# Patient Record
Sex: Female | Born: 1950
Health system: Southern US, Community
[De-identification: ages and names within clinical notes are randomized; demographics above are authoritative.]

## PROBLEM LIST (undated history)

## (undated) DIAGNOSIS — H269 Unspecified cataract: Secondary | ICD-10-CM

## (undated) DIAGNOSIS — M1990 Unspecified osteoarthritis, unspecified site: Secondary | ICD-10-CM

## (undated) DIAGNOSIS — E785 Hyperlipidemia, unspecified: Secondary | ICD-10-CM

## (undated) DIAGNOSIS — E119 Type 2 diabetes mellitus without complications: Secondary | ICD-10-CM

## (undated) DIAGNOSIS — I10 Essential (primary) hypertension: Secondary | ICD-10-CM

## (undated) DIAGNOSIS — E669 Obesity, unspecified: Secondary | ICD-10-CM

## (undated) DIAGNOSIS — H5203 Hypermetropia, bilateral: Secondary | ICD-10-CM

## (undated) DIAGNOSIS — Z789 Other specified health status: Secondary | ICD-10-CM

## (undated) DIAGNOSIS — Z972 Presence of dental prosthetic device (complete) (partial): Secondary | ICD-10-CM

## (undated) DIAGNOSIS — IMO0001 Reserved for inherently not codable concepts without codable children: Secondary | ICD-10-CM

## (undated) HISTORY — PX: CHOLECYSTECTOMY: SHX55

## (undated) HISTORY — DX: Hypermetropia, bilateral: H52.03

## (undated) HISTORY — PX: GALLBLADDER SURGERY: SHX652

## (undated) HISTORY — DX: Obesity, unspecified: E66.9

## (undated) HISTORY — DX: Essential (primary) hypertension: I10

## (undated) HISTORY — PX: OTHER SURGICAL HISTORY: SHX169

## (undated) HISTORY — DX: Type 2 diabetes mellitus without complications: E11.9

## (undated) HISTORY — DX: Unspecified cataract: H26.9

## (undated) HISTORY — PX: TUBAL LIGATION: SHX77

## (undated) HISTORY — PX: TONSILLECTOMY: SUR1361

## (undated) HISTORY — DX: Hyperlipidemia, unspecified: E78.5

## (undated) HISTORY — PX: SEPTOPLASTY: SUR1290

---

## 2004-03-20 ENCOUNTER — Ambulatory Visit: Payer: Self-pay

## 2004-05-01 ENCOUNTER — Ambulatory Visit: Payer: Self-pay

## 2004-05-05 LAB — HM COLONOSCOPY

## 2005-04-03 ENCOUNTER — Ambulatory Visit: Payer: Self-pay | Admitting: Family Medicine

## 2006-07-08 ENCOUNTER — Ambulatory Visit: Payer: Self-pay

## 2007-07-13 ENCOUNTER — Ambulatory Visit: Payer: Self-pay | Admitting: Family Medicine

## 2007-09-15 ENCOUNTER — Ambulatory Visit: Payer: Self-pay

## 2007-09-24 ENCOUNTER — Ambulatory Visit: Payer: Self-pay | Admitting: Unknown Physician Specialty

## 2007-09-28 ENCOUNTER — Ambulatory Visit: Payer: Self-pay | Admitting: Unknown Physician Specialty

## 2007-11-12 ENCOUNTER — Ambulatory Visit: Payer: Self-pay

## 2007-12-15 ENCOUNTER — Ambulatory Visit: Payer: Self-pay | Admitting: Family Medicine

## 2007-12-21 ENCOUNTER — Ambulatory Visit: Payer: Self-pay | Admitting: General Surgery

## 2008-06-28 ENCOUNTER — Ambulatory Visit: Payer: Self-pay | Admitting: Family Medicine

## 2008-12-13 DIAGNOSIS — B354 Tinea corporis: Secondary | ICD-10-CM

## 2009-06-05 DIAGNOSIS — R809 Proteinuria, unspecified: Secondary | ICD-10-CM

## 2009-06-05 DIAGNOSIS — N183 Chronic kidney disease, stage 3 unspecified: Secondary | ICD-10-CM | POA: Insufficient documentation

## 2009-06-05 DIAGNOSIS — E1129 Type 2 diabetes mellitus with other diabetic kidney complication: Secondary | ICD-10-CM

## 2009-07-05 ENCOUNTER — Ambulatory Visit: Payer: Self-pay | Admitting: Family Medicine

## 2009-12-04 ENCOUNTER — Ambulatory Visit: Payer: Self-pay | Admitting: Unknown Physician Specialty

## 2010-06-26 LAB — HM DEXA SCAN: HM DEXA SCAN: NORMAL

## 2010-07-10 ENCOUNTER — Ambulatory Visit: Payer: Self-pay | Admitting: General Practice

## 2011-07-15 ENCOUNTER — Ambulatory Visit: Payer: Self-pay | Admitting: Family Medicine

## 2012-07-27 ENCOUNTER — Ambulatory Visit: Payer: Self-pay | Admitting: Medical

## 2012-10-15 DIAGNOSIS — R809 Proteinuria, unspecified: Secondary | ICD-10-CM | POA: Insufficient documentation

## 2013-08-10 ENCOUNTER — Ambulatory Visit: Payer: Self-pay | Admitting: Medical

## 2013-08-10 LAB — HM MAMMOGRAPHY: HM Mammogram: NORMAL

## 2013-10-17 LAB — HM PAP SMEAR: HM Pap smear: NORMAL

## 2014-05-17 LAB — LIPID PANEL
Cholesterol: 175 mg/dL (ref 0–200)
HDL: 46 mg/dL (ref 35–70)
LDL CALC: 106 mg/dL
Triglycerides: 115 mg/dL (ref 40–160)

## 2014-08-16 ENCOUNTER — Ambulatory Visit
Admit: 2014-08-16 | Disposition: A | Discharge status: Home / Self Care | Payer: Self-pay | Attending: Medical | Admitting: Medical

## 2014-08-18 LAB — HEMOGLOBIN A1C: Hgb A1c MFr Bld: 6.5 % — AB (ref 4.0–6.0)

## 2014-10-12 ENCOUNTER — Telehealth: Payer: Self-pay

## 2014-10-12 NOTE — Telephone Encounter (Signed)
Per pt, her doctor has requested she wait until September.

## 2014-10-12 NOTE — Telephone Encounter (Signed)
-----   Message from Yvonne Curtis sent at 10/09/2014 10:18 AM EDT ----- Regarding: Fwd: Re: Referral - APPDATE - TIME - PROCEDURE From: Yvonne Curtis Sent: 10/06/2014  > From: Yvonne Curtis > To: Yvonne Curtis > Sent: 10/04/2014 9:05 AM > Call pt to schedule colonoscopy. Pt requested summer. GF  > From: Yvonne Curtis > To: Yvonne Curtis > Sent: 05/19/2014 9:05 AM > Spoke with pt. Would like a call back during the summer. Added to recall list to send letter. GF  > From: Curtis, Yvonne > To: Colonoscopy, Triage > Sent: 05/17/2014 9:29 AM > Please call for triage.

## 2014-11-14 ENCOUNTER — Telehealth: Payer: Self-pay

## 2014-11-14 ENCOUNTER — Other Ambulatory Visit: Payer: Self-pay

## 2014-11-14 ENCOUNTER — Encounter: Payer: Self-pay | Admitting: Family Medicine

## 2014-11-14 DIAGNOSIS — E785 Hyperlipidemia, unspecified: Secondary | ICD-10-CM | POA: Insufficient documentation

## 2014-11-14 DIAGNOSIS — M170 Bilateral primary osteoarthritis of knee: Secondary | ICD-10-CM | POA: Insufficient documentation

## 2014-11-14 DIAGNOSIS — L83 Acanthosis nigricans: Secondary | ICD-10-CM | POA: Insufficient documentation

## 2014-11-14 DIAGNOSIS — K219 Gastro-esophageal reflux disease without esophagitis: Secondary | ICD-10-CM | POA: Insufficient documentation

## 2014-11-14 DIAGNOSIS — I10 Essential (primary) hypertension: Secondary | ICD-10-CM | POA: Insufficient documentation

## 2014-11-14 DIAGNOSIS — E668 Other obesity: Secondary | ICD-10-CM | POA: Insufficient documentation

## 2014-11-14 NOTE — Telephone Encounter (Signed)
Gastroenterology Pre-Procedure Review  Request Date: 11-30-14 Requesting Physician: Dr. Ancil Boozer  PATIENT REVIEW QUESTIONS: The patient responded to the following health history questions as indicated:    1. Are you having any GI issues? no 2. Do you have a personal history of Polyps? no 3. Do you have a family history of Colon Cancer or Polyps? yes (mother colon cancer) 4. Diabetes Mellitus? yes (Type 2) 5. Joint replacements in the past 12 months?no 6. Major health problems in the past 3 months?no 7. Any artificial heart valves, MVP, or defibrillator?no    MEDICATIONS & ALLERGIES:    Patient reports the following regarding taking any anticoagulation/antiplatelet therapy:   Plavix, Coumadin, Eliquis, Xarelto, Lovenox, Pradaxa, Brilinta, or Effient? no Aspirin? yes (81mg )  Patient confirms/reports the following medications:  Current Outpatient Prescriptions  Medication Sig Dispense Refill  . acetaminophen (TYLENOL) 500 MG tablet Take by mouth.    Marland Kitchen amLODipine (NORVASC) 10 MG tablet Take by mouth.    Marland Kitchen atorvastatin (LIPITOR) 40 MG tablet Take by mouth.    . Cholecalciferol (VITAMIN D3) 2000 UNITS capsule VITAMIN D3, 2000UNIT (Oral Capsule)  2 po qday for 0 days  Quantity: 60.00;  Refills: 0   Ordered :15-May-2010  Steele Sizer MD;  Buddy Duty 05-October-2008 Active    . fluticasone (FLONASE) 50 MCG/ACT nasal spray Place into the nose.    Marland Kitchen glucose blood (NOVA MAX TEST) test strip NOVA MAX GLUCOSE TEST (In Vitro Strip)  check fsbs qam for 0 days  Quantity: 100.00;  Refills: 0   Ordered :15-May-2010  Steele Sizer MD;  Adele Barthel Active Comments: DX: 250.00    . metFORMIN (GLUCOPHAGE) 1000 MG tablet Take by mouth.    . telmisartan-hydrochlorothiazide (MICARDIS HCT) 80-25 MG per tablet Take by mouth.     No current facility-administered medications for this visit.    Patient confirms/reports the following allergies:  Allergies  Allergen Reactions  . Oxycodone Hcl    insomnia, agitation    No orders of the defined types were placed in this encounter.    AUTHORIZATION INFORMATION Primary Insurance: 1D#: Group #:  Secondary Insurance: 1D#: Group #:  SCHEDULE INFORMATION: Date: 11-30-14 Time: Location: [facility]

## 2014-11-17 ENCOUNTER — Encounter: Payer: Self-pay | Admitting: Family Medicine

## 2014-11-17 ENCOUNTER — Ambulatory Visit (INDEPENDENT_AMBULATORY_CARE_PROVIDER_SITE_OTHER): Payer: BLUE CROSS/BLUE SHIELD | Admitting: Family Medicine

## 2014-11-17 VITALS — BP 118/64 | HR 100 | Temp 98.6°F | Resp 16 | Ht 61.0 in | Wt 222.4 lb

## 2014-11-17 DIAGNOSIS — I10 Essential (primary) hypertension: Secondary | ICD-10-CM | POA: Diagnosis not present

## 2014-11-17 DIAGNOSIS — E1129 Type 2 diabetes mellitus with other diabetic kidney complication: Secondary | ICD-10-CM

## 2014-11-17 DIAGNOSIS — E668 Other obesity: Secondary | ICD-10-CM

## 2014-11-17 DIAGNOSIS — E785 Hyperlipidemia, unspecified: Secondary | ICD-10-CM

## 2014-11-17 DIAGNOSIS — E1121 Type 2 diabetes mellitus with diabetic nephropathy: Secondary | ICD-10-CM

## 2014-11-17 DIAGNOSIS — R809 Proteinuria, unspecified: Secondary | ICD-10-CM | POA: Diagnosis not present

## 2014-11-17 LAB — POCT UA - MICROALBUMIN: MICROALBUMIN (UR) POC: 20 mg/L

## 2014-11-17 LAB — GLUCOSE, POCT (MANUAL RESULT ENTRY): POC GLUCOSE: 103 mg/dL — AB (ref 70–99)

## 2014-11-17 LAB — POCT GLYCOSYLATED HEMOGLOBIN (HGB A1C): HEMOGLOBIN A1C: 6.1

## 2014-11-17 MED ORDER — AMLODIPINE BESYLATE 5 MG PO TABS
10.0000 mg | ORAL_TABLET | Freq: Every day | ORAL | Status: DC
Start: 2014-11-17 — End: 2015-01-11

## 2014-11-17 MED ORDER — ATORVASTATIN CALCIUM 40 MG PO TABS: 40.0000 mg | ORAL_TABLET | Freq: Every day | ORAL | Status: DC

## 2014-11-17 MED ORDER — TELMISARTAN-HCTZ 80-25 MG PO TABS: 1.0000 | ORAL_TABLET | Freq: Every day | ORAL | Status: DC

## 2014-11-17 MED ORDER — METFORMIN HCL 500 MG PO TABS: 500.0000 mg | ORAL_TABLET | Freq: Every evening | ORAL | Status: DC

## 2014-11-17 NOTE — Patient Instructions (Signed)
Take only 5 mg of Amlodipine and monitor bp at home to make sure it is not going up

## 2014-11-17 NOTE — Progress Notes (Signed)
Name: Yvonne Curtis   MRN: [id number]    DOB: April 13, 1951   Date:11/17/2014       Progress Note  Subjective  Chief Complaint  Chief Complaint  Patient presents with  . Hypertension  . Diabetes  . Hyperlipidemia  . Obesity    HPI  DMII with renal manifestation: she has been taking Metformin daily and denies side effects, glucose at home is around 100's in the mornings. She denies polyphagia, polyuria or polydipsia.  She is not up to date with eye exam, urine micro today within normal limits  HTN: she has been taking her medications as prescribed and denies side effects. BP today towards low end of normal with slight tachycardia, no dizziness or syncope.  No chest pain or palpitation  Hyperlipidemia: last labs were done 6 months ago. Taking Lipitor and denies side effects  Obesity: she was [place] but because of cost only taking prn , and will stop once current bottle runs out. Discussed life style modification  Patient Active Problem List   Diagnosis Date Noted  . Acanthosis nigricans 11/14/2014  . Benign hypertension 11/14/2014  . Dyslipidemia 11/14/2014  . Gastro-esophageal reflux disease without esophagitis 11/14/2014  . Extreme obesity 11/14/2014  . Primary osteoarthritis of both knees 11/14/2014  . Microalbuminuria 10/15/2012  . Diabetes mellitus with renal manifestations, controlled 06/05/2009  . Body dermatophytosis 12/13/2008    Past Surgical History  Procedure Laterality Date  . Tubal ligation    . Septoplasty    . Tonsillectomy    . Gallbladder surgery      Family History  Problem Relation Age of Onset  . Colon cancer Mother   . Lung cancer Father   . Hypertension Sister   . Hypertension Sister     History   Social History  . Marital Status: Married    Spouse Name: N/A  . Number of Children: N/A  . Years of Education: N/A   Occupational History  . Not on file.   Social History Main Topics  . Smoking status: Never Smoker   . Smokeless  tobacco: Not on file  . Alcohol Use: No  . Drug Use: No  . Sexual Activity: Not on file   Other Topics Concern  . Not on file   Social History Narrative  . No narrative on file     Current outpatient prescriptions:  .  acetaminophen (TYLENOL) 500 MG tablet, Take by mouth., Disp: , Rfl:  .  amLODipine (NORVASC) 5 MG tablet, Take 2 tablets (10 mg total) by mouth daily., Disp: 90 tablet, Rfl: 1 .  aspirin EC 81 MG tablet, Take by mouth., Disp: , Rfl:  .  atorvastatin (LIPITOR) 40 MG tablet, Take 1 tablet (40 mg total) by mouth daily., Disp: 90 tablet, Rfl: 1 .  Cholecalciferol (VITAMIN D3) 2000 UNITS capsule, VITAMIN D3, 2000UNIT (Oral Capsule)  2 po qday for 0 days  Quantity: 60.00;  Refills: 0   Ordered :15-May-2010  Steele Sizer MD;  Started 05-October-2008 Active, Disp: , Rfl:  .  fluticasone (FLONASE) 50 MCG/ACT nasal spray, Place into the nose., Disp: , Rfl:  .  glucose blood (NOVA MAX TEST) test strip, NOVA MAX GLUCOSE TEST (In Vitro Strip)  check fsbs qam for 0 days  Quantity: 100.00;  Refills: 0   Ordered :15-May-2010  Steele Sizer MD;  Adele Barthel Active Comments: DX: 250.00, Disp: , Rfl:  .  metFORMIN (GLUCOPHAGE) 500 MG tablet, Take 1 tablet (500 mg total) by mouth every evening.,  Disp: 90 tablet, Rfl: 1 .  telmisartan-hydrochlorothiazide (MICARDIS HCT) 80-25 MG per tablet, Take 1 tablet by mouth daily., Disp: 90 tablet, Rfl: 1  Allergies  Allergen Reactions  . Oxycodone Other (See Comments)  . Oxycodone Hcl     insomnia, agitation     ROS  Constitutional: Negative for fever or weight change.  Respiratory: Negative for cough and shortness of breath.   Cardiovascular: Negative for chest pain or palpitations.  Gastrointestinal: Negative for abdominal pain, no bowel changes.  Musculoskeletal: Negative for gait problem or joint swelling.  Skin: Negative for rash.  Neurological: Negative for dizziness or headache.  No other specific complaints in a complete  review of systems (except as listed in HPI above).  Objective  Filed Vitals:   11/17/14 0854  BP: 118/64  Pulse: 100  Temp: 98.6 F (37 C)  Resp: 16  Height: 5\' 1"  (1.549 m)  Weight: 222 lb 6 oz (100.869 kg)  SpO2: 94%    Body mass index is 42.04 kg/(m^2).  Physical Exam  Constitutional: Patient appears well-developed and well-nourished. No distress. Obesity Eyes:  No scleral icterus. PERL Neck: Normal range of motion. Neck supple. Cardiovascular: Normal rate, regular rhythm and normal heart sounds.  No murmur heard. Trace  BLE edema. Pulmonary/Chest: Effort normal and breath sounds normal. No respiratory distress. Abdominal: Soft.  There is no tenderness. Psychiatric: Patient has a normal mood and affect. behavior is normal. Judgment and thought content normal.  Recent Results (from the past 2160 hour(s))  POCT Glucose (CBG)     Status: Abnormal   Collection Time: 11/17/14  9:04 AM  Result Value Ref Range   POC Glucose 103 (A) 70 - 99 mg/dl  POCT HgB A1C     Status: None   Collection Time: 11/17/14  9:04 AM  Result Value Ref Range   Hemoglobin A1C 6.1   POCT UA - Microalbumin     Status: None   Collection Time: 11/17/14  9:04 AM  Result Value Ref Range   Microalbumin Ur, POC 20 mg/L   Creatinine, POC  mg/dL   Albumin/Creatinine Ratio, Urine, POC      Diabetic Foot Exam: Diabetic Foot Exam - Simple   Simple Foot Form  Visual Inspection  No deformities, no ulcerations, no other skin breakdown bilaterally:  Yes  Sensation Testing  Intact to touch and monofilament testing bilaterally:  Yes  Pulse Check  Posterior Tibialis and Dorsalis pulse intact bilaterally:  Yes  Comments       PHQ2/9: Depression screen PHQ 2/9 11/17/2014  Decreased Interest 0  Down, Depressed, Hopeless 0  PHQ - 2 Score 0    Fall Risk: Fall Risk  11/17/2014  Falls in the past year? No      Assessment & Plan  1. Diabetes mellitus with renal manifestations, controlled  - POCT  Glucose (CBG) - POCT HgB A1C - POCT UA - Microalbumin - telmisartan-hydrochlorothiazide (MICARDIS HCT) 80-25 MG per tablet; Take 1 tablet by mouth daily.  Dispense: 90 tablet; Refill: 1  2. Microalbuminuria within normal limits   3. Dyslipidemia Continue medication  - atorvastatin (LIPITOR) 40 MG tablet; Take 1 tablet (40 mg total) by mouth daily.  Dispense: 90 tablet; Refill: 1  4. Benign hypertension  - amLODipine (NORVASC) 5 MG tablet; Take 2 tablets (10 mg total) by mouth daily.  Dispense: 90 tablet; Refill: 1 - metFORMIN (GLUCOPHAGE) 500 MG tablet; Take 1 tablet (500 mg total) by mouth every evening.  Dispense: 90 tablet; Refill:  1 Changed dose of Norvasc because bp is towards low end of normal , monitor at home and call back if bp goes up  5. Extreme obesity Explained that she needs to change her diet to lose weight. She still eats sweets and drinks sweet tea daily. Advised to stop drinking sweet beverages

## 2014-11-20 ENCOUNTER — Encounter: Payer: Self-pay | Admitting: *Deleted

## 2014-11-29 NOTE — Discharge Instructions (Signed)

## 2014-11-30 ENCOUNTER — Ambulatory Visit: Payer: BLUE CROSS/BLUE SHIELD | Admitting: Anesthesiology

## 2014-11-30 ENCOUNTER — Encounter
Admission: RE | Disposition: A | Discharge status: Home / Self Care | Payer: Self-pay | Source: Ambulatory Visit | Attending: Gastroenterology

## 2014-11-30 ENCOUNTER — Ambulatory Visit
Admission: RE | Admit: 2014-11-30 | Discharge: 2014-11-30 | Disposition: A | Discharge status: Home / Self Care | Payer: BLUE CROSS/BLUE SHIELD | Source: Ambulatory Visit | Attending: Gastroenterology | Admitting: Gastroenterology

## 2014-11-30 ENCOUNTER — Other Ambulatory Visit: Payer: Self-pay | Admitting: Gastroenterology

## 2014-11-30 DIAGNOSIS — Z800 Family history of malignant neoplasm of digestive organs: Secondary | ICD-10-CM | POA: Insufficient documentation

## 2014-11-30 DIAGNOSIS — Z9049 Acquired absence of other specified parts of digestive tract: Secondary | ICD-10-CM | POA: Diagnosis not present

## 2014-11-30 DIAGNOSIS — E119 Type 2 diabetes mellitus without complications: Secondary | ICD-10-CM | POA: Diagnosis not present

## 2014-11-30 DIAGNOSIS — D124 Benign neoplasm of descending colon: Secondary | ICD-10-CM | POA: Diagnosis not present

## 2014-11-30 DIAGNOSIS — M13862 Other specified arthritis, left knee: Secondary | ICD-10-CM | POA: Diagnosis not present

## 2014-11-30 DIAGNOSIS — Z9889 Other specified postprocedural states: Secondary | ICD-10-CM | POA: Insufficient documentation

## 2014-11-30 DIAGNOSIS — M13861 Other specified arthritis, right knee: Secondary | ICD-10-CM | POA: Diagnosis not present

## 2014-11-30 DIAGNOSIS — Z7951 Long term (current) use of inhaled steroids: Secondary | ICD-10-CM | POA: Insufficient documentation

## 2014-11-30 DIAGNOSIS — Z8249 Family history of ischemic heart disease and other diseases of the circulatory system: Secondary | ICD-10-CM | POA: Insufficient documentation

## 2014-11-30 DIAGNOSIS — D123 Benign neoplasm of transverse colon: Secondary | ICD-10-CM | POA: Insufficient documentation

## 2014-11-30 DIAGNOSIS — Z79899 Other long term (current) drug therapy: Secondary | ICD-10-CM | POA: Diagnosis not present

## 2014-11-30 DIAGNOSIS — Z7982 Long term (current) use of aspirin: Secondary | ICD-10-CM | POA: Diagnosis not present

## 2014-11-30 DIAGNOSIS — E785 Hyperlipidemia, unspecified: Secondary | ICD-10-CM | POA: Diagnosis not present

## 2014-11-30 DIAGNOSIS — Z885 Allergy status to narcotic agent status: Secondary | ICD-10-CM | POA: Diagnosis not present

## 2014-11-30 DIAGNOSIS — Z1211 Encounter for screening for malignant neoplasm of colon: Secondary | ICD-10-CM | POA: Diagnosis not present

## 2014-11-30 DIAGNOSIS — K640 First degree hemorrhoids: Secondary | ICD-10-CM | POA: Insufficient documentation

## 2014-11-30 DIAGNOSIS — Z801 Family history of malignant neoplasm of trachea, bronchus and lung: Secondary | ICD-10-CM | POA: Diagnosis not present

## 2014-11-30 DIAGNOSIS — I10 Essential (primary) hypertension: Secondary | ICD-10-CM | POA: Insufficient documentation

## 2014-11-30 HISTORY — DX: Unspecified osteoarthritis, unspecified site: M19.90

## 2014-11-30 HISTORY — DX: Other specified health status: Z78.9

## 2014-11-30 HISTORY — DX: Presence of dental prosthetic device (complete) (partial): Z97.2

## 2014-11-30 HISTORY — PX: COLONOSCOPY WITH PROPOFOL: SHX5780

## 2014-11-30 HISTORY — DX: Reserved for inherently not codable concepts without codable children: IMO0001

## 2014-11-30 LAB — GLUCOSE, CAPILLARY
GLUCOSE-CAPILLARY: 87 mg/dL (ref 65–99)
Glucose-Capillary: 78 mg/dL (ref 65–99)

## 2014-11-30 SURGERY — COLONOSCOPY WITH PROPOFOL
Anesthesia: Monitor Anesthesia Care | Wound class: Contaminated

## 2014-11-30 MED ORDER — LACTATED RINGERS IV SOLN
INTRAVENOUS | Status: DC
  Administered 2014-11-30: 07:00:00 via INTRAVENOUS

## 2014-11-30 MED ORDER — PROPOFOL 10 MG/ML IV BOLUS
INTRAVENOUS | Status: DC | PRN
  Administered 2014-11-30: 50 mg via INTRAVENOUS
  Administered 2014-11-30: 20 mg via INTRAVENOUS
  Administered 2014-11-30 (×2): 30 mg via INTRAVENOUS
  Administered 2014-11-30 (×2): 10 mg via INTRAVENOUS

## 2014-11-30 MED ORDER — LIDOCAINE HCL (CARDIAC) 20 MG/ML IV SOLN
INTRAVENOUS | Status: DC | PRN
  Administered 2014-11-30: 50 mg via INTRAVENOUS

## 2014-11-30 MED ORDER — SODIUM CHLORIDE 0.9 % IV SOLN: INTRAVENOUS | Status: DC

## 2014-11-30 MED ORDER — STERILE WATER FOR IRRIGATION IR SOLN
Status: DC | PRN
  Administered 2014-11-30: 08:00:00

## 2014-11-30 SURGICAL SUPPLY — 28 items
CANISTER SUCT 1200ML W/VALVE (MISCELLANEOUS) ×3 IMPLANT
FCP ESCP3.2XJMB 240X2.8X (MISCELLANEOUS)
FORCEPS BIOP RAD 4 LRG CAP 4 (CUTTING FORCEPS) ×3 IMPLANT
FORCEPS BIOP RJ4 240 W/NDL (MISCELLANEOUS)
FORCEPS ESCP3.2XJMB 240X2.8X (MISCELLANEOUS) IMPLANT
GOWN CVR UNV OPN BCK APRN NK (MISCELLANEOUS) ×2 IMPLANT
GOWN ISOL THUMB LOOP REG UNIV (MISCELLANEOUS) ×4
HEMOCLIP INSTINCT (CLIP) IMPLANT
INJECTOR VARIJECT VIN23 (MISCELLANEOUS) IMPLANT
KIT CO2 TUBING (TUBING) IMPLANT
KIT DEFENDO VALVE AND CONN (KITS) IMPLANT
KIT ENDO PROCEDURE OLY (KITS) ×3 IMPLANT
LIGATOR MULTIBAND 6SHOOTER MBL (MISCELLANEOUS) IMPLANT
MARKER SPOT ENDO TATTOO 5ML (MISCELLANEOUS) IMPLANT
PAD GROUND ADULT SPLIT (MISCELLANEOUS) IMPLANT
SNARE SHORT THROW 13M SML OVAL (MISCELLANEOUS) IMPLANT
SNARE SHORT THROW 30M LRG OVAL (MISCELLANEOUS) IMPLANT
SPOT EX ENDOSCOPIC TATTOO (MISCELLANEOUS)
SUCTION POLY TRAP 4CHAMBER (MISCELLANEOUS) IMPLANT
TRAP SUCTION POLY (MISCELLANEOUS) IMPLANT
TUBING CONN 6MMX3.1M (TUBING)
TUBING SUCTION CONN 0.25 STRL (TUBING) IMPLANT
UNDERPAD 30X60 958B10 (PK) (MISCELLANEOUS) IMPLANT
VALVE BIOPSY ENDO (VALVE) IMPLANT
VARIJECT INJECTOR VIN23 (MISCELLANEOUS)
WATER AUXILLARY (MISCELLANEOUS) IMPLANT
WATER STERILE IRR 250ML POUR (IV SOLUTION) ×3 IMPLANT
WATER STERILE IRR 500ML POUR (IV SOLUTION) IMPLANT

## 2014-11-30 NOTE — Anesthesia Postprocedure Evaluation (Signed)
  Anesthesia Post-op Note  Patient: Yvonne Curtis  Procedure(s) Performed: Procedure(s) with comments: COLONOSCOPY WITH PROPOFOL (N/A) - WITH BIOPSY-- TRANSVERSE COLON POLYP  X 2 DESCENING COLON POLYP  Anesthesia type:MAC  Patient location: PACU  Post pain: Pain level controlled  Post assessment: Post-op Vital signs reviewed, Patient's Cardiovascular Status Stable, Respiratory Function Stable, Patent Airway and No signs of Nausea or vomiting  Post vital signs: Reviewed and stable  Last Vitals:  Filed Vitals:   11/30/14 0832  BP: 132/73  Pulse: 72  Temp: 36.3 C  Resp: 16    Level of consciousness: awake, alert  and patient cooperative  Complications: No apparent anesthesia complications

## 2014-11-30 NOTE — Anesthesia Procedure Notes (Signed)
Procedure Name: MAC Performed by: Ramon Zanders Pre-anesthesia Checklist: Patient identified, Emergency Drugs available, Suction available, Timeout performed and Patient being monitored Patient Re-evaluated:Patient Re-evaluated prior to inductionOxygen Delivery Method: Nasal cannula Placement Confirmation: positive ETCO2       

## 2014-11-30 NOTE — H&P (Signed)
[facility]  [address] [place], [place] [address] Phone: [phone] Fax : [phone]  Primary Care Physician:  Loistine Chance, MD Primary Gastroenterologist:  Dr. Allen Norris  Pre-Procedure History & Physical: HPI:  Yvonne Curtis is a 64 y.o. female is here for a screening colonoscopy.   Past Medical History  Diagnosis Date  . Hyperlipidemia   . Hypertension   . Diabetes mellitus without complication   . Obesity   . Arthritis     knees  . Wears dentures     full upper  . Patient is Jehov[employer]'s Witness     declines blood products    Past Surgical History  Procedure Laterality Date  . Tubal ligation    . Septoplasty    . Tonsillectomy    . Gallbladder surgery      Prior to Admission medications   Medication Sig Start Date End Date Taking? Authorizing Provider  acetaminophen (TYLENOL) 500 MG tablet Take by mouth. 10/15/12  Yes Historical Provider, MD  amLODipine (NORVASC) 5 MG tablet Take 2 tablets (10 mg total) by mouth daily. 11/17/14  Yes Steele Sizer, MD  aspirin EC 81 MG tablet Take by mouth.   Yes Historical Provider, MD  atorvastatin (LIPITOR) 40 MG tablet Take 1 tablet (40 mg total) by mouth daily. 11/17/14  Yes Steele Sizer, MD  Cholecalciferol (VITAMIN D3) 2000 UNITS capsule VITAMIN D3, 2000UNIT (Oral Capsule)  2 po qday for 0 days  Quantity: 60.00;  Refills: 0   Ordered :15-May-2010  Steele Sizer MD;  Started 05-October-2008 Active 10/05/08  Yes Historical Provider, MD  fluticasone (FLONASE) 50 MCG/ACT nasal spray Place into the nose. 07/17/14  Yes Historical Provider, MD  metFORMIN (GLUCOPHAGE) 500 MG tablet Take 1 tablet (500 mg total) by mouth every evening. 11/17/14  Yes Steele Sizer, MD  telmisartan-hydrochlorothiazide (MICARDIS HCT) 80-25 MG per tablet Take 1 tablet by mouth daily. 11/17/14  Yes Steele Sizer, MD  glucose blood (NOVA MAX TEST) test strip NOVA MAX GLUCOSE TEST (In Vitro Strip)  check fsbs qam for 0 days  Quantity: 100.00;  Refills: 0   Ordered :15-May-2010  Steele Sizer MD;  Buddy Duty 05-Jun-2009 Active Comments: DX: 250.00 06/05/09   Historical Provider, MD    Allergies as of 11/14/2014 - never reviewed  Allergen Reaction Noted  . Oxycodone hcl  11/14/2014    Family History  Problem Relation Age of Onset  . Colon cancer Mother   . Lung cancer Father   . Hypertension Sister   . Hypertension Sister     History   Social History  . Marital Status: Married    Spouse Name: N/A  . Number of Children: N/A  . Years of Education: N/A   Occupational History  . Not on file.   Social History Main Topics  . Smoking status: Never Smoker   . Smokeless tobacco: Not on file  . Alcohol Use: No  . Drug Use: No  . Sexual Activity: Not on file   Other Topics Concern  . Not on file   Social History Narrative    Review of Systems: See HPI, otherwise negative ROS  Physical Exam: BP 141/78 mmHg  Pulse 89  Temp(Src) 97.9 F (36.6 C) (Temporal)  Resp 18  Ht 5' (1.524 m)  Wt 222 lb (100.699 kg)  BMI 43.36 kg/m2  SpO2 98% General:   Alert,  pleasant and cooperative in NAD Head:  Normocephalic and atraumatic. Neck:  Supple; no masses or thyromegaly. Lungs:  Clear throughout to auscultation.    Heart:  Regular rate and rhythm. Abdomen:  Soft, nontender and nondistended. Normal bowel sounds, without guarding, and without rebound.   Neurologic:  Alert and  oriented x4;  grossly normal neurologically.  Impression/Plan: Yvonne Curtis is now here to undergo a screening colonoscopy.  Risks, benefits, and alternatives regarding colonoscopy have been reviewed with the patient.  Questions have been answered.  All parties agreeable.

## 2014-11-30 NOTE — Transfer of Care (Signed)
Immediate Anesthesia Transfer of Care Note  Patient: Yvonne Curtis  Procedure(s) Performed: Procedure(s) with comments: COLONOSCOPY WITH PROPOFOL (N/A) - WITH BIOPSY-- TRANSVERSE COLON POLYP  X 2 [place] COLON POLYP  Patient Location: PACU  Anesthesia Type: MAC  Level of Consciousness: awake, alert  and patient cooperative  Airway and Oxygen Therapy: Patient Spontanous Breathing and Patient connected to supplemental oxygen  Post-op Assessment: Post-op Vital signs reviewed, Patient's Cardiovascular Status Stable, Respiratory Function Stable, Patent Airway and No signs of Nausea or vomiting  Post-op Vital Signs: Reviewed and stable  Complications: No apparent anesthesia complications

## 2014-11-30 NOTE — Anesthesia Preprocedure Evaluation (Signed)
Anesthesia Evaluation  Patient identified by MRN, date of birth, ID band Patient awake    Reviewed: Allergy & Precautions, H&P , NPO status , Patient's Chart, lab work & pertinent test results, reviewed documented beta blocker date and time   Airway Mallampati: II  TM Distance: >3 FB Neck ROM: full    Dental no notable dental hx. (+) Upper Dentures   Pulmonary neg pulmonary ROS,  breath sounds clear to auscultation  Pulmonary exam normal       Cardiovascular Exercise Tolerance: Good hypertension, Rhythm:regular Rate:Normal     Neuro/Psych negative neurological ROS  negative psych ROS   GI/Hepatic Neg liver ROS, GERD-  ,  Endo/Other  diabetes, Oral Hypoglycemic Agents  Renal/GU negative Renal ROS  negative genitourinary   Musculoskeletal   Abdominal   Peds  Hematology  (+) [facility]'S WITNESS  Anesthesia Other Findings   Reproductive/Obstetrics negative OB ROS                             Anesthesia Physical Anesthesia Plan  ASA: III  Anesthesia Plan: MAC   Post-op Pain Management:    Induction:   Airway Management Planned:   Additional Equipment:   Intra-op Plan:   Post-operative Plan:   Informed Consent: I have reviewed the patients History and Physical, chart, labs and discussed the procedure including the risks, benefits and alternatives for the proposed anesthesia with the patient or authorized representative who has indicated his/her understanding and acceptance.     Plan Discussed with: CRNA  Anesthesia Plan Comments:         Anesthesia Quick Evaluation

## 2014-11-30 NOTE — Op Note (Signed)
[facility]astroenter[facility] Patient Name: Yvonne Curtis Procedure Date: 11/30/2014 8:07 AM MRN: [id number] Account #: [id number] Date of Birth: 03-15-1951 Admit Type: Outpatient Age: 64 Room: [facility] OR ROOM 01 Gender: Female Note Status: Finalized Procedure:         Colonoscopy Indications:       Screening for colorectal malignant neoplasm Providers:         Lucilla Lame, MD Referring MD:      Bethena Roys. Sowles, MD (Referring MD) Medicines:         Propofol per Anesthesia Complications:     No immediate complications. Procedure:         Pre-Anesthesia Assessment:                    - Prior to the procedure, a History and Physical was                     performed, and patient medications and allergies were                     reviewed. The patient's tolerance of previous anesthesia                     was also reviewed. The risks and benefits of the procedure                     and the sedation options and risks were discussed with the                     patient. All questions were answered, and informed consent                     was obtained. Prior Anticoagulants: The patient has taken                     no previous anticoagulant or antiplatelet agents. ASA                     Grade Assessment: II - A patient with mild systemic                     disease. After reviewing the risks and benefits, the                     patient was deemed in satisfactory condition to undergo                     the procedure.                    After obtaining informed consent, the colonoscope was                     passed under direct vision. Throughout the procedure, the                     patient's blood pressure, pulse, and oxygen saturations                     were monitored continuously. The Olympus CF H180AL                     colonoscope (S#: [id number]) was introduced through the anus  and advanced to the the cecum, identified by appendiceal                   orifice and ileocecal valve. The colonoscopy was performed                     without difficulty. The patient tolerated the procedure                     well. The quality of the bowel preparation was good. Findings:      The perianal and digital rectal examinations were normal.      Two sessile polyps were found in the transverse colon. The polyps were 3       to 4 mm in size. These polyps were removed with a cold biopsy forceps.       Resection and retrieval were complete.      A 4 mm polyp was found in the descending colon. The polyp was sessile.       The polyp was removed with a cold biopsy forceps. Resection and       retrieval were complete.      Non-bleeding internal hemorrhoids were found during retroflexion. The       hemorrhoids were Grade I (internal hemorrhoids that do not prolapse). Impression:        - Two 3 to 4 mm polyps in the transverse colon. Resected                     and retrieved.                    - One 4 mm polyp in the descending colon. Resected and                     retrieved.                    - Non-bleeding internal hemorrhoids. Recommendation:    - Await pathology results.                    - Repeat colonoscopy in 5 years if polyp adenoma and 10                     years if hyperplastic Procedure Code(s): --- Professional ---                    [phone], Colonoscopy, flexible; with biopsy, single or                     multiple Diagnosis Code(s): --- Professional ---                    Z12.11, Encounter for screening for malignant neoplasm of                     colon                    D12.3, Benign neoplasm of transverse colon                    D12.4, Benign neoplasm of descending colon CPT copyright 2014 American Medical Association. All rights reserved. The codes documented in this report are preliminary and upon coder review may  be revised to meet current compliance requirements. Lucilla Lame, MD 11/30/2014 8:30:38 AM This  report has  been signed electronically. Number of Addenda: 0 Note Initiated On: 11/30/2014 8:07 AM Scope Withdrawal Time: 0 hours 7 minutes 21 seconds  Total Procedure Duration: 0 hours 10 minutes 2 seconds       [facility]

## 2014-12-01 ENCOUNTER — Encounter: Payer: Self-pay | Admitting: Gastroenterology

## 2014-12-05 ENCOUNTER — Encounter: Payer: Self-pay | Admitting: Gastroenterology

## 2014-12-29 ENCOUNTER — Other Ambulatory Visit: Payer: Self-pay | Admitting: Family Medicine

## 2015-01-10 ENCOUNTER — Telehealth: Payer: Self-pay | Admitting: Family Medicine

## 2015-01-10 NOTE — Telephone Encounter (Signed)
NEEDS LAB SLIP

## 2015-01-10 NOTE — Telephone Encounter (Signed)
Labs

## 2015-01-11 ENCOUNTER — Other Ambulatory Visit: Payer: Self-pay

## 2015-01-11 DIAGNOSIS — I10 Essential (primary) hypertension: Secondary | ICD-10-CM

## 2015-01-11 MED ORDER — AMLODIPINE BESYLATE 10 MG PO TABS: 10.0000 mg | ORAL_TABLET | Freq: Every day | ORAL | Status: DC

## 2015-01-12 LAB — HM DIABETES EYE EXAM

## 2015-01-17 ENCOUNTER — Encounter: Payer: Self-pay | Admitting: Family Medicine

## 2015-01-17 ENCOUNTER — Ambulatory Visit (INDEPENDENT_AMBULATORY_CARE_PROVIDER_SITE_OTHER): Payer: PPO | Admitting: Family Medicine

## 2015-01-17 VITALS — BP 122/74 | HR 106 | Temp 98.1°F | Resp 16 | Ht 61.0 in | Wt 225.4 lb

## 2015-01-17 DIAGNOSIS — I10 Essential (primary) hypertension: Secondary | ICD-10-CM

## 2015-01-17 DIAGNOSIS — Z1239 Encounter for other screening for malignant neoplasm of breast: Secondary | ICD-10-CM

## 2015-01-17 DIAGNOSIS — Z13820 Encounter for screening for osteoporosis: Secondary | ICD-10-CM

## 2015-01-17 DIAGNOSIS — Z23 Encounter for immunization: Secondary | ICD-10-CM | POA: Diagnosis not present

## 2015-01-17 DIAGNOSIS — Z124 Encounter for screening for malignant neoplasm of cervix: Secondary | ICD-10-CM

## 2015-01-17 DIAGNOSIS — Z0000 Encounter for general adult medical examination without abnormal findings: Secondary | ICD-10-CM | POA: Diagnosis not present

## 2015-01-17 NOTE — Progress Notes (Signed)
Name: Yvonne Curtis   MRN: [id number]    DOB: June 19, 1950   Date:01/17/2015       Progress Note  Subjective  Chief Complaint  Chief Complaint  Patient presents with  . Annual Exam    HPI  Functional ability/safety issues: No Issues Hearing issues: Addressed  Activities of daily living: Discussed Home safety issues: No Issues  End Of Life Planning: Offered verbal information regarding advanced directives, healthcare power of attorney.  Preventative care, Health maintenance, Preventative health measures discussed.  Preventative screenings discussed today: lab work, colonoscopy, PAP, mammogram, DEXA.  Low Dose CT Chest recommended if Age 59-80 years, 30 pack-year currently smoking OR have quit w/in 15years.   Lifestyle risk factor issued reviewed: Diet, exercise, weight management, advised patient smoking is not healthy, nutrition/diet.  Preventative health measures discussed (5-10 year plan).  Reviewed and recommended vaccinations: - Pneumovax  - Prevnar  - Annual Influenza - Zostavax - Tdap   Depression screening: Done Fall risk screening: Done Discuss ADLs/IADLs: Done  Current medical providers: See HPI  Other health risk factors identified this visit: No other issues Cognitive impairment issues: None identified  All above discussed with patient. Appropriate education, counseling and referral will be made based upon the above.   HTN: taking medication as prescribed, no chest pain or palpitation, bp has been goal, we will check an EKG today. BP at local pharmacy around 120's/70's, no orthostatic symptoms   Patient Active Problem List   Diagnosis Date Noted  . Acanthosis nigricans 11/14/2014  . Benign hypertension 11/14/2014  . Dyslipidemia 11/14/2014  . Gastro-esophageal reflux disease without esophagitis 11/14/2014  . Extreme obesity 11/14/2014  . Primary osteoarthritis of both knees 11/14/2014  . Microalbuminuria 10/15/2012  . Diabetes mellitus with  renal manifestations, controlled 06/05/2009  . Body dermatophytosis 12/13/2008    Past Surgical History  Procedure Laterality Date  . Tubal ligation    . Septoplasty    . Tonsillectomy    . Gallbladder surgery    . Colonoscopy with propofol N/A 11/30/2014    Procedure: COLONOSCOPY WITH PROPOFOL;  Surgeon: Lucilla Lame, MD;  Location: [place];  Service: Endoscopy;  Laterality: N/A;  WITH BIOPSY-- TRANSVERSE COLON POLYP  X 2 DESCENING COLON POLYP    Family History  Problem Relation Age of Onset  . Colon cancer Mother   . Lung cancer Father   . Hypertension Sister   . Hypertension Sister     Social History   Social History  . Marital Status: Married    Spouse Name: N/A  . Number of Children: N/A  . Years of Education: N/A   Occupational History  . Not on file.   Social History Main Topics  . Smoking status: Never Smoker   . Smokeless tobacco: Never Used  . Alcohol Use: No  . Drug Use: No  . Sexual Activity: Yes   Other Topics Concern  . Not on file   Social History Narrative     Current outpatient prescriptions:  .  acetaminophen (TYLENOL) 500 MG tablet, Take by mouth., Disp: , Rfl:  .  amLODipine (NORVASC) 10 MG tablet, Take 1 tablet (10 mg total) by mouth daily., Disp: 90 tablet, Rfl: 1 .  aspirin EC 81 MG tablet, Take by mouth., Disp: , Rfl:  .  atorvastatin (LIPITOR) 40 MG tablet, TAKE ONE TABLET BY MOUTH ONCE DAILY FOR  CHOLESTEROL, Disp: 90 tablet, Rfl: 1 .  Cholecalciferol (VITAMIN D3) 2000 UNITS capsule, VITAMIN D3, 2000UNIT (Oral Capsule)  2 po qday for 0 days  Quantity: 60.00;  Refills: 0   Ordered :15-May-2010  Steele Sizer MD;  Started 05-October-2008 Active, Disp: , Rfl:  .  fluticasone (FLONASE) 50 MCG/ACT nasal spray, Place into the nose., Disp: , Rfl:  .  glucose blood (NOVA MAX TEST) test strip, NOVA MAX GLUCOSE TEST (In Vitro Strip)  check fsbs qam for 0 days  Quantity: 100.00;  Refills: 0   Ordered :15-May-2010  Steele Sizer MD;   Adele Barthel Active Comments: DX: 250.00, Disp: , Rfl:  .  loratadine (ALLERGY) 10 MG dissolvable tablet, Take 1 tablet by mouth as needed., Disp: , Rfl:  .  metFORMIN (GLUCOPHAGE) 500 MG tablet, Take 1 tablet (500 mg total) by mouth every evening., Disp: 90 tablet, Rfl: 1 .  telmisartan-hydrochlorothiazide (MICARDIS HCT) 80-25 MG per tablet, Take 1 tablet by mouth daily., Disp: 90 tablet, Rfl: 1  Allergies  Allergen Reactions  . Oxycodone Other (See Comments)  . Oxycodone Hcl     insomnia, agitation     ROS  Constitutional: Negative for fever, mild  weight change.  Respiratory: Negative for cough and shortness of breath.   Cardiovascular: Negative for chest pain or palpitations.  Gastrointestinal: Negative for abdominal pain, no bowel changes.  Musculoskeletal: Negative for gait problem or joint swelling.  Skin: Negative for rash.  Neurological: Negative for dizziness or headache.  No other specific complaints in a complete review of systems (except as listed in HPI above).  Objective  Filed Vitals:   01/17/15 0827  BP: 122/74  Pulse: 106  Temp: 98.1 F (36.7 C)  TempSrc: Oral  Resp: 16  Height: 5\' 1"  (1.549 m)  Weight: 225 lb 6.4 oz (102.241 kg)  SpO2: 97%    Body mass index is 42.61 kg/(m^2).  Physical Exam  Constitutional: Patient appears well-developed and well-nourished. No distress.  HENT: Head: Normocephalic and atraumatic. Ears: B TMs ok, no erythema or effusion; Nose: Nose normal. Mouth/Throat: Oropharynx is clear and moist. No oropharyngeal exudate.  Eyes: Conjunctivae and EOM are normal. Pupils are equal, round, and reactive to light. No scleral icterus.  Neck: Normal range of motion. Neck supple. No JVD present. No thyromegaly present.  Cardiovascular: Normal rate, regular rhythm and normal heart sounds.  No murmur heard. No BLE edema. Pulmonary/Chest: Effort normal and breath sounds normal. No respiratory distress. Abdominal: Soft. Bowel sounds are  normal, no distension. There is no tenderness. no masses Breast: no lumps or masses, no nipple discharge or rashes FEMALE GENITALIA:  External genitalia normal External urethra normal Vaginal vault normal without discharge or lesions Cervix normal without discharge or lesions Bimanual exam normal without masses RECTAL: not done Musculoskeletal: Normal range of motion, no joint effusions. No gross deformities Neurological: he is alert and oriented to person, place, and time. No cranial nerve deficit. Coordination, balance, strength, speech and gait are normal.  Skin: Skin is warm and dry. No rash noted. No erythema.  Psychiatric: Patient has a normal mood and affect. behavior is normal. Judgment and thought content normal.  Recent Results (from the past 2160 hour(s))  POCT Glucose (CBG)     Status: Abnormal   Collection Time: 11/17/14  9:04 AM  Result Value Ref Range   POC Glucose 103 (A) 70 - 99 mg/dl  POCT HgB A1C     Status: None   Collection Time: 11/17/14  9:04 AM  Result Value Ref Range   Hemoglobin A1C 6.1   POCT UA - Microalbumin  Status: None   Collection Time: 11/17/14  9:04 AM  Result Value Ref Range   Microalbumin Ur, POC 20 mg/L   Creatinine, POC  mg/dL   Albumin/Creatinine Ratio, Urine, POC    Glucose, capillary     Status: None   Collection Time: 11/30/14  6:40 AM  Result Value Ref Range   Glucose-Capillary 87 65 - 99 mg/dL  Glucose, capillary     Status: None   Collection Time: 11/30/14  8:36 AM  Result Value Ref Range   Glucose-Capillary 78 65 - 99 mg/dL  HM DIABETES EYE EXAM     Status: None   Collection Time: 01/12/15 12:00 AM  Result Value Ref Range   HM Diabetic Eye Exam No Retinopathy No Retinopathy    Comment: [facility]     PHQ2/9: Depression screen [facility] 2/9 01/17/2015 11/17/2014  Decreased Interest 0 0  Down, Depressed, Hopeless 0 0  PHQ - 2 Score 0 0     Fall Risk: Fall Risk  01/17/2015 11/17/2014  Falls in the past year? No No     Functional Status Survey: Is the patient deaf or have difficulty hearing?: No Does the patient have difficulty seeing, even when wearing glasses/contacts?: No Does the patient have difficulty concentrating, remembering, or making decisions?: No Does the patient have difficulty walking or climbing stairs?: No Does the patient have difficulty dressing or bathing?: No Does the patient have difficulty doing errands alone such as visiting a doctor's office or shopping?: No    Assessment & Plan  1. Welcome to Medicare preventive visit Discussed importance of 150 minutes of physical activity weekly, eat two servings of fish weekly, eat one serving of tree nuts ( cashews, pistachios, pecans, almonds.Marland Kitchen) every other day, eat 6 servings of fruit/vegetables daily and drink plenty of water and avoid sweet beverages.   2. Needs flu shot  - Flu vaccine HIGH DOSE PF (Fluzone High dose)  3. Need for pneumococcal vaccination  - Pneumococcal conjugate vaccine 13-valent  4. Cervical cancer screening  - Pap IG, CT/NG NAA, and HPV (high risk)  5. Osteoporosis screening - DG Bone Density; Future  6. Breast cancer screening  - MM Digital Screening; Future  7. Benign hypertension Possible old MI, but recently seen by cardiologist and normal myoview - EKG 12-Lead

## 2015-01-18 ENCOUNTER — Encounter: Payer: Self-pay | Admitting: Family Medicine

## 2015-01-23 LAB — PAP IG, CT-NG NAA, HPV HIGH-RISK: PAP SMEAR COMMENT: 0

## 2015-01-25 NOTE — Progress Notes (Signed)
Patient notified

## 2015-02-15 ENCOUNTER — Encounter: Payer: Self-pay | Admitting: Family Medicine

## 2015-02-15 ENCOUNTER — Ambulatory Visit (INDEPENDENT_AMBULATORY_CARE_PROVIDER_SITE_OTHER): Payer: PPO | Admitting: Family Medicine

## 2015-02-15 VITALS — BP 126/74 | HR 97 | Temp 98.1°F | Resp 14 | Ht 61.0 in | Wt 223.1 lb

## 2015-02-15 DIAGNOSIS — R300 Dysuria: Secondary | ICD-10-CM

## 2015-02-15 DIAGNOSIS — R319 Hematuria, unspecified: Secondary | ICD-10-CM

## 2015-02-15 LAB — POCT URINALYSIS DIPSTICK
BILIRUBIN UA: NEGATIVE
Glucose, UA: NEGATIVE
KETONES UA: NEGATIVE
NITRITE UA: NEGATIVE
PH UA: 6.5
Protein, UA: NEGATIVE
Spec Grav, UA: 1.02
Urobilinogen, UA: 0.2

## 2015-02-15 MED ORDER — CIPROFLOXACIN HCL 250 MG PO TABS: 250.0000 mg | ORAL_TABLET | Freq: Two times a day (BID) | ORAL | Status: DC

## 2015-02-15 NOTE — Progress Notes (Signed)
Name: Yvonne Curtis   MRN: [id number]    DOB: 12/03/1950   Date:02/15/2015       Progress Note  Subjective  Chief Complaint  Chief Complaint  Patient presents with  . Urinary Tract Infection    onset 1 day.  Paitent states having some hematuria and burning with urination.    HPI  Dysuria and Hematuria: patient states that symptoms started suddenly yesterday, initially dysuria, and she saw some blood when she wiped, but also saw some pink stain  on her underwear. Last intercourse about one week ago. No history of hysterectomy. ASCUS on  pap in 01/17/2015 . No fever, no back pain, no nausea or vomiting. No rashes noticed  Patient Active Problem List   Diagnosis Date Noted  . Acanthosis nigricans 11/14/2014  . Benign hypertension 11/14/2014  . Dyslipidemia 11/14/2014  . Gastro-esophageal reflux disease without esophagitis 11/14/2014  . Extreme obesity ([place]) 11/14/2014  . Primary osteoarthritis of both knees 11/14/2014  . Microalbuminuria 10/15/2012  . Diabetes mellitus with renal manifestations, controlled ([place]) 06/05/2009  . Body dermatophytosis 12/13/2008    Past Surgical History  Procedure Laterality Date  . Tubal ligation    . Septoplasty    . Tonsillectomy    . Gallbladder surgery    . Colonoscopy with propofol N/A 11/30/2014    Procedure: COLONOSCOPY WITH PROPOFOL;  Surgeon: Lucilla Lame, MD;  Location: [place];  Service: Endoscopy;  Laterality: N/A;  WITH BIOPSY-- TRANSVERSE COLON POLYP  X 2 DESCENING COLON POLYP    Family History  Problem Relation Age of Onset  . Colon cancer Mother   . Lung cancer Father   . Hypertension Sister   . Hypertension Sister     Social History   Social History  . Marital Status: Married    Spouse Name: N/A  . Number of Children: N/A  . Years of Education: N/A   Occupational History  . Not on file.   Social History Main Topics  . Smoking status: Never Smoker   . Smokeless tobacco: Never Used  . Alcohol Use:  No  . Drug Use: No  . Sexual Activity: Yes   Other Topics Concern  . Not on file   Social History Narrative     Current outpatient prescriptions:  .  acetaminophen (TYLENOL) 500 MG tablet, Take by mouth., Disp: , Rfl:  .  amLODipine (NORVASC) 10 MG tablet, Take 1 tablet (10 mg total) by mouth daily., Disp: 90 tablet, Rfl: 1 .  aspirin EC 81 MG tablet, Take by mouth., Disp: , Rfl:  .  atorvastatin (LIPITOR) 40 MG tablet, TAKE ONE TABLET BY MOUTH ONCE DAILY FOR  CHOLESTEROL, Disp: 90 tablet, Rfl: 1 .  Cholecalciferol (VITAMIN D3) 2000 UNITS capsule, VITAMIN D3, 2000UNIT (Oral Capsule)  2 po qday for 0 days  Quantity: 60.00;  Refills: 0   Ordered :15-May-2010  Steele Sizer MD;  Started 05-October-2008 Active, Disp: , Rfl:  .  fluticasone (FLONASE) 50 MCG/ACT nasal spray, Place into the nose., Disp: , Rfl:  .  glucose blood (NOVA MAX TEST) test strip, NOVA MAX GLUCOSE TEST (In Vitro Strip)  check fsbs qam for 0 days  Quantity: 100.00;  Refills: 0   Ordered :15-May-2010  Steele Sizer MD;  Adele Barthel Active Comments: DX: 250.00, Disp: , Rfl:  .  loratadine (ALLERGY) 10 MG dissolvable tablet, Take 1 tablet by mouth as needed., Disp: , Rfl:  .  metFORMIN (GLUCOPHAGE) 500 MG tablet, Take 1 tablet (500 mg  total) by mouth every evening., Disp: 90 tablet, Rfl: 1 .  telmisartan-hydrochlorothiazide (MICARDIS HCT) 80-25 MG per tablet, Take 1 tablet by mouth daily., Disp: 90 tablet, Rfl: 1  Allergies  Allergen Reactions  . Oxycodone Other (See Comments)  . Oxycodone Hcl     insomnia, agitation     ROS  Constitutional: Negative for fever or weight change.  Respiratory: Negative for cough and shortness of breath.   Cardiovascular: Negative for chest pain or palpitations.  Gastrointestinal: Negative for abdominal pain, no bowel changes.  Musculoskeletal: Negative for gait problem or joint swelling.  Skin: Negative for rash.  Neurological: Negative for dizziness or headache.  No other  specific complaints in a complete review of systems (except as listed in HPI above).  Objective  Filed Vitals:   02/15/15 0816  BP: 126/74  Pulse: 97  Temp: 98.1 F (36.7 C)  TempSrc: Oral  Resp: 14  Height: 5\' 1"  (1.549 m)  Weight: 223 lb 1.6 oz (101.197 kg)  SpO2: 94%    Body mass index is 42.18 kg/(m^2).  Physical Exam  Constitutional: Patient appears well-developed and well-nourished. Obese No distress.  HEENT: head atraumatic, normocephalic, pupils equal and reactive to light, eneck supple, throat within normal limits Cardiovascular: Normal rate, regular rhythm and normal heart sounds.  No murmur heard. No BLE edema. Pulmonary/Chest: Effort normal and breath sounds normal. No respiratory distress. Abdominal: Soft.  There is no tenderness. Negative CVA tenderness Psychiatric: Patient has a normal mood and affect. behavior is normal. Judgment and thought content normal. Pelvic: scant amount of discharge in vagina, no blood in cervical os or vaginal wall, normal external genitalia / no lesions. No tenderness with bimanual exam  Recent Results (from the past 2160 hour(s))  POCT Glucose (CBG)     Status: Abnormal   Collection Time: 11/17/14  9:04 AM  Result Value Ref Range   POC Glucose 103 (A) 70 - 99 mg/dl  POCT HgB A1C     Status: None   Collection Time: 11/17/14  9:04 AM  Result Value Ref Range   Hemoglobin A1C 6.1   POCT UA - Microalbumin     Status: None   Collection Time: 11/17/14  9:04 AM  Result Value Ref Range   Microalbumin Ur, POC 20 mg/L   Creatinine, POC  mg/dL   Albumin/Creatinine Ratio, Urine, POC    Glucose, capillary     Status: None   Collection Time: 11/30/14  6:40 AM  Result Value Ref Range   Glucose-Capillary 87 65 - 99 mg/dL  Glucose, capillary     Status: None   Collection Time: 11/30/14  8:36 AM  Result Value Ref Range   Glucose-Capillary 78 65 - 99 mg/dL  HM DIABETES EYE EXAM     Status: None   Collection Time: 01/12/15 12:00 AM  Result  Value Ref Range   HM Diabetic Eye Exam No Retinopathy No Retinopathy    Comment: [facility]  Pap IG, CT/NG NAA, and HPV (high risk)     Status: Abnormal   Collection Time: 01/17/15 12:00 AM  Result Value Ref Range   DIAGNOSIS: Comment (A)     Comment: EPITHELIAL CELL ABNORMALITY. ATYPICAL SQUAMOUS CELLS OF UNDETERMINED SIGNIFICANCE.    Recommendation: Comment (A)     Comment: Suggest follow up as clinically appropriate.   Specimen adequacy: Comment     Comment: Satisfactory for evaluation. Endocervical and/or squamous metaplastic cells (endocervical component) are present.    CLINICIAN PROVIDED ICD10: Comment  Comment: Z12.4   Performed by: Comment     Comment: Rivka Barbara, Cytotechnologist (ASCP)   Electronically signed by: Comment     Comment: Gretta Arab, MD, Pathologist   PAP SMEAR COMMENT .    PATHOLOGIST PROVIDED ICD10: Comment     Comment: R87.610   Note: Comment     Comment: The Pap smear is a screening test designed to aid in the detection of premalignant and malignant conditions of the uterine cervix.  It is not a diagnostic procedure and should not be used as the sole means of detecting cervical cancer.  Both false-positive and false-negative reports do occur.    Test Methodology Comment     Comment: This liquid based ThinPrep(R) pap test was screened with the use of an image guided system.      PHQ2/9: Depression screen [facility] 2/9 01/17/2015 11/17/2014  Decreased Interest 0 0  Down, Depressed, Hopeless 0 0  PHQ - 2 Score 0 0     Fall Risk: Fall Risk  01/17/2015 11/17/2014  Falls in the past year? No No     Assessment & Plan  1. Burning with urination  Likely from UTI, but if culture negative refer to Urologist - POCT urinalysis dipstick - Urine culture - ciprofloxacin (CIPRO) 250 MG tablet; Take 1 tablet (250 mg total) by mouth 2 (two) times daily.  Dispense: 6 tablet; Refill: 0  2. Hematuria   We will send urine for culture and refer to  Urologist if negative - ciprofloxacin (CIPRO) 250 MG tablet; Take 1 tablet (250 mg total) by mouth 2 (two) times daily.  Dispense: 6 tablet; Refill: 0

## 2015-02-16 LAB — URINE CULTURE

## 2015-02-16 LAB — PLEASE NOTE

## 2015-02-17 ENCOUNTER — Other Ambulatory Visit: Payer: Self-pay | Admitting: Family Medicine

## 2015-02-17 DIAGNOSIS — R319 Hematuria, unspecified: Secondary | ICD-10-CM

## 2015-05-21 ENCOUNTER — Ambulatory Visit: Payer: PPO | Admitting: Family Medicine

## 2015-05-23 ENCOUNTER — Ambulatory Visit (INDEPENDENT_AMBULATORY_CARE_PROVIDER_SITE_OTHER): Payer: Medicare Other | Admitting: Family Medicine

## 2015-05-23 ENCOUNTER — Encounter: Payer: Self-pay | Admitting: Family Medicine

## 2015-05-23 VITALS — BP 116/76 | HR 110 | Temp 97.9°F | Resp 16 | Ht 61.0 in | Wt 225.0 lb

## 2015-05-23 DIAGNOSIS — I10 Essential (primary) hypertension: Secondary | ICD-10-CM | POA: Diagnosis not present

## 2015-05-23 DIAGNOSIS — Z79899 Other long term (current) drug therapy: Secondary | ICD-10-CM

## 2015-05-23 DIAGNOSIS — E785 Hyperlipidemia, unspecified: Secondary | ICD-10-CM

## 2015-05-23 DIAGNOSIS — K219 Gastro-esophageal reflux disease without esophagitis: Secondary | ICD-10-CM | POA: Diagnosis not present

## 2015-05-23 DIAGNOSIS — R319 Hematuria, unspecified: Secondary | ICD-10-CM

## 2015-05-23 DIAGNOSIS — M170 Bilateral primary osteoarthritis of knee: Secondary | ICD-10-CM

## 2015-05-23 DIAGNOSIS — E1121 Type 2 diabetes mellitus with diabetic nephropathy: Secondary | ICD-10-CM | POA: Diagnosis not present

## 2015-05-23 LAB — POCT UA - MICROALBUMIN: MICROALBUMIN (UR) POC: NEGATIVE mg/L

## 2015-05-23 LAB — POCT GLYCOSYLATED HEMOGLOBIN (HGB A1C): HEMOGLOBIN A1C: 6.4

## 2015-05-23 MED ORDER — AMLODIPINE BESYLATE 10 MG PO TABS: 10.0000 mg | ORAL_TABLET | Freq: Every day | ORAL | Status: DC

## 2015-05-23 MED ORDER — TELMISARTAN-HCTZ 80-25 MG PO TABS: 1.0000 | ORAL_TABLET | Freq: Every day | ORAL | Status: DC

## 2015-05-23 MED ORDER — ATORVASTATIN CALCIUM 40 MG PO TABS: ORAL_TABLET | ORAL | Status: DC

## 2015-05-23 MED ORDER — METFORMIN HCL 500 MG PO TABS: 500.0000 mg | ORAL_TABLET | Freq: Every evening | ORAL | Status: DC

## 2015-05-23 NOTE — Addendum Note (Signed)
Addended by: Steele Sizer F on: 05/23/2015 10:49 AM   Modules accepted: Orders, SmartSet

## 2015-05-23 NOTE — Progress Notes (Addendum)
Name: Yvonne Curtis   MRN: [id number]    DOB: 1950-10-19   Date:05/23/2015       Progress Note  Subjective  Chief Complaint  Chief Complaint  Patient presents with  . Medication Refill    6 month F/U, Patient Insurance changed and will now need 90 day supplies sent to [facility]  . Hypertension  . Diabetes    Checks 3x weekly but does not remember her averages  . Allergic Rhinitis     Well controlled    HPI  DMII with renal manifestation: she has been taking Metformin daily and denies side effects, glucose at home is around 100's in the mornings. She denies polyphagia, polyuria or polydipsia. Eye exam is up to date, last urine micro back to normal. She is on ARB - denies side effects  HTN: she has been taking her medications as prescribed and denies side effects. BP today towards low end of normal with slight tachycardia, no dizziness or syncope. No chest pain or palpitation, she wants to continue on current medications  Hyperlipidemia: Taking Lipitor and denies side effects. No myalgia  Obesity:she has gained a couple of lbs since last visit, she could not afford Belviq. No longer drinking sodas, discussed importance of physical activity  GERD: under control at this time, not on medication, denies heartburn or regurgitation  OA knee: mild aching pain intermittent on left side. Less painful, doing better. Pain is on anterior left knee. Worse when going up or down stairs.   Hematuria: she did not keep follow up with Urologist, negative urine culture. No dysuria or any other symptoms, states resolved once she stopped soda intake. Discussed it may have been post-menopausal bleeding and needs further evaluation, but she refuses.    Patient Active Problem List   Diagnosis Date Noted  . Hematuria 02/17/2015  . Acanthosis nigricans 11/14/2014  . Benign hypertension 11/14/2014  . Dyslipidemia 11/14/2014  . Gastro-esophageal reflux disease without esophagitis 11/14/2014  .  Extreme obesity ([place]) 11/14/2014  . Primary osteoarthritis of both knees 11/14/2014  . Microalbuminuria 10/15/2012  . Diabetes mellitus with renal manifestations, controlled ([place]) 06/05/2009  . Body dermatophytosis 12/13/2008    Past Surgical History  Procedure Laterality Date  . Tubal ligation    . Septoplasty    . Tonsillectomy    . Gallbladder surgery    . Colonoscopy with propofol N/A 11/30/2014    Procedure: COLONOSCOPY WITH PROPOFOL;  Surgeon: Lucilla Lame, MD;  Location: [place];  Service: Endoscopy;  Laterality: N/A;  WITH BIOPSY-- TRANSVERSE COLON POLYP  X 2 DESCENING COLON POLYP    Family History  Problem Relation Age of Onset  . Colon cancer Mother   . Lung cancer Father   . Hypertension Sister   . Hypertension Sister     Social History   Social History  . Marital Status: Married    Spouse Name: N/A  . Number of Children: N/A  . Years of Education: N/A   Occupational History  . Not on file.   Social History Main Topics  . Smoking status: Never Smoker   . Smokeless tobacco: Never Used  . Alcohol Use: No  . Drug Use: No  . Sexual Activity: Yes   Other Topics Concern  . Not on file   Social History Narrative     Current outpatient prescriptions:  .  acetaminophen (TYLENOL) 500 MG tablet, Take by mouth., Disp: , Rfl:  .  amLODipine (NORVASC) 10 MG tablet, Take 1  tablet (10 mg total) by mouth daily., Disp: 90 tablet, Rfl: 1 .  aspirin EC 81 MG tablet, Take by mouth., Disp: , Rfl:  .  atorvastatin (LIPITOR) 40 MG tablet, TAKE ONE TABLET BY MOUTH ONCE DAILY FOR  CHOLESTEROL, Disp: 90 tablet, Rfl: 1 .  Cholecalciferol (VITAMIN D3) 2000 UNITS capsule, VITAMIN D3, 2000UNIT (Oral Capsule)  2 po qday for 0 days  Quantity: 60.00;  Refills: 0   Ordered :15-May-2010  Steele Sizer MD;  Started 05-October-2008 Active, Disp: , Rfl:  .  fluticasone (FLONASE) 50 MCG/ACT nasal spray, Place into the nose., Disp: , Rfl:  .  glucose blood (NOVA MAX TEST) test  strip, NOVA MAX GLUCOSE TEST (In Vitro Strip)  check fsbs qam for 0 days  Quantity: 100.00;  Refills: 0   Ordered :15-May-2010  Steele Sizer MD;  Adele Barthel Active Comments: DX: 250.00, Disp: , Rfl:  .  glucose blood test strip, , Disp: , Rfl:  .  metFORMIN (GLUCOPHAGE) 500 MG tablet, Take 1 tablet (500 mg total) by mouth every evening., Disp: 90 tablet, Rfl: 1 .  telmisartan-hydrochlorothiazide (MICARDIS HCT) 80-25 MG tablet, Take 1 tablet by mouth daily., Disp: 90 tablet, Rfl: 1  Allergies  Allergen Reactions  . Oxycodone Other (See Comments)  . Oxycodone Hcl     insomnia, agitation     ROS  Constitutional: Negative for fever or weight change.  Respiratory: Negative for cough and shortness of breath.   Cardiovascular: Negative for chest pain or palpitations.  Gastrointestinal: Negative for abdominal pain, no bowel changes.  Musculoskeletal: Negative for gait problem or joint swelling.  Skin: Negative for rash.  Neurological: Negative for dizziness or headache.  No other specific complaints in a complete review of systems (except as listed in HPI above).  Objective  Filed Vitals:   05/23/15 1012  BP: 116/76  Pulse: 110  Temp: 97.9 F (36.6 C)  TempSrc: Oral  Resp: 16  Height: 5\' 1"  (1.549 m)  Weight: 225 lb (102.059 kg)  SpO2: 95%    Body mass index is 42.54 kg/(m^2).  Physical Exam  Constitutional: Patient appears well-developed and well-nourished. Obese  No distress.  HEENT: head atraumatic, normocephalic, pupils equal and reactive to light, neck supple, throat within normal limits Cardiovascular: Normal rate, regular rhythm and normal heart sounds.  No murmur heard. No BLE edema. Pulmonary/Chest: Effort normal and breath sounds normal. No respiratory distress. Abdominal: Soft.  There is no tenderness. Psychiatric: Patient has a normal mood and affect. behavior is normal. Judgment and thought content normal.  Recent Results (from the past 2160 hour(s))   POCT HgB A1C     Status: None   Collection Time: 05/23/15 10:21 AM  Result Value Ref Range   Hemoglobin A1C 6.4   POCT UA - Microalbumin     Status: None   Collection Time: 05/23/15 10:22 AM  Result Value Ref Range   Microalbumin Ur, POC negative mg/L   Creatinine, POC  mg/dL   Albumin/Creatinine Ratio, Urine, POC       PHQ2/9: Depression screen [facility] 2/9 05/23/2015 01/17/2015 11/17/2014  Decreased Interest 0 0 0  Down, Depressed, Hopeless 0 0 0  PHQ - 2 Score 0 0 0     Fall Risk: Fall Risk  05/23/2015 01/17/2015 11/17/2014  Falls in the past year? No No No      Functional Status Survey: Is the patient deaf or have difficulty hearing?: No Does the patient have difficulty seeing, even when wearing glasses/contacts?: No  Does the patient have difficulty concentrating, remembering, or making decisions?: No Does the patient have difficulty walking or climbing stairs?: No Does the patient have difficulty dressing or bathing?: No Does the patient have difficulty doing errands alone such as visiting a doctor's office or shopping?: No    Assessment & Plan  1. 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  - POCT HgB A1C - POCT UA - Microalbumin - telmisartan-hydrochlorothiazide (MICARDIS HCT) 80-25 MG tablet; Take 1 tablet by mouth daily.  Dispense: 90 tablet; Refill: 1 - metFORMIN (GLUCOPHAGE) 500 MG tablet; Take 1 tablet (500 mg total) by mouth every evening.  Dispense: 90 tablet; Refill: 1  2. Benign hypertension  - amLODipine (NORVASC) 10 MG tablet; Take 1 tablet (10 mg total) by mouth daily.  Dispense: 90 tablet; Refill: 1 -GFR  3. Dyslipidemia  - atorvastatin (LIPITOR) 40 MG tablet; TAKE ONE TABLET BY MOUTH ONCE DAILY FOR  CHOLESTEROL  Dispense: 90 tablet; Refill: 1 -Lipid panel -AST, ALT  4. Morbid obesity, unspecified obesity type ([place])  Discussed life style modification   5.  Primary osteoarthritis of both knees  Doing better, continue Tylenol prn   6. Gastro-esophageal reflux disease without esophagitis  Off medication, doing well   7. Hematuria  Did not follow up with Urologist, she will call back if recurrence.

## 2015-05-24 LAB — LIPID PANEL
Chol/HDL Ratio: 2.8 ratio units (ref 0.0–4.4)
Cholesterol, Total: 124 mg/dL (ref 100–199)
HDL: 45 mg/dL (ref >39)
LDL CALC: 56 mg/dL (ref 0–99)
Triglycerides: 113 mg/dL (ref 0–149)
VLDL CHOLESTEROL CAL: 23 mg/dL (ref 5–40)

## 2015-05-24 LAB — GLOM FILT RATE, ESTIMATED
Creatinine, Ser: 1.05 mg/dL — ABNORMAL HIGH (ref 0.57–1.00)
GFR calc Af Amer: 64 mL/min/{1.73_m2} (ref >59)
GFR calc non Af Amer: 56 mL/min/{1.73_m2} — ABNORMAL LOW (ref >59)

## 2015-05-24 LAB — AST: AST: 22 IU/L (ref 0–40)

## 2015-05-24 LAB — ALT: ALT: 31 IU/L (ref 0–32)

## 2015-05-30 DIAGNOSIS — J029 Acute pharyngitis, unspecified: Secondary | ICD-10-CM | POA: Diagnosis not present

## 2015-07-22 ENCOUNTER — Other Ambulatory Visit: Payer: Self-pay | Admitting: Family Medicine

## 2015-08-10 ENCOUNTER — Other Ambulatory Visit: Payer: Self-pay

## 2015-08-10 ENCOUNTER — Telehealth: Payer: Self-pay

## 2015-08-10 DIAGNOSIS — E785 Hyperlipidemia, unspecified: Secondary | ICD-10-CM

## 2015-08-10 DIAGNOSIS — E1121 Type 2 diabetes mellitus with diabetic nephropathy: Secondary | ICD-10-CM

## 2015-08-10 DIAGNOSIS — I10 Essential (primary) hypertension: Secondary | ICD-10-CM

## 2015-08-10 MED ORDER — TELMISARTAN-HCTZ 80-25 MG PO TABS: 1.0000 | ORAL_TABLET | Freq: Every day | ORAL | Status: DC

## 2015-08-10 MED ORDER — ATORVASTATIN CALCIUM 40 MG PO TABS: ORAL_TABLET | ORAL | Status: DC

## 2015-08-10 MED ORDER — AMLODIPINE BESYLATE 10 MG PO TABS: 10.0000 mg | ORAL_TABLET | Freq: Every day | ORAL | Status: DC

## 2015-08-10 MED ORDER — METFORMIN HCL 500 MG PO TABS: 500.0000 mg | ORAL_TABLET | Freq: Every evening | ORAL | Status: DC

## 2015-08-10 NOTE — Telephone Encounter (Signed)
Refill request was sent to Dr. Krichna Sowles for approval and submission.  

## 2015-08-13 NOTE — Telephone Encounter (Signed)
Erroneous entry

## 2015-09-03 ENCOUNTER — Other Ambulatory Visit: Payer: Self-pay

## 2015-09-03 DIAGNOSIS — E1121 Type 2 diabetes mellitus with diabetic nephropathy: Secondary | ICD-10-CM

## 2015-09-03 MED ORDER — TELMISARTAN-HCTZ 80-25 MG PO TABS: 1.0000 | ORAL_TABLET | Freq: Every day | ORAL | Status: DC

## 2015-09-03 NOTE — Telephone Encounter (Signed)
Refill request was sent to Dr. Krichna Sowles for approval and submission.  

## 2015-09-20 ENCOUNTER — Ambulatory Visit (INDEPENDENT_AMBULATORY_CARE_PROVIDER_SITE_OTHER): Payer: Medicare Other | Admitting: Family Medicine

## 2015-09-20 ENCOUNTER — Encounter: Payer: Self-pay | Admitting: Family Medicine

## 2015-09-20 VITALS — BP 122/70 | HR 96 | Temp 97.9°F | Resp 18 | Wt 227.6 lb

## 2015-09-20 DIAGNOSIS — K219 Gastro-esophageal reflux disease without esophagitis: Secondary | ICD-10-CM

## 2015-09-20 DIAGNOSIS — I10 Essential (primary) hypertension: Secondary | ICD-10-CM | POA: Diagnosis not present

## 2015-09-20 DIAGNOSIS — E1122 Type 2 diabetes mellitus with diabetic chronic kidney disease: Secondary | ICD-10-CM

## 2015-09-20 DIAGNOSIS — E785 Hyperlipidemia, unspecified: Secondary | ICD-10-CM

## 2015-09-20 DIAGNOSIS — N182 Chronic kidney disease, stage 2 (mild): Secondary | ICD-10-CM | POA: Diagnosis not present

## 2015-09-20 LAB — POCT GLYCOSYLATED HEMOGLOBIN (HGB A1C): Hemoglobin A1C: 6.4

## 2015-09-20 NOTE — Progress Notes (Signed)
Name: Yvonne Curtis   MRN: [id number]    DOB: 08/18/1950   Date:09/20/2015       Progress Note  Subjective  Chief Complaint  Chief Complaint  Patient presents with  . Diabetes    patient is here for her 49-month f/u. she checks her blood sugar 1/wk, but her husband keeps up with the numbers.   . Hypertension    patient reports no neg sx  . dyslipidemia  . Obesity    patient has gained 2 lbs  . Gastroesophageal Reflux    HPI  DMII with renal manifestation: she has been taking Metformin daily and denies side effects, glucose has been checked every day but she has not been writing it down -states always within normal limits  She denies polyphagia, polyuria or polydipsia. Eye exam is up to date, last urine micro was normal in January 2017. She is on ARB - denies side effects  HTN: she has been taking her medications as prescribed and denies side effects. Denies orthostatic changes, chest pain or palpitation,   Hyperlipidemia: Taking Lipitor and denies side effects. No myalgia  Obesity:she has gained a couple of lbs since last visit, she could not afford Belviq. No longer drinking sodas, she has joined [employer] two weeks ago but has not gone in yet.   GERD: under control at this time, not on medication, denies heartburn or regurgitation.  OA knee: mild aching pain intermittent on left side. Less painful since retired. Pain is on anterior left knee. Worse when going up or down stairs.   Hematuria: she did not keep follow up with Urologist, negative urine culture. No dysuria or any other symptoms, states resolved once she stopped soda intake. Discussed it may have been post-menopausal bleeding and needs further evaluation, but she refuses.    Patient Active Problem List   Diagnosis Date Noted  . Hematuria 02/17/2015  . Acanthosis nigricans 11/14/2014  . Benign hypertension 11/14/2014  . Dyslipidemia 11/14/2014  . Gastro-esophageal reflux disease without esophagitis 11/14/2014   . Extreme obesity ([place]) 11/14/2014  . Primary osteoarthritis of both knees 11/14/2014  . Microalbuminuria 10/15/2012  . Diabetes mellitus with renal manifestations, controlled ([place]) 06/05/2009  . Body dermatophytosis 12/13/2008    Past Surgical History  Procedure Laterality Date  . Tubal ligation    . Septoplasty    . Tonsillectomy    . Gallbladder surgery    . Colonoscopy with propofol N/A 11/30/2014    Procedure: COLONOSCOPY WITH PROPOFOL;  Surgeon: Lucilla Lame, MD;  Location: [place];  Service: Endoscopy;  Laterality: N/A;  WITH BIOPSY-- TRANSVERSE COLON POLYP  X 2 DESCENING COLON POLYP    Family History  Problem Relation Age of Onset  . Colon cancer Mother   . Lung cancer Father   . Hypertension Sister   . Hypertension Sister     Social History   Social History  . Marital Status: Married    Spouse Name: N/A  . Number of Children: N/A  . Years of Education: N/A   Occupational History  . Not on file.   Social History Main Topics  . Smoking status: Never Smoker   . Smokeless tobacco: Never Used  . Alcohol Use: No  . Drug Use: No  . Sexual Activity: Yes   Other Topics Concern  . Not on file   Social History Narrative     Current outpatient prescriptions:  .  acetaminophen (TYLENOL) 500 MG tablet, Take by mouth., Disp: , Rfl:  .  amLODipine (NORVASC) 10 MG tablet, Take 1 tablet (10 mg total) by mouth daily., Disp: 90 tablet, Rfl: 1 .  aspirin EC 81 MG tablet, Take by mouth., Disp: , Rfl:  .  atorvastatin (LIPITOR) 40 MG tablet, TAKE ONE TABLET BY MOUTH ONCE DAILY FOR  CHOLESTEROL, Disp: 90 tablet, Rfl: 1 .  Cholecalciferol (VITAMIN D3) 2000 UNITS capsule, VITAMIN D3, 2000UNIT (Oral Capsule)  2 po qday for 0 days  Quantity: 60.00;  Refills: 0   Ordered :15-May-2010  Steele Sizer MD;  Started 05-October-2008 Active, Disp: , Rfl:  .  fluticasone (FLONASE) 50 MCG/ACT nasal spray, Place into the nose., Disp: , Rfl:  .  glucose blood (NOVA MAX TEST)  test strip, NOVA MAX GLUCOSE TEST (In Vitro Strip)  check fsbs qam for 0 days  Quantity: 100.00;  Refills: 0   Ordered :15-May-2010  Steele Sizer MD;  Adele Barthel Active Comments: DX: 250.00, Disp: , Rfl:  .  glucose blood test strip, , Disp: , Rfl:  .  metFORMIN (GLUCOPHAGE) 500 MG tablet, Take 1 tablet (500 mg total) by mouth every evening., Disp: 90 tablet, Rfl: 1 .  telmisartan-hydrochlorothiazide (MICARDIS HCT) 80-25 MG tablet, Take 1 tablet by mouth daily., Disp: 90 tablet, Rfl: 1  Allergies  Allergen Reactions  . Oxycodone Other (See Comments)  . Oxycodone Hcl     insomnia, agitation     ROS  Constitutional: Negative for fever , positive for  weight change.  Respiratory: Negative for cough and shortness of breath.   Cardiovascular: Negative for chest pain or palpitations.  Gastrointestinal: Negative for abdominal pain, no bowel changes.  Musculoskeletal: Negative for gait problem or joint swelling.  Skin: Negative for rash.  Neurological: Negative for dizziness or headache.  No other specific complaints in a complete review of systems (except as listed in HPI above).   Objective  Filed Vitals:   09/20/15 0754  BP: 122/70  Pulse: 96  Temp: 97.9 F (36.6 C)  TempSrc: Oral  Resp: 18  Weight: 227 lb 9.6 oz (103.239 kg)  SpO2: 96%    Body mass index is 43.03 kg/(m^2).  Physical Exam  Constitutional: Patient appears well-developed and well-nourished. Obese  No distress.  HEENT: head atraumatic, normocephalic, pupils equal and reactive to light, neck supple, throat within normal limits Cardiovascular: Normal rate, regular rhythm and normal heart sounds.  No murmur heard. No BLE edema. Pulmonary/Chest: Effort normal and breath sounds normal. No respiratory distress. Abdominal: Soft.  There is no tenderness. Psychiatric: Patient has a normal mood and affect. behavior is normal. Judgment and thought content normal.  Recent Results (from the past 2160 hour(s))   POCT glycosylated hemoglobin (Hb A1C)     Status: Abnormal   Collection Time: 09/20/15  8:00 AM  Result Value Ref Range   Hemoglobin A1C 6.4     Diabetic Foot Exam: Diabetic Foot Exam - Simple   Simple Foot Form  Diabetic Foot exam was performed with the following findings:  Yes 09/20/2015  8:08 AM  Visual Inspection  No deformities, no ulcerations, no other skin breakdown bilaterally:  Yes  Sensation Testing  Intact to touch and monofilament testing bilaterally:  Yes  Pulse Check  Posterior Tibialis and Dorsalis pulse intact bilaterally:  Yes  Comments       PHQ2/9: Depression screen [facility] 2/9 09/20/2015 05/23/2015 01/17/2015 11/17/2014  Decreased Interest 0 0 0 0  Down, Depressed, Hopeless 0 0 0 0  PHQ - 2 Score 0 0 0 0  Fall Risk: Fall Risk  09/20/2015 05/23/2015 01/17/2015 11/17/2014  Falls in the past year? No No No No    Functional Status Survey: Is the patient deaf or have difficulty hearing?: No Does the patient have difficulty seeing, even when wearing glasses/contacts?: No Does the patient have difficulty concentrating, remembering, or making decisions?: No Does the patient have difficulty walking or climbing stairs?: Yes (patient has some trouble with her knees) Does the patient have difficulty dressing or bathing?: No Does the patient have difficulty doing errands alone such as visiting a doctor's office or shopping?: No    Assessment & Plan  1. Controlled type 2 diabetes mellitus with stage 2 chronic kidney disease, without long-term current use of insulin (HCC)  - POCT glycosylated hemoglobin (Hb A1C) 6.4 % is at goal   2. Benign hypertension  At goal, continue medication   3. Dyslipidemia  Continue Lipitor  4. Morbid obesity, unspecified obesity type [facility])  Discussed with the patient the risk posed by an increased BMI. Discussed importance of portion control, calorie counting and at least 150 minutes of physical activity weekly. Avoid sweet beverages  and drink more water. Eat at least 6 servings of fruit and vegetables daily   5. Gastro-esophageal reflux disease without esophagitis  Well controlled at this time

## 2015-12-05 DIAGNOSIS — E119 Type 2 diabetes mellitus without complications: Secondary | ICD-10-CM | POA: Diagnosis not present

## 2015-12-05 DIAGNOSIS — R0602 Shortness of breath: Secondary | ICD-10-CM | POA: Diagnosis not present

## 2015-12-05 DIAGNOSIS — E784 Other hyperlipidemia: Secondary | ICD-10-CM | POA: Diagnosis not present

## 2015-12-05 DIAGNOSIS — I10 Essential (primary) hypertension: Secondary | ICD-10-CM | POA: Diagnosis not present

## 2016-01-22 ENCOUNTER — Ambulatory Visit (INDEPENDENT_AMBULATORY_CARE_PROVIDER_SITE_OTHER): Payer: Medicare Other | Admitting: Family Medicine

## 2016-01-22 ENCOUNTER — Encounter: Payer: Self-pay | Admitting: Family Medicine

## 2016-01-22 VITALS — BP 124/74 | HR 92 | Temp 98.2°F | Resp 18 | Ht 61.0 in | Wt 224.4 lb

## 2016-01-22 DIAGNOSIS — E1121 Type 2 diabetes mellitus with diabetic nephropathy: Secondary | ICD-10-CM | POA: Diagnosis not present

## 2016-01-22 DIAGNOSIS — I10 Essential (primary) hypertension: Secondary | ICD-10-CM | POA: Diagnosis not present

## 2016-01-22 DIAGNOSIS — K219 Gastro-esophageal reflux disease without esophagitis: Secondary | ICD-10-CM

## 2016-01-22 DIAGNOSIS — E1122 Type 2 diabetes mellitus with diabetic chronic kidney disease: Secondary | ICD-10-CM | POA: Diagnosis not present

## 2016-01-22 DIAGNOSIS — J302 Other seasonal allergic rhinitis: Secondary | ICD-10-CM

## 2016-01-22 DIAGNOSIS — E785 Hyperlipidemia, unspecified: Secondary | ICD-10-CM

## 2016-01-22 DIAGNOSIS — M170 Bilateral primary osteoarthritis of knee: Secondary | ICD-10-CM

## 2016-01-22 DIAGNOSIS — N182 Chronic kidney disease, stage 2 (mild): Secondary | ICD-10-CM | POA: Diagnosis not present

## 2016-01-22 LAB — POCT GLYCOSYLATED HEMOGLOBIN (HGB A1C): HEMOGLOBIN A1C: 6.4

## 2016-01-22 MED ORDER — AMLODIPINE BESYLATE 10 MG PO TABS: 10.0000 mg | ORAL_TABLET | Freq: Every day | ORAL | 1 refills | Status: DC

## 2016-01-22 MED ORDER — TELMISARTAN-HCTZ 80-25 MG PO TABS: 1.0000 | ORAL_TABLET | Freq: Every day | ORAL | 1 refills | Status: DC

## 2016-01-22 MED ORDER — ATORVASTATIN CALCIUM 40 MG PO TABS: ORAL_TABLET | ORAL | 1 refills | Status: DC

## 2016-01-22 MED ORDER — LORATADINE 10 MG PO TABS: 10.0000 mg | ORAL_TABLET | Freq: Every day | ORAL | 0 refills | Status: DC

## 2016-01-22 NOTE — Progress Notes (Signed)
Name: Yvonne Curtis   MRN: [id number]    DOB: [birth year]   Date:01/22/2016       Progress Note  Subjective  Chief Complaint  Chief Complaint  Patient presents with  . Follow-up    HPI  DMII with renal manifestation: she has been taking Metformin daily and denies side effects, fsbs has been checked at home, but not sure of the values - husband checks it for her. She denies polyphagia, polyuria or polydipsia. She is due for an eye exam, last urine micro was normal in January 2017. She is on ARB - denies side effects.  HTN: she has been taking her medications as prescribed and denies side effects. Denies orthostatic changes, chest pain or palpitation  Hyperlipidemia: Taking Lipitor and denies side effects. No myalgia  Obesity: She is not taking Belviq because of the cost, but has lost a few lbs since last visit. . No longer drinking sodas, she has been walking.   GERD: under control at this time, not on medication, denies heartburn or regurgitation.  OA knee: she states her knees no longer bothering her, but has noticed some pain on right groin when she walks for a long period of time. She was doing well after retirement, but is currently working part-time cleaning [employer] - she states she will quit because walking on cement all day is not good for her  AR: she is doing well on Flonase and prn Loratadine, denies sneezing or rhinorrhea, she has occasional congestion   Patient Active Problem List   Diagnosis Date Noted  . Hematuria 02/17/2015  . Acanthosis nigricans 11/14/2014  . Benign hypertension 11/14/2014  . Dyslipidemia 11/14/2014  . Gastro-esophageal reflux disease without esophagitis 11/14/2014  . Extreme obesity ([place]) 11/14/2014  . Primary osteoarthritis of both knees 11/14/2014  . Microalbuminuria 10/15/2012  . Diabetes mellitus with renal manifestations, controlled ([place]) 06/05/2009  . Body dermatophytosis 12/13/2008    Past Surgical History:  Procedure  Laterality Date  . COLONOSCOPY WITH PROPOFOL N/A 11/30/2014   Procedure: COLONOSCOPY WITH PROPOFOL;  Surgeon: Lucilla Lame, MD;  Location: [place];  Service: Endoscopy;  Laterality: N/A;  WITH BIOPSY-- TRANSVERSE COLON POLYP  X 2 DESCENING COLON POLYP  . GALLBLADDER SURGERY    . SEPTOPLASTY    . tonsillectomy    . TUBAL LIGATION      Family History  Problem Relation Age of Onset  . Colon cancer Mother   . Lung cancer Father   . Hypertension Sister   . Hypertension Sister     Social History   Social History  . Marital status: Married    Spouse name: N/A  . Number of children: N/A  . Years of education: N/A   Occupational History  . Not on file.   Social History Main Topics  . Smoking status: Never Smoker  . Smokeless tobacco: Never Used  . Alcohol use No  . Drug use: No  . Sexual activity: Yes   Other Topics Concern  . Not on file   Social History Narrative  . No narrative on file     Current Outpatient Prescriptions:  .  acetaminophen (TYLENOL) 500 MG tablet, Take by mouth., Disp: , Rfl:  .  amLODipine (NORVASC) 10 MG tablet, Take 1 tablet (10 mg total) by mouth daily., Disp: 90 tablet, Rfl: 1 .  aspirin EC 81 MG tablet, Take by mouth., Disp: , Rfl:  .  atorvastatin (LIPITOR) 40 MG tablet, TAKE ONE TABLET BY MOUTH ONCE  DAILY FOR  CHOLESTEROL, Disp: 90 tablet, Rfl: 1 .  Cholecalciferol (VITAMIN D3) 2000 UNITS capsule, VITAMIN D3, 2000UNIT (Oral Capsule)  2 po qday for 0 days  Quantity: 60.00;  Refills: 0   Ordered :15-May-2010  Steele Sizer MD;  Started 05-October-2008 Active, Disp: , Rfl:  .  fluticasone (FLONASE) 50 MCG/ACT nasal spray, Place into the nose., Disp: , Rfl:  .  glucose blood (NOVA MAX TEST) test strip, NOVA MAX GLUCOSE TEST (In Vitro Strip)  check fsbs qam for 0 days  Quantity: 100.00;  Refills: 0   Ordered :15-May-2010  Steele Sizer MD;  Adele Barthel Active Comments: DX: 250.00, Disp: , Rfl:  .  glucose blood test strip, , Disp: ,  Rfl:  .  loratadine (CLARITIN) 10 MG tablet, Take 1 tablet (10 mg total) by mouth daily., Disp: 30 tablet, Rfl: 0 .  metFORMIN (GLUCOPHAGE) 500 MG tablet, Take 1 tablet (500 mg total) by mouth every evening., Disp: 90 tablet, Rfl: 1 .  telmisartan-hydrochlorothiazide (MICARDIS HCT) 80-25 MG tablet, Take 1 tablet by mouth daily., Disp: 90 tablet, Rfl: 1  Allergies  Allergen Reactions  . Oxycodone Other (See Comments)  . Oxycodone Hcl     insomnia, agitation     ROS  Constitutional: Negative for fever, mild  weight change.  Respiratory: Negative for cough and shortness of breath.   Cardiovascular: Negative for chest pain or palpitations.  Gastrointestinal: Negative for abdominal pain, no bowel changes.  Musculoskeletal: Negative for gait problem or joint swelling.  Skin: Negative for rash.  Neurological: Negative for dizziness or headache.  No other specific complaints in a complete review of systems (except as listed in HPI above).   Objective  Vitals:   01/22/16 0804  BP: 124/74  Pulse: 92  Resp: 18  Temp: 98.2 F (36.8 C)  SpO2: 99%  Weight: 224 lb 7 oz (101.8 kg)  Height: 5\' 1"  (1.549 m)    Body mass index is 42.41 kg/m.  Physical Exam  Constitutional: Patient appears well-developed and well-nourished. Obese  No distress.  HEENT: head atraumatic, normocephalic, pupils equal and reactive to light, neck supple, throat within normal limits Cardiovascular: Normal rate, regular rhythm and normal heart sounds.  No murmur heard. Trace BLE edema. Pulmonary/Chest: Effort normal and breath sounds normal. No respiratory distress. Abdominal: Soft.  There is no tenderness. Psychiatric: Patient has a normal mood and affect. behavior is normal. Judgment and thought content normal.   PHQ2/9: Depression screen [facility] 2/9 01/22/2016 09/20/2015 05/23/2015 01/17/2015 11/17/2014  Decreased Interest 0 0 0 0 0  Down, Depressed, Hopeless 0 0 0 0 0  PHQ - 2 Score 0 0 0 0 0     Fall  Risk: Fall Risk  01/22/2016 09/20/2015 05/23/2015 01/17/2015 11/17/2014  Falls in the past year? No No No No No     Functional Status Survey: Is the patient deaf or have difficulty hearing?: No Does the patient have difficulty seeing, even when wearing glasses/contacts?: Yes Does the patient have difficulty concentrating, remembering, or making decisions?: No Does the patient have difficulty walking or climbing stairs?: No Does the patient have difficulty dressing or bathing?: No Does the patient have difficulty doing errands alone such as visiting a doctor's office or shopping?: No    Assessment & Plan  1. Controlled type 2 diabetes mellitus with stage 2 chronic kidney disease, without long-term current use of insulin (HCC)  - POCT glycosylated hemoglobin (Hb A1C)  2. Benign hypertension  - COMPLETE METABOLIC PANEL  WITH GFR - amLODipine (NORVASC) 10 MG tablet; Take 1 tablet (10 mg total) by mouth daily.  Dispense: 90 tablet; Refill: 1 - telmisartan-hydrochlorothiazide (MICARDIS HCT) 80-25 MG tablet; Take 1 tablet by mouth daily.  Dispense: 90 tablet; Refill: 1  3. Dyslipidemia  - Lipid panel - atorvastatin (LIPITOR) 40 MG tablet; TAKE ONE TABLET BY MOUTH ONCE DAILY FOR  CHOLESTEROL  Dispense: 90 tablet; Refill: 1  4. Morbid obesity, unspecified obesity type [facility])  Discussed with the patient the risk posed by an increased BMI. Discussed importance of portion control, calorie counting and at least 150 minutes of physical activity weekly. Avoid sweet beverages and drink more water. Eat at least 6 servings of fruit and vegetables daily   5. 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  Dispense: 90 tablet; Refill: 1  6. Primary osteoarthritis of both knees  Taking Tylenol prn   7. Gastro-esophageal reflux  disease without esophagitis  Well controlled  8. Other seasonal allergic rhinitis  - loratadine (CLARITIN) 10 MG tablet; Take 1 tablet (10 mg total) by mouth daily.  Dispense: 30 tablet; Refill: 0

## 2016-01-29 ENCOUNTER — Other Ambulatory Visit: Payer: Self-pay | Admitting: Family Medicine

## 2016-02-15 ENCOUNTER — Other Ambulatory Visit: Payer: Self-pay | Admitting: Family Medicine

## 2016-02-15 DIAGNOSIS — E785 Hyperlipidemia, unspecified: Secondary | ICD-10-CM | POA: Diagnosis not present

## 2016-02-15 DIAGNOSIS — I10 Essential (primary) hypertension: Secondary | ICD-10-CM | POA: Diagnosis not present

## 2016-02-16 LAB — CMP14+EGFR
ALK PHOS: 81 IU/L (ref 39–117)
ALT: 30 IU/L (ref 0–32)
AST: 27 IU/L (ref 0–40)
Albumin/Globulin Ratio: 1.6 (ref 1.2–2.2)
Albumin: 4.6 g/dL (ref 3.6–4.8)
BUN/Creatinine Ratio: 12 (ref 12–28)
BUN: 12 mg/dL (ref 8–27)
Bilirubin Total: 0.4 mg/dL (ref 0.0–1.2)
CHLORIDE: 95 mmol/L — AB (ref 96–106)
CO2: 22 mmol/L (ref 18–29)
CREATININE: 1.03 mg/dL — AB (ref 0.57–1.00)
Calcium: 10.7 mg/dL — ABNORMAL HIGH (ref 8.7–10.3)
GFR calc Af Amer: 65 mL/min/{1.73_m2} (ref >59)
GFR calc non Af Amer: 57 mL/min/{1.73_m2} — ABNORMAL LOW (ref >59)
GLOBULIN, TOTAL: 2.9 g/dL (ref 1.5–4.5)
GLUCOSE: 100 mg/dL — AB (ref 65–99)
Potassium: 4 mmol/L (ref 3.5–5.2)
SODIUM: 138 mmol/L (ref 134–144)
Total Protein: 7.5 g/dL (ref 6.0–8.5)

## 2016-02-16 LAB — LIPID PANEL W/O CHOL/HDL RATIO
CHOLESTEROL TOTAL: 156 mg/dL (ref 100–199)
HDL: 46 mg/dL (ref >39)
LDL CALC: 86 mg/dL (ref 0–99)
TRIGLYCERIDES: 118 mg/dL (ref 0–149)
VLDL Cholesterol Cal: 24 mg/dL (ref 5–40)

## 2016-02-20 ENCOUNTER — Other Ambulatory Visit: Payer: Self-pay | Admitting: Family Medicine

## 2016-02-20 ENCOUNTER — Ambulatory Visit (INDEPENDENT_AMBULATORY_CARE_PROVIDER_SITE_OTHER): Payer: Medicare Other | Admitting: Family Medicine

## 2016-02-20 ENCOUNTER — Encounter: Payer: Self-pay | Admitting: Family Medicine

## 2016-02-20 VITALS — BP 142/76 | HR 99 | Temp 98.0°F | Resp 16 | Ht 61.25 in | Wt 222.4 lb

## 2016-02-20 DIAGNOSIS — Z0000 Encounter for general adult medical examination without abnormal findings: Secondary | ICD-10-CM

## 2016-02-20 DIAGNOSIS — Z124 Encounter for screening for malignant neoplasm of cervix: Secondary | ICD-10-CM

## 2016-02-20 DIAGNOSIS — Z1231 Encounter for screening mammogram for malignant neoplasm of breast: Secondary | ICD-10-CM | POA: Diagnosis not present

## 2016-02-20 DIAGNOSIS — R87610 Atypical squamous cells of undetermined significance on cytologic smear of cervix (ASC-US): Secondary | ICD-10-CM | POA: Diagnosis not present

## 2016-02-20 DIAGNOSIS — L729 Follicular cyst of the skin and subcutaneous tissue, unspecified: Secondary | ICD-10-CM

## 2016-02-20 DIAGNOSIS — Z23 Encounter for immunization: Secondary | ICD-10-CM | POA: Diagnosis not present

## 2016-02-20 NOTE — Progress Notes (Signed)
Name: Yvonne Curtis   MRN: [id number]    DOB: 10/27/1950   Date:02/20/2016       Progress Note  Subjective  Chief Complaint  Chief Complaint  Patient presents with  . Annual Exam    Medicare Wellness Exam    HPI  Functional ability/safety issues: No Issues, she tried going back to work part time, but caused too much pain on her knees from walking on cement floor Hearing issues: Addressed  Activities of daily living: Discussed Home safety issues: No Issues  End Of Life Planning: Offered verbal information regarding advanced directives, healthcare power of attorney.  Preventative care, Health maintenance, Preventative health measures discussed.  Preventative screenings discussed today: lab work, colonoscopy,  mammogram, DEXA - refused.  Low Dose CT Chest recommended if Age 55-80 years, 30 pack-year currently smoking OR have quit w/in 15years.   Lifestyle risk factor issued reviewed: Diet, exercise, weight management, advised patient smoking is not healthy, nutrition/diet.  Preventative health measures discussed (5-10 year plan).  Reviewed and recommended vaccinations: - Pneumovax  - Prevnar  - Annual Influenza - Zostavax - Tdap   Depression screening: Done Fall risk screening: Done Discuss ADLs/IADLs: Done  Current medical providers: See HPI  Other health risk factors identified this visit: No other issues Cognitive impairment issues: None identified  All above discussed with patient. Appropriate education, counseling and referral will be made based upon the above.    Hypercalcemia: she denies muscle pain or spasms    Patient Active Problem List   Diagnosis Date Noted  . Hematuria 02/17/2015  . Acanthosis nigricans 11/14/2014  . Benign hypertension 11/14/2014  . Dyslipidemia 11/14/2014  . Gastro-esophageal reflux disease without esophagitis 11/14/2014  . Extreme obesity (HCC) 11/14/2014  . Primary osteoarthritis of both knees 11/14/2014  .  Microalbuminuria 10/15/2012  . Diabetes mellitus with renal manifestations, controlled (HCC) 06/05/2009  . Body dermatophytosis 12/13/2008    Past Surgical History:  Procedure Laterality Date  . COLONOSCOPY WITH PROPOFOL N/A 11/30/2014   Procedure: COLONOSCOPY WITH PROPOFOL;  Surgeon: Darren Wohl, MD;  Location: [facility];  Service: Endoscopy;  Laterality: N/A;  WITH BIOPSY-- TRANSVERSE COLON POLYP  X 2 DESCENING COLON POLYP  . GALLBLADDER SURGERY    . SEPTOPLASTY    . tonsillectomy    . TUBAL LIGATION      Family History  Problem Relation Age of Onset  . Colon cancer Mother   . Lung cancer Father   . Hypertension Sister   . Hypertension Sister     Social History   Social History  . Marital status: Married    Spouse name: N/A  . Number of children: N/A  . Years of education: N/A   Occupational History  . Not on file.   Social History Main Topics  . Smoking status: Never Smoker  . Smokeless tobacco: Never Used  . Alcohol use No  . Drug use: No  . Sexual activity: Yes   Other Topics Concern  . Not on file   Social History Narrative  . No narrative on file     Current Outpatient Prescriptions:  .  acetaminophen (TYLENOL) 500 MG tablet, Take by mouth., Disp: , Rfl:  .  amLODipine (NORVASC) 10 MG tablet, Take 1 tablet (10 mg total) by mouth daily., Disp: 90 tablet, Rfl: 1 .  aspirin EC 81 MG tablet, Take by mouth., Disp: , Rfl:  .  atorvastatin (LIPITOR) 40 MG tablet, TAKE ONE TABLET BY MOUTH ONCE DAILY FOR  CHOLESTEROL,   Disp: 90 tablet, Rfl: 1 .  Cholecalciferol (VITAMIN D3) 2000 UNITS capsule, VITAMIN D3, 2000UNIT (Oral Capsule)  2 po qday for 0 days  Quantity: 60.00;  Refills: 0   Ordered :15-May-2010  Steele Sizer MD;  Started 05-October-2008 Active, Disp: , Rfl:  .  fluticasone (FLONASE) 50 MCG/ACT nasal spray, Place into the nose., Disp: , Rfl:  .  glucose blood (NOVA MAX TEST) test strip, NOVA MAX GLUCOSE TEST (In Vitro Strip)  check fsbs qam for 0  days  Quantity: 100.00;  Refills: 0   Ordered :15-May-2010  Steele Sizer MD;  Adele Barthel Active Comments: DX: 250.00, Disp: , Rfl:  .  glucose blood test strip, , Disp: , Rfl:  .  loratadine (CLARITIN) 10 MG tablet, Take 1 tablet (10 mg total) by mouth daily., Disp: 30 tablet, Rfl: 0 .  metFORMIN (GLUCOPHAGE) 500 MG tablet, TAKE 1 TABLET BY MOUTH  EVERY EVENING, Disp: 90 tablet, Rfl: 1 .  telmisartan-hydrochlorothiazide (MICARDIS HCT) 80-25 MG tablet, Take 1 tablet by mouth daily., Disp: 90 tablet, Rfl: 1  Allergies  Allergen Reactions  . Oxycodone Other (See Comments)  . Oxycodone Hcl     insomnia, agitation     ROS  Constitutional: Negative for fever or significant weight change.  Respiratory: Negative for cough and shortness of breath.   Cardiovascular: Negative for chest pain or palpitations.  Gastrointestinal: Negative for abdominal pain, no bowel changes.  Musculoskeletal: Negative  for gait problem or joint swelling.  Skin: Negative for rash.  Neurological: Negative for dizziness or headache.  No other specific complaints in a complete review of systems (except as listed in HPI above).  Objective  Vitals:   02/20/16 0835  BP: (!) 142/76  Pulse: 99  Resp: 16  Temp: 98 F (36.7 C)  TempSrc: Oral  SpO2: 95%  Weight: 222 lb 6.4 oz (100.9 kg)  Height: 5' 1.25" (1.556 m)    Body mass index is 41.68 kg/m.  Physical Exam  Constitutional: Patient appears well-developed and obese.  No distress.  HENT: Head: Normocephalic and atraumatic. Ears: B TMs ok, no erythema or effusion; Nose: Nose normal. Mouth/Throat: Oropharynx is clear and moist. No oropharyngeal exudate.  Eyes: Conjunctivae and EOM are normal. Pupils are equal, round, and reactive to light. No scleral icterus.  Neck: Normal range of motion. Neck supple. No JVD present. No thyromegaly present.  Cardiovascular: Normal rate, regular rhythm and normal heart sounds.  No murmur heard. No BLE  edema. Pulmonary/Chest: Effort normal and breath sounds normal. No respiratory distress. Abdominal: Soft. Bowel sounds are normal, no distension. There is no tenderness. no masses Breast: no lumps or masses, no nipple discharge or rashes FEMALE GENITALIA:  External genitalia normal External urethra normal Vaginal vault normal without discharge or lesions Cervix normal without discharge or lesions Bimanual exam normal without masses RECTAL: normal external exam  Musculoskeletal: Normal range of motion, no joint effusions. No gross deformities. Crepitus with extension of right knee Neurological: he is alert and oriented to person, place, and time. No cranial nerve deficit. Coordination, balance, strength, speech and gait are normal.  Skin: Skin is warm and dry. No rash noted. No erythema. Pea size nodule on LLQ, also some nodulation of fatty tissue on abdominal fold Psychiatric: Patient has a normal mood and affect. behavior is normal. Judgment and thought content normal.    Recent Results (from the past 2160 hour(s))  POCT glycosylated hemoglobin (Hb A1C)     Status: None   Collection Time:  01/22/16  9:45 AM  Result Value Ref Range   Hemoglobin A1C 6.4   CMP14+EGFR     Status: Abnormal   Collection Time: 02/15/16  9:06 AM  Result Value Ref Range   Glucose 100 (H) 65 - 99 mg/dL   BUN 12 8 - 27 mg/dL   Creatinine, Ser 1.03 (H) 0.57 - 1.00 mg/dL   GFR calc non Af Amer 57 (L) >59 mL/min/1.73   GFR calc Af Amer 65 >59 mL/min/1.73   BUN/Creatinine Ratio 12 12 - 28   Sodium 138 134 - 144 mmol/L   Potassium 4.0 3.5 - 5.2 mmol/L   Chloride 95 (L) 96 - 106 mmol/L   CO2 22 18 - 29 mmol/L   Calcium 10.7 (H) 8.7 - 10.3 mg/dL   Total Protein 7.5 6.0 - 8.5 g/dL   Albumin 4.6 3.6 - 4.8 g/dL   Globulin, Total 2.9 1.5 - 4.5 g/dL   Albumin/Globulin Ratio 1.6 1.2 - 2.2   Bilirubin Total 0.4 0.0 - 1.2 mg/dL   Alkaline Phosphatase 81 39 - 117 IU/L   AST 27 0 - 40 IU/L   ALT 30 0 - 32 IU/L   Lipid Panel w/o Chol/HDL Ratio     Status: None   Collection Time: 02/15/16  9:06 AM  Result Value Ref Range   Cholesterol, Total 156 100 - 199 mg/dL   Triglycerides 118 0 - 149 mg/dL   HDL 46 >39 mg/dL   VLDL Cholesterol Cal 24 5 - 40 mg/dL   LDL Calculated 86 0 - 99 mg/dL     PHQ2/9: Depression screen PHQ 2/9 01/22/2016 09/20/2015 05/23/2015 01/17/2015 11/17/2014  Decreased Interest 0 0 0 0 0  Down, Depressed, Hopeless 0 0 0 0 0  PHQ - 2 Score 0 0 0 0 0     Fall Risk: Fall Risk  01/22/2016 09/20/2015 05/23/2015 01/17/2015 11/17/2014  Falls in the past year? No No No No No      Assessment & Plan  1. Medicare annual wellness visit, subsequent  Discussed with child and caregiver the importance of limiting screen time to no more than 2 hours per day, exercise daily for at least 2 hours, eat 6 servings of fruit and vegetables daily, eat tree nuts ( pistachios, pecans , almonds...) one serving every other day, eat fish twice weekly, read daily for at least 20 minutes, help with chores at home.  2. Hypercalcemia  She wants to recheck next visit   3. Encounter for screening mammogram for breast cancer  She will schedule appointment   4. Need for vaccination for Strep pneumoniae  - Pneumococcal polysaccharide vaccine 23-valent greater than or equal to 2yo subcutaneous/IM  5. Encounter for screening for cervical cancer   - PapLb, HPV, rfx16/18  6. Cyst of subcutaneous tissue  Non tender, pea size on LLQ, no redness, gave her reassurance but advised her to call back for excision if changes such as growth, pain, redness over the area 

## 2016-02-29 LAB — PAPLB, HPV, RFX16/18
HPV, HIGH-RISK: NEGATIVE
PAP Smear Comment: 0

## 2016-05-23 ENCOUNTER — Ambulatory Visit: Payer: Medicare Other | Admitting: Family Medicine

## 2016-05-27 ENCOUNTER — Encounter: Payer: Self-pay | Admitting: Family Medicine

## 2016-05-27 ENCOUNTER — Ambulatory Visit (INDEPENDENT_AMBULATORY_CARE_PROVIDER_SITE_OTHER): Payer: Medicare Other | Admitting: Family Medicine

## 2016-05-27 VITALS — BP 118/66 | HR 74 | Temp 98.3°F | Resp 18 | Ht 61.0 in | Wt 218.9 lb

## 2016-05-27 DIAGNOSIS — E785 Hyperlipidemia, unspecified: Secondary | ICD-10-CM | POA: Diagnosis not present

## 2016-05-27 DIAGNOSIS — E1122 Type 2 diabetes mellitus with diabetic chronic kidney disease: Secondary | ICD-10-CM | POA: Diagnosis not present

## 2016-05-27 DIAGNOSIS — I10 Essential (primary) hypertension: Secondary | ICD-10-CM | POA: Diagnosis not present

## 2016-05-27 DIAGNOSIS — N182 Chronic kidney disease, stage 2 (mild): Secondary | ICD-10-CM | POA: Diagnosis not present

## 2016-05-27 LAB — POCT UA - MICROALBUMIN: Microalbumin Ur, POC: 20 mg/L

## 2016-05-27 LAB — POCT GLYCOSYLATED HEMOGLOBIN (HGB A1C): Hemoglobin A1C: 6.4

## 2016-05-27 MED ORDER — ATORVASTATIN CALCIUM 40 MG PO TABS: ORAL_TABLET | ORAL | 1 refills | Status: DC

## 2016-05-27 MED ORDER — METFORMIN HCL 500 MG PO TABS
500.0000 mg | ORAL_TABLET | Freq: Every evening | ORAL | 1 refills | Status: DC
Start: 2016-05-27 — End: 2016-10-18

## 2016-05-27 MED ORDER — TELMISARTAN-HCTZ 80-25 MG PO TABS: 1.0000 | ORAL_TABLET | Freq: Every day | ORAL | 1 refills | Status: DC

## 2016-05-27 MED ORDER — AMLODIPINE BESYLATE 10 MG PO TABS: 10.0000 mg | ORAL_TABLET | Freq: Every day | ORAL | 1 refills | Status: DC

## 2016-05-27 NOTE — Progress Notes (Signed)
Name: Yvonne Curtis   MRN: [id number]    DOB: Jul 27, 1950   Date:05/27/2016       Progress Note  Subjective  Chief Complaint  Chief Complaint  Patient presents with  . Medication Refill    4 month F/U  . Diabetes  . Hypertension    denies any symptoms  . Hyperlipidemia  . Allergic Rhinitis     Only takes medication as needed    HPI  DMII with renal manifestation: she has been taking Metformin daily and denies side effects, she occasionally gets glucose checked at home, but not sure of values. She denies polyphagia, polyuria or polydipsia. She states recently had an eye exam and we will obtain records.  Normal urine micro today, used to be high in the past. She is on ARB - denies side effects.  HTN: she has been taking her medications as prescribed and denies side effects. Denies orthostatic changes, chest pain or palpitation  Hyperlipidemia: Taking Lipitor and denies side effects. No myalgia. Reviewed last labs.   Obesity: She has been using her stationary bike a couple times a week for about 20 minutes, lost 4 lbs since last visit, discussed dietary modification and also increasing in physical activity   OA knee: she states her knees no longer bothering her since she stopped working. No longer doing the part time job at [employer]: she is doing well on Flonase and prn Loratadine, denies sneezing or rhinorrhea, she has occasional congestion    Patient Active Problem List   Diagnosis Date Noted  . Hematuria 02/17/2015  . Acanthosis nigricans 11/14/2014  . Benign hypertension 11/14/2014  . Dyslipidemia 11/14/2014  . Gastro-esophageal reflux disease without esophagitis 11/14/2014  . Extreme obesity ([place]) 11/14/2014  . Primary osteoarthritis of both knees 11/14/2014  . Microalbuminuria 10/15/2012  . Diabetes mellitus with renal manifestations, controlled ([place]) 06/05/2009  . Body dermatophytosis 12/13/2008    Past Surgical History:  Procedure Laterality Date  .  COLONOSCOPY WITH PROPOFOL N/A 11/30/2014   Procedure: COLONOSCOPY WITH PROPOFOL;  Surgeon: Lucilla Lame, MD;  Location: [place];  Service: Endoscopy;  Laterality: N/A;  WITH BIOPSY-- TRANSVERSE COLON POLYP  X 2 DESCENING COLON POLYP  . GALLBLADDER SURGERY    . SEPTOPLASTY    . tonsillectomy    . TUBAL LIGATION      Family History  Problem Relation Age of Onset  . Colon cancer Mother   . Lung cancer Father   . Hypertension Sister   . Hypertension Sister     Social History   Social History  . Marital status: Married    Spouse name: N/A  . Number of children: N/A  . Years of education: N/A   Occupational History  . Not on file.   Social History Main Topics  . Smoking status: Never Smoker  . Smokeless tobacco: Never Used  . Alcohol use No  . Drug use: No  . Sexual activity: Yes   Other Topics Concern  . Not on file   Social History Narrative  . No narrative on file     Current Outpatient Prescriptions:  .  acetaminophen (TYLENOL) 500 MG tablet, Take by mouth., Disp: , Rfl:  .  amLODipine (NORVASC) 10 MG tablet, Take 1 tablet (10 mg total) by mouth daily., Disp: 90 tablet, Rfl: 1 .  aspirin EC 81 MG tablet, Take by mouth., Disp: , Rfl:  .  atorvastatin (LIPITOR) 40 MG tablet, TAKE ONE TABLET BY MOUTH ONCE DAILY  FOR  CHOLESTEROL, Disp: 90 tablet, Rfl: 1 .  Cholecalciferol (VITAMIN D3) 2000 UNITS capsule, VITAMIN D3, 2000UNIT (Oral Capsule)  2 po qday for 0 days  Quantity: 60.00;  Refills: 0   Ordered :15-May-2010  Steele Sizer MD;  Started 05-October-2008 Active, Disp: , Rfl:  .  fluticasone (FLONASE) 50 MCG/ACT nasal spray, Place into the nose., Disp: , Rfl:  .  glucose blood test strip, , Disp: , Rfl:  .  metFORMIN (GLUCOPHAGE) 500 MG tablet, Take 1 tablet (500 mg total) by mouth every evening., Disp: 90 tablet, Rfl: 1 .  telmisartan-hydrochlorothiazide (MICARDIS HCT) 80-25 MG tablet, Take 1 tablet by mouth daily., Disp: 90 tablet, Rfl: 1 .  loratadine  (CLARITIN) 10 MG tablet, Take 1 tablet (10 mg total) by mouth daily. (Patient not taking: Reported on 05/27/2016), Disp: 30 tablet, Rfl: 0  Allergies  Allergen Reactions  . Oxycodone Other (See Comments)  . Oxycodone Hcl     insomnia, agitation     ROS  Constitutional: Negative for fever or significant weight change.  Respiratory: Negative for cough and shortness of breath.   Cardiovascular: Negative for chest pain or palpitations.  Gastrointestinal: Negative for abdominal pain, no bowel changes.  Musculoskeletal: Negative for gait problem or joint swelling.  Skin: Negative for rash.  Neurological: Negative for dizziness or headache.  No other specific complaints in a complete review of systems (except as listed in HPI above).  Objective  Vitals:   05/27/16 1011  BP: 118/66  Pulse: 74  Resp: 18  Temp: 98.3 F (36.8 C)  TempSrc: Oral  SpO2: 96%  Weight: 218 lb 14.4 oz (99.3 kg)  Height: 5\' 1"  (1.549 m)    Body mass index is 41.36 kg/m.  Physical Exam  Constitutional: Patient appears well-developed and well-nourished. Obese No distress.  HEENT: head atraumatic, normocephalic, pupils equal and reactive to light,  neck supple, throat within normal limits Cardiovascular: Normal rate, regular rhythm and normal heart sounds.  No murmur heard. No BLE edema. Pulmonary/Chest: Effort normal and breath sounds normal. No respiratory distress. Abdominal: Soft.  There is no tenderness. Psychiatric: Patient has a normal mood and affect. behavior is normal. Judgment and thought content normal.  Recent Results (from the past 2160 hour(s))  POCT HgB A1C     Status: None   Collection Time: 05/27/16 10:13 AM  Result Value Ref Range   Hemoglobin A1C 6.4   POCT UA - Microalbumin     Status: None   Collection Time: 05/27/16 10:13 AM  Result Value Ref Range   Microalbumin Ur, POC 20 mg/L   Creatinine, POC  mg/dL   Albumin/Creatinine Ratio, Urine, POC        PHQ2/9: Depression  screen [facility] 2/9 05/27/2016 01/22/2016 09/20/2015 05/23/2015 01/17/2015  Decreased Interest 0 0 0 0 0  Down, Depressed, Hopeless 0 0 0 0 0  PHQ - 2 Score 0 0 0 0 0     Fall Risk: Fall Risk  05/27/2016 01/22/2016 09/20/2015 05/23/2015 01/17/2015  Falls in the past year? No No No No No     Functional Status Survey: Is the patient deaf or have difficulty hearing?: No Does the patient have difficulty seeing, even when wearing glasses/contacts?: No Does the patient have difficulty concentrating, remembering, or making decisions?: No Does the patient have difficulty walking or climbing stairs?: No Does the patient have difficulty dressing or bathing?: No Does the patient have difficulty doing errands alone such as visiting a doctor's office or shopping?: No  Assessment & Plan  1. Controlled type 2 diabetes mellitus with stage 2 chronic kidney disease, without long-term current use of insulin (HCC)  - POCT HgB A1C - POCT UA - Microalbumin - telmisartan-hydrochlorothiazide (MICARDIS HCT) 80-25 MG tablet; Take 1 tablet by mouth daily.  Dispense: 90 tablet; Refill: 1 - metFORMIN (GLUCOPHAGE) 500 MG tablet; Take 1 tablet (500 mg total) by mouth every evening.  Dispense: 90 tablet; Refill: 1  2. Dyslipidemia  - atorvastatin (LIPITOR) 40 MG tablet; TAKE ONE TABLET BY MOUTH ONCE DAILY FOR  CHOLESTEROL  Dispense: 90 tablet; Refill: 1  3. Benign hypertension  - telmisartan-hydrochlorothiazide (MICARDIS HCT) 80-25 MG tablet; Take 1 tablet by mouth daily.  Dispense: 90 tablet; Refill: 1 - amLODipine (NORVASC) 10 MG tablet; Take 1 tablet (10 mg total) by mouth daily.  Dispense: 90 tablet; Refill: 1  4. Morbid obesity, unspecified obesity type [facility])  Discussed with the patient the risk posed by an increased BMI. Discussed importance of portion control, calorie counting and at least 150 minutes of physical activity weekly. Avoid sweet beverages and drink more water. Eat at least 6 servings of fruit and  vegetables daily   5. Hypercalcemia  - Calcium, ionized - VITAMIN D 25 Hydroxy (Vit-D Deficiency, Fractures)

## 2016-05-29 LAB — CALCIUM, IONIZED: CALCIUM ION: 5.5 mg/dL (ref 4.5–5.6)

## 2016-05-29 LAB — VITAMIN D 25 HYDROXY (VIT D DEFICIENCY, FRACTURES): Vit D, 25-Hydroxy: 43.4 ng/mL (ref 30.0–100.0)

## 2016-06-20 ENCOUNTER — Encounter: Payer: Self-pay | Admitting: Family Medicine

## 2016-08-27 LAB — HM DIABETES EYE EXAM

## 2016-10-01 ENCOUNTER — Encounter: Payer: Self-pay | Admitting: Family Medicine

## 2016-10-18 ENCOUNTER — Other Ambulatory Visit: Payer: Self-pay | Admitting: Family Medicine

## 2016-10-18 DIAGNOSIS — I10 Essential (primary) hypertension: Secondary | ICD-10-CM

## 2016-10-18 DIAGNOSIS — E785 Hyperlipidemia, unspecified: Secondary | ICD-10-CM

## 2016-10-18 DIAGNOSIS — N182 Chronic kidney disease, stage 2 (mild): Secondary | ICD-10-CM

## 2016-10-18 DIAGNOSIS — E1122 Type 2 diabetes mellitus with diabetic chronic kidney disease: Secondary | ICD-10-CM

## 2016-10-20 NOTE — Telephone Encounter (Signed)
Patient requesting refill of Amlodipine, Atorvastatin, Metformin and Micardis to [facility].

## 2016-11-06 DIAGNOSIS — H6122 Impacted cerumen, left ear: Secondary | ICD-10-CM | POA: Diagnosis not present

## 2016-11-06 DIAGNOSIS — H60332 Swimmer's ear, left ear: Secondary | ICD-10-CM | POA: Diagnosis not present

## 2016-11-24 ENCOUNTER — Ambulatory Visit: Payer: Medicare Other | Admitting: Family Medicine

## 2016-12-04 ENCOUNTER — Ambulatory Visit (INDEPENDENT_AMBULATORY_CARE_PROVIDER_SITE_OTHER): Payer: Medicare Other | Admitting: Family Medicine

## 2016-12-04 ENCOUNTER — Encounter: Payer: Self-pay | Admitting: Family Medicine

## 2016-12-04 ENCOUNTER — Other Ambulatory Visit: Payer: Self-pay | Admitting: Family Medicine

## 2016-12-04 VITALS — BP 128/68 | HR 91 | Temp 97.4°F | Resp 16 | Ht 61.0 in | Wt 218.4 lb

## 2016-12-04 DIAGNOSIS — I10 Essential (primary) hypertension: Secondary | ICD-10-CM | POA: Diagnosis not present

## 2016-12-04 DIAGNOSIS — N182 Chronic kidney disease, stage 2 (mild): Secondary | ICD-10-CM

## 2016-12-04 DIAGNOSIS — E785 Hyperlipidemia, unspecified: Secondary | ICD-10-CM | POA: Diagnosis not present

## 2016-12-04 DIAGNOSIS — Z1231 Encounter for screening mammogram for malignant neoplasm of breast: Secondary | ICD-10-CM

## 2016-12-04 DIAGNOSIS — E1122 Type 2 diabetes mellitus with diabetic chronic kidney disease: Secondary | ICD-10-CM | POA: Diagnosis not present

## 2016-12-04 LAB — POCT GLYCOSYLATED HEMOGLOBIN (HGB A1C): HEMOGLOBIN A1C: 6.4

## 2016-12-04 MED ORDER — ATORVASTATIN CALCIUM 40 MG PO TABS: 40.0000 mg | ORAL_TABLET | Freq: Every day | ORAL | 1 refills | Status: DC

## 2016-12-04 MED ORDER — TELMISARTAN-HCTZ 80-25 MG PO TABS: 1.0000 | ORAL_TABLET | Freq: Every day | ORAL | 1 refills | Status: DC

## 2016-12-04 MED ORDER — METFORMIN HCL 500 MG PO TABS: 500.0000 mg | ORAL_TABLET | Freq: Every evening | ORAL | 1 refills | Status: DC

## 2016-12-04 MED ORDER — AMLODIPINE BESYLATE 10 MG PO TABS: 10.0000 mg | ORAL_TABLET | Freq: Every day | ORAL | 1 refills | Status: DC

## 2016-12-04 NOTE — Progress Notes (Signed)
Name: Yvonne Curtis   MRN: [id number]    DOB: July 30, 1950   Date:12/04/2016       Progress Note  Subjective  Chief Complaint  Chief Complaint  Patient presents with  . Medication Refill    6 month F/U  . Diabetes    Needs a new meter, has not been checking her sugar at home.  Marland Kitchen Hypertension    Denies any symptoms  . Hyperlipidemia  . Allergic Rhinitis     HPI  DMII with renal manifestation: she has been taking Metformin daily and denies side effects, she occasionally gets glucose checked at home but lost her meter, she will find out the name of the meter she would like to get filled.  She denies polyphagia, polyuria or polydipsia. Last urine micro today, used to be high in the past. She is on ARB - denies side effects. She has been eating healthy.   HTN: she has been taking her medications as prescribed and denies side effects. Denies orthostatic changes, chest pain or palpitation. Weight is stable, but eating healthier  Hyperlipidemia: Taking Lipitor and denies side effects. No myalgia. Recheck labs on her next visit   Obesity: She has been using her stationary bike a couple times a week for about 20 minutes, lost 1 lbs since last visit, discussed dietary modification and also increasing in physical activity . She is drinking more water  OA knee: she states her knees no longer bothering her since she stopped working.   Patient Active Problem List   Diagnosis Date Noted  . Hematuria 02/17/2015  . Acanthosis nigricans 11/14/2014  . Benign hypertension 11/14/2014  . Dyslipidemia 11/14/2014  . Gastro-esophageal reflux disease without esophagitis 11/14/2014  . Extreme obesity 11/14/2014  . Primary osteoarthritis of both knees 11/14/2014  . Microalbuminuria 10/15/2012  . Diabetes mellitus with renal manifestations, controlled ([address]) 06/05/2009  . Body dermatophytosis 12/13/2008    Past Surgical History:  Procedure Laterality Date  . COLONOSCOPY WITH PROPOFOL N/A  11/30/2014   Procedure: COLONOSCOPY WITH PROPOFOL;  Surgeon: Lucilla Lame, MD;  Location: [facility];  Service: Endoscopy;  Laterality: N/A;  WITH BIOPSY-- TRANSVERSE COLON POLYP  X 2 DESCENING COLON POLYP  . GALLBLADDER SURGERY    . SEPTOPLASTY    . tonsillectomy    . TUBAL LIGATION      Family History  Problem Relation Age of Onset  . Colon cancer Mother   . Lung cancer Father   . Hypertension Sister   . Hypertension Sister     Social History   Social History  . Marital status: Married    Spouse name: N/A  . Number of children: N/A  . Years of education: N/A   Occupational History  . Not on file.   Social History Main Topics  . Smoking status: Never Smoker  . Smokeless tobacco: Never Used  . Alcohol use No  . Drug use: No  . Sexual activity: Yes   Other Topics Concern  . Not on file   Social History Narrative  . No narrative on file     Current Outpatient Prescriptions:  .  acetaminophen (TYLENOL) 500 MG tablet, Take by mouth., Disp: , Rfl:  .  amLODipine (NORVASC) 10 MG tablet, Take 1 tablet (10 mg total) by mouth daily., Disp: 90 tablet, Rfl: 1 .  aspirin EC 81 MG tablet, Take by mouth., Disp: , Rfl:  .  atorvastatin (LIPITOR) 40 MG tablet, Take 1 tablet (40 mg total) by mouth daily.,  Disp: 90 tablet, Rfl: 1 .  Cholecalciferol (VITAMIN D3) 2000 UNITS capsule, VITAMIN D3, 2000UNIT (Oral Capsule)  2 po qday for 0 days  Quantity: 60.00;  Refills: 0   Ordered :15-May-2010  Steele Sizer MD;  Started 05-October-2008 Active, Disp: , Rfl:  .  fluticasone (FLONASE) 50 MCG/ACT nasal spray, Place into the nose., Disp: , Rfl:  .  glucose blood test strip, , Disp: , Rfl:  .  loratadine (CLARITIN) 10 MG tablet, Take 1 tablet (10 mg total) by mouth daily., Disp: 30 tablet, Rfl: 0 .  metFORMIN (GLUCOPHAGE) 500 MG tablet, Take 1 tablet (500 mg total) by mouth every evening., Disp: 90 tablet, Rfl: 1 .  telmisartan-hydrochlorothiazide (MICARDIS HCT) 80-25 MG tablet, Take  1 tablet by mouth daily., Disp: 90 tablet, Rfl: 1  Allergies  Allergen Reactions  . Oxycodone Other (See Comments)  . Oxycodone Hcl     insomnia, agitation     ROS  Constitutional: Negative for fever or weight change.  Respiratory: Negative for cough and shortness of breath.   Cardiovascular: Negative for chest pain or palpitations.  Gastrointestinal: Negative for abdominal pain, no bowel changes.  Musculoskeletal: Negative for gait problem or joint swelling.  Skin: Negative for rash.  Neurological: Negative for dizziness or headache.  No other specific complaints in a complete review of systems (except as listed in HPI above).  Objective  Vitals:   12/04/16 0905  BP: 128/68  Pulse: 91  Resp: 16  Temp: (!) 97.4 F (36.3 C)  TempSrc: Oral  SpO2: 97%  Weight: 218 lb 6.4 oz (99.1 kg)  Height: 5\' 1"  (1.549 m)    Body mass index is 41.27 kg/m.  Physical Exam  Constitutional: Patient appears well-developed and well-nourished. Obese  No distress.  HEENT: head atraumatic, normocephalic, pupils equal and reactive to light, neck supple, throat within normal limits Cardiovascular: Normal rate, regular rhythm and normal heart sounds.  No murmur heard. No BLE edema. Pulmonary/Chest: Effort normal and breath sounds normal. No respiratory distress. Abdominal: Soft.  There is no tenderness. Psychiatric: Patient has a normal mood and affect. behavior is normal. Judgment and thought content normal.  Recent Results (from the past 2160 hour(s))  POCT HgB A1C     Status: None   Collection Time: 12/04/16  9:13 AM  Result Value Ref Range   Hemoglobin A1C 6.4     Diabetic Foot Exam: Diabetic Foot Exam - Simple   Simple Foot Form Diabetic Foot exam was performed with the following findings:  Yes 12/04/2016  9:37 AM  Visual Inspection No deformities, no ulcerations, no other skin breakdown bilaterally:  Yes Sensation Testing Intact to touch and monofilament testing bilaterally:   Yes Pulse Check Posterior Tibialis and Dorsalis pulse intact bilaterally:  Yes Comments     PHQ2/9: Depression screen [facility] 2/9 12/04/2016 05/27/2016 01/22/2016 09/20/2015 05/23/2015  Decreased Interest 0 0 0 0 0  Down, Depressed, Hopeless 0 0 0 0 0  PHQ - 2 Score 0 0 0 0 0     Fall Risk: Fall Risk  12/04/2016 05/27/2016 01/22/2016 09/20/2015 05/23/2015  Falls in the past year? No No No No No     Functional Status Survey: Is the patient deaf or have difficulty hearing?: No Does the patient have difficulty seeing, even when wearing glasses/contacts?: No Does the patient have difficulty concentrating, remembering, or making decisions?: No Does the patient have difficulty walking or climbing stairs?: No Does the patient have difficulty dressing or bathing?: No Does the  patient have difficulty doing errands alone such as visiting a doctor's office or shopping?: No    Assessment & Plan  1. Controlled type 2 diabetes mellitus with stage 2 chronic kidney disease, without long-term current use of insulin (HCC)  - POCT HgB A1C - telmisartan-hydrochlorothiazide (MICARDIS HCT) 80-25 MG tablet; Take 1 tablet by mouth daily.  Dispense: 90 tablet; Refill: 1 - metFORMIN (GLUCOPHAGE) 500 MG tablet; Take 1 tablet (500 mg total) by mouth every evening.  Dispense: 90 tablet; Refill: 1  2. Dyslipidemia  - atorvastatin (LIPITOR) 40 MG tablet; Take 1 tablet (40 mg total) by mouth daily.  Dispense: 90 tablet; Refill: 1  3. Benign hypertension  - telmisartan-hydrochlorothiazide (MICARDIS HCT) 80-25 MG tablet; Take 1 tablet by mouth daily.  Dispense: 90 tablet; Refill: 1 - amLODipine (NORVASC) 10 MG tablet; Take 1 tablet (10 mg total) by mouth daily.  Dispense: 90 tablet; Refill: 1  4. Morbid obesity, unspecified obesity type [facility])  Discussed with the patient the risk posed by an increased BMI. Discussed importance of portion control, calorie counting and at least 150 minutes of physical activity weekly.  Avoid sweet beverages and drink more water. Eat at least 6 servings of fruit and vegetables daily

## 2016-12-19 ENCOUNTER — Ambulatory Visit
Admission: RE | Admit: 2016-12-19 | Discharge: 2016-12-19 | Disposition: A | Discharge status: Home / Self Care | Payer: Medicare Other | Source: Ambulatory Visit | Attending: Family Medicine | Admitting: Family Medicine

## 2016-12-19 DIAGNOSIS — Z1231 Encounter for screening mammogram for malignant neoplasm of breast: Secondary | ICD-10-CM

## 2017-03-04 ENCOUNTER — Encounter: Payer: Self-pay | Admitting: Family Medicine

## 2017-03-04 ENCOUNTER — Ambulatory Visit (INDEPENDENT_AMBULATORY_CARE_PROVIDER_SITE_OTHER): Payer: Medicare Other | Admitting: Family Medicine

## 2017-03-04 VITALS — BP 130/70 | HR 97 | Temp 98.2°F | Resp 14 | Ht 61.0 in | Wt 210.8 lb

## 2017-03-04 DIAGNOSIS — N182 Chronic kidney disease, stage 2 (mild): Secondary | ICD-10-CM

## 2017-03-04 DIAGNOSIS — Z79899 Other long term (current) drug therapy: Secondary | ICD-10-CM

## 2017-03-04 DIAGNOSIS — E559 Vitamin D deficiency, unspecified: Secondary | ICD-10-CM

## 2017-03-04 DIAGNOSIS — E785 Hyperlipidemia, unspecified: Secondary | ICD-10-CM | POA: Diagnosis not present

## 2017-03-04 DIAGNOSIS — E1122 Type 2 diabetes mellitus with diabetic chronic kidney disease: Secondary | ICD-10-CM | POA: Diagnosis not present

## 2017-03-04 DIAGNOSIS — Z0000 Encounter for general adult medical examination without abnormal findings: Secondary | ICD-10-CM | POA: Diagnosis not present

## 2017-03-04 DIAGNOSIS — R319 Hematuria, unspecified: Secondary | ICD-10-CM

## 2017-03-04 DIAGNOSIS — Z124 Encounter for screening for malignant neoplasm of cervix: Secondary | ICD-10-CM

## 2017-03-04 NOTE — Progress Notes (Signed)
Patient: Yvonne Curtis, Female    DOB: November 06, 1950, 66 y.o.   MRN: [id number]  Visit Date: 03/04/2017  Today's Provider: Loistine Chance, MD   Chief Complaint  Patient presents with  . Medicare Wellness  . Hypertension  . Hyperlipidemia  . Diabetes    Subjective:    HPI   Yvonne Curtis is a 66 y.o. female who presents today for her Subsequent Annual Wellness Visit.  Patient/Caregiver input:  Needs regular follow up and refills  DMII with renal manifestation: she has been taking Metformin daily and denies side effects, checking fsbs occasionally, but not recently.  She denies polyphagia, polyuria or polydipsia. Last urine micro was normal , used to be high in the past. She is on ARB - denies side effects. She has been eating healthier  HTN: she has been taking her medications as prescribed and denies side effects. Denies orthostatic changes, chest pain or palpitation.  Hyperlipidemia: Taking Lipitor and denies side effects. No myalgia. Recheck labs today   Obesity: She has been using her stationary bike a couple times a week for about 20 minutes, also dancing a couple of times a week    Review of Systems  Constitutional: Negative for fever or weight change.  Respiratory: Negative for cough and shortness of breath.   Cardiovascular: Negative for chest pain or palpitations.  Gastrointestinal: Negative for abdominal pain, no bowel changes.  Musculoskeletal: Negative for gait problem or joint swelling.  Skin: Negative for rash.  Neurological: Negative for dizziness or headache.  No other specific complaints in a complete review of systems (except as listed in HPI above).  Past Medical History:  Diagnosis Date  . Arthritis    knees  . Diabetes mellitus without complication ([place])   . Hyperlipidemia   . Hypertension   . Obesity   . Patient is Jehovah's Witness    declines blood products  . Wears dentures    full upper    Past Surgical History:  Procedure  Laterality Date  . COLONOSCOPY WITH PROPOFOL N/A 11/30/2014   Procedure: COLONOSCOPY WITH PROPOFOL;  Surgeon: Lucilla Lame, MD;  Location: [place];  Service: Endoscopy;  Laterality: N/A;  WITH BIOPSY-- TRANSVERSE COLON POLYP  X 2 DESCENING COLON POLYP  . GALLBLADDER SURGERY    . SEPTOPLASTY    . tonsillectomy    . TUBAL LIGATION      Family History  Problem Relation Age of Onset  . Colon cancer Mother   . Lung cancer Father   . Hypertension Sister   . Hypertension Sister     Social History   Social History  . Marital status: Married    Spouse name: N/A  . Number of children: N/A  . Years of education: N/A   Occupational History  . Not on file.   Social History Main Topics  . Smoking status: Never Smoker  . Smokeless tobacco: Never Used  . Alcohol use No  . Drug use: No  . Sexual activity: Yes   Other Topics Concern  . Not on file   Social History Narrative   Retired from [employer] , custodian   Husband also retired, but is back to work.    She dances for physical activity, hanging with girlfriend   She has two grandchildren, one is in college    Outpatient Encounter Prescriptions as of 03/04/2017  Medication Sig Note  . acetaminophen (TYLENOL) 500 MG tablet Take by mouth. 11/14/2014: Received from: [facility]  .  amLODipine (NORVASC) 10 MG tablet Take 1 tablet (10 mg total) by mouth daily.   Marland Kitchen aspirin EC 81 MG tablet Take by mouth. 11/17/2014: Received from: [facility]  . atorvastatin (LIPITOR) 40 MG tablet Take 1 tablet (40 mg total) by mouth daily.   . Cholecalciferol (VITAMIN D3) 2000 UNITS capsule VITAMIN D3, 2000UNIT (Oral Capsule)  2 po qday for 0 days  Quantity: 60.00;  Refills: 0   Ordered :15-May-2010  Steele Sizer MD;  Started 05-October-2008 Active 11/14/2014: Received from: [facility]  . fluticasone (FLONASE) 50 MCG/ACT nasal spray Place into the nose. 11/14/2014: Received from:  [facility]  . glucose blood test strip  05/23/2015: Received from: [facility]  . loratadine (CLARITIN) 10 MG tablet Take 1 tablet (10 mg total) by mouth daily.   . metFORMIN (GLUCOPHAGE) 500 MG tablet Take 1 tablet (500 mg total) by mouth every evening.   Marland Kitchen telmisartan-hydrochlorothiazide (MICARDIS HCT) 80-25 MG tablet Take 1 tablet by mouth daily.    No facility-administered encounter medications on file as of 03/04/2017.     Allergies  Allergen Reactions  . Oxycodone Other (See Comments)  . Oxycodone Hcl     insomnia, agitation    Care Team Updated in EHR: Yes  Last Vision Exam: 2017  Wears corrective lenses: Yes Last Dental Exam: up to date - 6 months ago  Last Hearing Exam:  Wears Hearing Aids: No  Functional Ability / Safety Screening 1.  Was the timed Get Up and Go test shorter than 30 seconds?  yes 2.  Does the patient need help with the phone, transportation, shopping,      preparing meals, housework, laundry, medications, or managing money?  no 3.  Is the patient's home free of loose throw rugs in walkways, pet beds, electrical cords, etc?   no      Grab bars in the bathroom? No       Handrails on the stairs?   yes      Adequate lighting?   yes 4.  Has the patient noticed any hearing difficulties?   no  Diet Recall and Exercise Regimen:  Current Exercise Habits: Home exercise routine, Type of exercise: Other - see comments;walking (dance), Time (Minutes): 15, Frequency (Times/Week): 3, Weekly Exercise (Minutes/Week): 45, Intensity: Mild Exercise limited by: None identified  Eating more at home, avoiding fast food and junk food  Advanced Care Planning:she states she has it at home Does patient have a HCPOA?    no If yes, name and contact information:  Does patient have a living will or MOST form?  no  Cancer Screenings:  Lung:Low Dose CT Chest recommended if Age 51-80 years, 30 pack-year currently smoking OR have quit w/in  15years. Patient does not qualify. Breast:Up to date on Mammogram? Yes  Up to date of Bone Density/Dexa? Yes Colon: up do date 2017   Additional Screenings:   Hepatitis C Screening: is up to date Intimate Partner Violence: denies  Denies bladder symptoms   Objective:   Vitals: BP 130/70 (BP Location: Right Arm, Patient Position: Sitting, Cuff Size: Normal)   Pulse 97   Temp 98.2 F (36.8 C) (Oral)   Resp 14   Ht 5\' 1"  (1.549 m)   Wt 210 lb 12.8 oz (95.6 kg)   SpO2 97%   BMI 39.83 kg/m  Body mass index is 39.83 kg/m.  No exam data present  Physical Exam Constitutional: Patient appears well-developed and well-nourished. Obese  No distress.  HEENT: head atraumatic, normocephalic, pupils equal and reactive to light, neck supple, throat within normal limits Cardiovascular: Normal rate, regular rhythm and normal heart sounds.  No murmur heard. No BLE edema. Pulmonary/Chest: Effort normal and breath sounds normal. No respiratory distress. Abdominal: Soft.  There is no tenderness. Psychiatric: Patient has a normal mood and affect. behavior is normal. Judgment and thought content normal.  Cognitive Testing - 6-CIT  Correct? Score   What year is it? yes 0 Yes = 0    No = 4  What month is it? yes 0 Yes = 0    No = 3  Remember:     [address]ia [address], [place], [place]     What time is it? yes 0 Yes = 0    No = 3  Count backwards from 20 to 1 yes 0 Correct = 0    1 error = 2   More than 1 error = 4  Say the months of the year in reverse. yes 0 Correct = 0    1 error = 2   More than 1 error = 4  What address did I ask you to remember? yes 0 Correct = 0  1 error = 2    2 error = 4    3 error = 6    4 error = 8    All wrong = 10       TOTAL SCORE  0/28   Interpretation:  Normal  Normal (0-7) Abnormal (8-28)   Fall Risk: Fall Risk  03/04/2017 12/04/2016 05/27/2016 01/22/2016 09/20/2015  Falls in the past year? No No No No No    Depression Screen Depression screen [facility] 2/9 03/04/2017  12/04/2016 05/27/2016 01/22/2016 09/20/2015  Decreased Interest 0 0 0 0 0  Down, Depressed, Hopeless 0 0 0 0 0  PHQ - 2 Score 0 0 0 0 0    No results found for this or any previous visit (from the past 2160 hour(s)).  Assessment & Plan:    1. Medicare annual wellness visit, subsequent  Discussed importance of 150 minutes of physical activity weekly, eat two servings of fish weekly, eat one serving of tree nuts ( cashews, pistachios, pecans, almonds.Marland Kitchen) every other day, eat 6 servings of fruit/vegetables daily and drink plenty of water and avoid sweet beverages.   2. Controlled type 2 diabetes mellitus with stage 2 chronic kidney disease, without long-term current use of insulin (HCC)  - Hemoglobin A1c  3. Dyslipidemia  - Lipid panel  4. Morbid obesity, unspecified obesity type [facility])  Discussed with the patient the risk posed by an increased BMI. Discussed importance of portion control, calorie counting and at least 150 minutes of physical activity weekly. Avoid sweet beverages and drink more water. Eat at least 6 servings of fruit and vegetables daily   5. Hypercalcemia  Recheck leve  6. Encounter for screening for cervical cancer   - CBC with Differential/Platelet - COMPLETE METABOLIC PANEL WITH GFR  7. Long-term use of high-risk medication  - CBC with Differential/Platelet - COMPLETE METABOLIC PANEL WITH GFR  8. Vitamin D deficiency  - VITAMIN D 25 Hydroxy (Vit-D Deficiency, Fractures)  9. Hematuria, unspecified type  - Urine Microalbumin w/creat. ratio - Urinalysis, Routine w reflex microscopic   - Discussed health benefits of physical activity, and encouraged her to engage in regular exercise appropriate for her age and condition.   Immunization History  Administered Date(s) Administered  . Influenza Split  02/23/2008  . Influenza-Unspecified 01/25/2014, 01/23/2015, 01/30/2016, 02/06/2017  . Pneumococcal Conjugate-13 01/17/2015  . Pneumococcal Polysaccharide-23  06/26/2010, 02/20/2016  . Tdap 06/26/2010  . Zoster 03/06/2011    Health Maintenance  Topic Date Due  . HEMOGLOBIN A1C  06/06/2017  . OPHTHALMOLOGY EXAM  08/27/2017  . FOOT EXAM  12/04/2017  . MAMMOGRAM  12/20/2018  . COLONOSCOPY  11/30/2019  . TETANUS/TDAP  06/26/2020  . INFLUENZA VACCINE  Completed  . DEXA SCAN  Completed  . Hepatitis C Screening  Completed  . PNA vac Low Risk Adult  Completed    No orders of the defined types were placed in this encounter.   Current Outpatient Prescriptions:  .  acetaminophen (TYLENOL) 500 MG tablet, Take by mouth., Disp: , Rfl:  .  amLODipine (NORVASC) 10 MG tablet, Take 1 tablet (10 mg total) by mouth daily., Disp: 90 tablet, Rfl: 1 .  aspirin EC 81 MG tablet, Take by mouth., Disp: , Rfl:  .  atorvastatin (LIPITOR) 40 MG tablet, Take 1 tablet (40 mg total) by mouth daily., Disp: 90 tablet, Rfl: 1 .  Cholecalciferol (VITAMIN D3) 2000 UNITS capsule, VITAMIN D3, 2000UNIT (Oral Capsule)  2 po qday for 0 days  Quantity: 60.00;  Refills: 0   Ordered :15-May-2010  Steele Sizer MD;  Started 05-October-2008 Active, Disp: , Rfl:  .  fluticasone (FLONASE) 50 MCG/ACT nasal spray, Place into the nose., Disp: , Rfl:  .  glucose blood test strip, , Disp: , Rfl:  .  loratadine (CLARITIN) 10 MG tablet, Take 1 tablet (10 mg total) by mouth daily., Disp: 30 tablet, Rfl: 0 .  metFORMIN (GLUCOPHAGE) 500 MG tablet, Take 1 tablet (500 mg total) by mouth every evening., Disp: 90 tablet, Rfl: 1 .  telmisartan-hydrochlorothiazide (MICARDIS HCT) 80-25 MG tablet, Take 1 tablet by mouth daily., Disp: 90 tablet, Rfl: 1 There are no discontinued medications.  I have personally reviewed and addressed the Medicare Annual Wellness health risk assessment questionnaire and have noted the following in the patient's chart:  A.         Medical and social history & family history B.         Use of alcohol, tobacco, and illicit drugs  C.         Current medications and  supplements D.         Functional and Cognitive ability and status E.         Nutritional status F.         Physical activity G.        Advance directives H.         List of other physicians I.          Hospitalizations, surgeries, and ER visits in previous 12 months J.         [place] such as hearing, vision, cognitive function, and depression L.         Referrals and appointments:  In addition, I have reviewed and discussed with patient certain preventive protocols, quality metrics, and best practice recommendations. A written personalized care plan for preventive services as well as general preventive health recommendations were provided to patient.  See attached scanned questionnaire for additional information.   Needs to return for pap smear, last one was [facility]

## 2017-03-05 LAB — COMPLETE METABOLIC PANEL WITH GFR
AG Ratio: 1.5 (calc) (ref 1.0–2.5)
ALBUMIN MSPROF: 4.5 g/dL (ref 3.6–5.1)
ALT: 18 U/L (ref 6–29)
AST: 19 U/L (ref 10–35)
Alkaline phosphatase (APISO): 80 U/L (ref 33–130)
BUN / CREAT RATIO: 13 (calc) (ref 6–22)
BUN: 13 mg/dL (ref 7–25)
CO2: 28 mmol/L (ref 20–32)
CREATININE: 1 mg/dL — AB (ref 0.50–0.99)
Calcium: 10.3 mg/dL (ref 8.6–10.4)
Chloride: 99 mmol/L (ref 98–110)
GFR, EST AFRICAN AMERICAN: 68 mL/min/{1.73_m2} (ref >=60)
GFR, EST NON AFRICAN AMERICAN: 58 mL/min/{1.73_m2} — AB (ref >=60)
GLOBULIN: 3 g/dL (ref 1.9–3.7)
Glucose, Bld: 91 mg/dL (ref 65–99)
Potassium: 3.7 mmol/L (ref 3.5–5.3)
SODIUM: 137 mmol/L (ref 135–146)
TOTAL PROTEIN: 7.5 g/dL (ref 6.1–8.1)
Total Bilirubin: 0.6 mg/dL (ref 0.2–1.2)

## 2017-03-05 LAB — URINALYSIS, ROUTINE W REFLEX MICROSCOPIC
Bilirubin Urine: NEGATIVE
Glucose, UA: NEGATIVE
HGB URINE DIPSTICK: NEGATIVE
KETONES UR: NEGATIVE
Leukocytes, UA: NEGATIVE
NITRITE: NEGATIVE
PH: 6 (ref 5.0–8.0)
PROTEIN: NEGATIVE
Specific Gravity, Urine: 1.005 (ref 1.001–1.03)

## 2017-03-05 LAB — CBC WITH DIFFERENTIAL/PLATELET
BASOS PCT: 0.7 %
Basophils Absolute: 57 cells/uL (ref 0–200)
EOS PCT: 2.4 %
Eosinophils Absolute: 194 cells/uL (ref 15–500)
HCT: 40.9 % (ref 35.0–45.0)
Hemoglobin: 13.5 g/dL (ref 11.7–15.5)
LYMPHS ABS: 1790 {cells}/uL (ref 850–3900)
MCH: 27.2 pg (ref 27.0–33.0)
MCHC: 33 g/dL (ref 32.0–36.0)
MCV: 82.5 fL (ref 80.0–100.0)
MPV: 11.1 fL (ref 7.5–12.5)
Monocytes Relative: 6.1 %
Neutro Abs: 5565 cells/uL (ref 1500–7800)
Neutrophils Relative %: 68.7 %
PLATELETS: 364 10*3/uL (ref 140–400)
RBC: 4.96 10*6/uL (ref 3.80–5.10)
RDW: 14.8 % (ref 11.0–15.0)
TOTAL LYMPHOCYTE: 22.1 %
WBC: 8.1 10*3/uL (ref 3.8–10.8)
WBCMIX: 494 {cells}/uL (ref 200–950)

## 2017-03-05 LAB — HEMOGLOBIN A1C
EAG (MMOL/L): 7.3 (calc)
HEMOGLOBIN A1C: 6.2 %{Hb} — AB (ref <5.7)
Mean Plasma Glucose: 131 (calc)

## 2017-03-05 LAB — LIPID PANEL
Cholesterol: 127 mg/dL (ref <200)
HDL: 44 mg/dL — AB (ref >50)
LDL Cholesterol (Calc): 62 mg/dL (calc)
Non-HDL Cholesterol (Calc): 83 mg/dL (calc) (ref <130)
TRIGLYCERIDES: 124 mg/dL (ref <150)
Total CHOL/HDL Ratio: 2.9 (calc) (ref <5.0)

## 2017-03-05 LAB — MICROALBUMIN / CREATININE URINE RATIO
CREATININE, URINE: 47 mg/dL (ref 20–275)
MICROALB UR: 0.2 mg/dL
MICROALB/CREAT RATIO: 4 ug/mg{creat} (ref <30)

## 2017-03-05 LAB — VITAMIN D 25 HYDROXY (VIT D DEFICIENCY, FRACTURES): VIT D 25 HYDROXY: 39 ng/mL (ref 30–100)

## 2017-03-19 ENCOUNTER — Ambulatory Visit (INDEPENDENT_AMBULATORY_CARE_PROVIDER_SITE_OTHER): Payer: Medicare Other | Admitting: Family Medicine

## 2017-03-19 ENCOUNTER — Encounter: Payer: Self-pay | Admitting: Family Medicine

## 2017-03-19 ENCOUNTER — Other Ambulatory Visit: Payer: Self-pay | Admitting: Family Medicine

## 2017-03-19 VITALS — BP 130/76 | HR 100 | Temp 97.8°F | Resp 16 | Ht 61.0 in | Wt 208.8 lb

## 2017-03-19 DIAGNOSIS — N3001 Acute cystitis with hematuria: Secondary | ICD-10-CM | POA: Diagnosis not present

## 2017-03-19 DIAGNOSIS — R319 Hematuria, unspecified: Secondary | ICD-10-CM

## 2017-03-19 LAB — POCT URINALYSIS DIPSTICK
BILIRUBIN UA: NEGATIVE
GLUCOSE UA: NEGATIVE
Ketones, UA: NEGATIVE
NITRITE UA: NEGATIVE
PH UA: 6 (ref 5.0–8.0)
Protein, UA: 30
Spec Grav, UA: 1.01 (ref 1.010–1.025)
Urobilinogen, UA: 0.2 E.U./dL

## 2017-03-19 MED ORDER — SULFAMETHOXAZOLE-TRIMETHOPRIM 800-160 MG PO TABS: 1.0000 | ORAL_TABLET | Freq: Two times a day (BID) | ORAL | 0 refills | Status: AC

## 2017-03-19 NOTE — Progress Notes (Signed)
Name: Yvonne Curtis   MRN: [id number]    DOB: 1950/05/27   Date:03/19/2017       Progress Note  Subjective  Chief Complaint  Chief Complaint  Patient presents with  . Urinary Tract Infection    burning when urinating for 2 days    HPI  Patient presents with 3 day history of dysuria - progressively worsening.  She noticed today that she had small amount of blood in her urine.  Denies abdominal pain, NVD, flank/back pain, history of kidney stones, fevers/chills. She has been drinking plenty of fluids.  Patient is diabetic, taking metformin only. Kidney function on 03/04/2017 was WNL.  Patient Active Problem List   Diagnosis Date Noted  . Hematuria 02/17/2015  . Acanthosis nigricans 11/14/2014  . Benign hypertension 11/14/2014  . Dyslipidemia 11/14/2014  . Gastro-esophageal reflux disease without esophagitis 11/14/2014  . Extreme obesity 11/14/2014  . Primary osteoarthritis of both knees 11/14/2014  . Microalbuminuria 10/15/2012  . Diabetes mellitus with renal manifestations, controlled ([place]) 06/05/2009  . Body dermatophytosis 12/13/2008    Social History   Tobacco Use  . Smoking status: Never Smoker  . Smokeless tobacco: Never Used  Substance Use Topics  . Alcohol use: No    Alcohol/week: 0.0 oz     Current Outpatient Medications:  .  acetaminophen (TYLENOL) 500 MG tablet, Take by mouth., Disp: , Rfl:  .  amLODipine (NORVASC) 10 MG tablet, Take 1 tablet (10 mg total) by mouth daily., Disp: 90 tablet, Rfl: 1 .  aspirin EC 81 MG tablet, Take by mouth., Disp: , Rfl:  .  atorvastatin (LIPITOR) 40 MG tablet, Take 1 tablet (40 mg total) by mouth daily., Disp: 90 tablet, Rfl: 1 .  Cholecalciferol (VITAMIN D3) 2000 UNITS capsule, VITAMIN D3, 2000UNIT (Oral Capsule)  2 po qday for 0 days  Quantity: 60.00;  Refills: 0   Ordered :15-May-2010  Steele Sizer MD;  Started 05-October-2008 Active, Disp: , Rfl:  .  fluticasone (FLONASE) 50 MCG/ACT nasal spray, Place into the nose.,  Disp: , Rfl:  .  glucose blood test strip, , Disp: , Rfl:  .  loratadine (CLARITIN) 10 MG tablet, Take 1 tablet (10 mg total) by mouth daily., Disp: 30 tablet, Rfl: 0 .  metFORMIN (GLUCOPHAGE) 500 MG tablet, Take 1 tablet (500 mg total) by mouth every evening., Disp: 90 tablet, Rfl: 1 .  telmisartan-hydrochlorothiazide (MICARDIS HCT) 80-25 MG tablet, Take 1 tablet by mouth daily., Disp: 90 tablet, Rfl: 1  Allergies  Allergen Reactions  . Oxycodone Other (See Comments)  . Oxycodone Hcl     insomnia, agitation    ROS  Constitutional: Negative for fever or weight change.  Respiratory: Negative for cough and shortness of breath.   Cardiovascular: Negative for chest pain or palpitations.  Gastrointestinal: Negative for abdominal pain, no bowel changes.  GU: See HPI Musculoskeletal: Negative for gait problem or joint swelling.  Skin: Negative for rash.  Neurological: Negative for dizziness or headache.  No other specific complaints in a complete review of systems (except as listed in HPI above).  Objective  Vitals:   03/19/17 1309  BP: 130/76  Pulse: 100  Resp: 16  Temp: 97.8 F (36.6 C)  TempSrc: Oral  SpO2: 97%  Weight: 208 lb 12.8 oz (94.7 kg)  Height: 5\' 1"  (1.549 m)    Body mass index is 39.45 kg/m.  Nursing Note and Vital Signs reviewed.  Physical Exam  Constitutional: Patient appears well-developed and well-nourished. Obese. No distress.  HEENT: head atraumatic, normocephalic Cardiovascular: Normal rate, regular rhythm, S1/S2 present.  No murmur or rub heard. No BLE edema. Pulmonary/Chest: Effort normal and breath sounds clear. No respiratory distress or retractions. Abdominal: Soft and non-tender, bowel sounds present x4 quadrants.  No CVA Tenderness Psychiatric: Patient has a normal mood and affect. behavior is normal. Judgment and thought content normal.  Recent Results (from the past 2160 hour(s))  CBC with Differential/Platelet     Status: None    Collection Time: 03/04/17 11:55 AM  Result Value Ref Range   WBC 8.1 3.8 - 10.8 Thousand/uL   RBC 4.96 3.80 - 5.10 Million/uL   Hemoglobin 13.5 11.7 - 15.5 g/dL   HCT 40.9 35.0 - 45.0 %   MCV 82.5 80.0 - 100.0 fL   MCH 27.2 27.0 - 33.0 pg   MCHC 33.0 32.0 - 36.0 g/dL   RDW 14.8 11.0 - 15.0 %   Platelets 364 140 - 400 Thousand/uL   MPV 11.1 7.5 - 12.5 fL   Neutro Abs 5,565 1,500 - 7,800 cells/uL   Lymphs Abs 1,790 850 - 3,900 cells/uL   WBC mixed population 494 200 - 950 cells/uL   Eosinophils Absolute 194 15 - 500 cells/uL   Basophils Absolute 57 0 - 200 cells/uL   Neutrophils Relative % 68.7 %   Total Lymphocyte 22.1 %   Monocytes Relative 6.1 %   Eosinophils Relative 2.4 %   Basophils Relative 0.7 %  COMPLETE METABOLIC PANEL WITH GFR     Status: Abnormal   Collection Time: 03/04/17 11:55 AM  Result Value Ref Range   Glucose, Bld 91 65 - 99 mg/dL    Comment: .            Fasting reference interval .    BUN 13 7 - 25 mg/dL   Creat 1.00 (H) 0.50 - 0.99 mg/dL    Comment: For patients >72 years of age, the reference limit for Creatinine is approximately 13% higher for people identified as African-American. .    GFR, Est Non African American 58 (L) > OR = 60 mL/min/1.20m2   GFR, Est African American 68 > OR = 60 mL/min/1.60m2   BUN/Creatinine Ratio 13 6 - 22 (calc)   Sodium 137 135 - 146 mmol/L   Potassium 3.7 3.5 - 5.3 mmol/L   Chloride 99 98 - 110 mmol/L   CO2 28 20 - 32 mmol/L   Calcium 10.3 8.6 - 10.4 mg/dL   Total Protein 7.5 6.1 - 8.1 g/dL   Albumin 4.5 3.6 - 5.1 g/dL   Globulin 3.0 1.9 - 3.7 g/dL (calc)   AG Ratio 1.5 1.0 - 2.5 (calc)   Total Bilirubin 0.6 0.2 - 1.2 mg/dL   Alkaline phosphatase (APISO) 80 33 - 130 U/L   AST 19 10 - 35 U/L   ALT 18 6 - 29 U/L  Hemoglobin A1c     Status: Abnormal   Collection Time: 03/04/17 11:55 AM  Result Value Ref Range   Hgb A1c MFr Bld 6.2 (H) <5.7 % of total Hgb    Comment: For someone without known diabetes, a  hemoglobin  A1c value between 5.7% and 6.4% is consistent with prediabetes and should be confirmed with a  follow-up test. . For someone with known diabetes, a value <7% indicates that their diabetes is well controlled. A1c targets should be individualized based on duration of diabetes, age, comorbid conditions, and other considerations. . This assay result is consistent with an increased risk of  diabetes. . Currently, no consensus exists regarding use of hemoglobin A1c for diagnosis of diabetes for children. .    Mean Plasma Glucose 131 (calc)   eAG (mmol/L) 7.3 (calc)  VITAMIN D 25 Hydroxy (Vit-D Deficiency, Fractures)     Status: None   Collection Time: 03/04/17 11:55 AM  Result Value Ref Range   Vit D, 25-Hydroxy 39 30 - 100 ng/mL    Comment: Vitamin D Status         25-OH Vitamin D: . Deficiency:                    <20 ng/mL Insufficiency:             20 - 29 ng/mL Optimal:                 > or = 30 ng/mL . For 25-OH Vitamin D testing on patients on  D2-supplementation and patients for whom quantitation  of D2 and D3 fractions is required, the QuestAssureD(TM) 25-OH VIT D, (D2,D3), LC/MS/MS is recommended: order  code [phone] (patients >14yrs). . For more information on this test, go to: http://education.[facility].com/faq/FAQ163 (This link is being provided for  informational/educational purposes only.)   Lipid panel     Status: Abnormal   Collection Time: 03/04/17 11:55 AM  Result Value Ref Range   Cholesterol 127 <200 mg/dL   HDL 44 (L) >50 mg/dL   Triglycerides 124 <150 mg/dL   LDL Cholesterol (Calc) 62 mg/dL (calc)    Comment: Reference range: <100 . Desirable range <100 mg/dL for primary prevention;   <70 mg/dL for patients with CHD or diabetic patients  with > or = 2 CHD risk factors. Marland Kitchen LDL-C is now calculated using the Martin-Hopkins  calculation, which is a validated novel method providing  better accuracy than the Friedewald equation in the   estimation of LDL-C.  Cresenciano Genre et al. Annamaria Helling. 9371;696(78): 2061-2068  (http://education.[facility].com/faq/FAQ164)    Total CHOL/HDL Ratio 2.9 <5.0 (calc)   Non-HDL Cholesterol (Calc) 83 <130 mg/dL (calc)    Comment: For patients with diabetes plus 1 major ASCVD risk  factor, treating to a non-HDL-C goal of <100 mg/dL  (LDL-C of <70 mg/dL) is considered a therapeutic  option.   Urine Microalbumin w/creat. ratio     Status: None   Collection Time: 03/04/17 11:55 AM  Result Value Ref Range   Creatinine, Urine 47 20 - 275 mg/dL   Microalb, Ur 0.2 mg/dL    Comment: Reference Range Not established    Microalb Creat Ratio 4 <30 mcg/mg creat    Comment: . The ADA defines abnormalities in albumin excretion as follows: Marland Kitchen Category         Result (mcg/mg creatinine) . Normal                    <30 Microalbuminuria         30-299  Clinical albuminuria   > OR = 300 . The ADA recommends that at least two of three specimens collected within a 3-6 month period be abnormal before considering a patient to be within a diagnostic category.   Urinalysis, Routine w reflex microscopic     Status: None   Collection Time: 03/04/17 11:55 AM  Result Value Ref Range   Color, Urine YELLOW YELLOW   APPearance CLEAR CLEAR   Specific Gravity, Urine 1.005 1.001 - 1.03   pH 6.0 5.0 - 8.0   Glucose, UA NEGATIVE NEGATIVE   Bilirubin Urine  NEGATIVE NEGATIVE   Ketones, ur NEGATIVE NEGATIVE   Hgb urine dipstick NEGATIVE NEGATIVE   Protein, ur NEGATIVE NEGATIVE   Nitrite NEGATIVE NEGATIVE   Leukocytes, UA NEGATIVE NEGATIVE  POCT urinalysis dipstick     Status: Abnormal   Collection Time: 03/19/17  1:12 PM  Result Value Ref Range   Color, UA yellow    Clarity, UA clear    Glucose, UA negative    Bilirubin, UA negative    Ketones, UA negative    Spec Grav, UA 1.010 1.010 - 1.025   Blood, UA large    pH, UA 6.0 5.0 - 8.0   Protein, UA 30    Urobilinogen, UA 0.2 0.2 or 1.0 E.U./dL    Nitrite, UA negative    Leukocytes, UA Large (3+) (A) Negative     Assessment & Plan  1. Acute cystitis with hematuria - POCT urinalysis dipstick - sulfamethoxazole-trimethoprim (BACTRIM DS,SEPTRA DS) 800-160 MG tablet; Take 1 tablet 2 (two) times daily for 3 days by mouth.  Dispense: 6 tablet; Refill: 0 - Urine Culture - Drink plenty of water, void after exercise and intercourse. - Recheck urine in 2-3 weeks to ensure resolution of hematuria.  -Red flags and when to present for emergency care or RTC including fever >101.19F, chest pain, shortness of breath, new/worsening/un-resolving symptoms, abdominal/flank/back pain, nausea/vomiting, reviewed with patient at time of visit. Follow up and care instructions discussed and provided in AVS.

## 2017-03-19 NOTE — Patient Instructions (Addendum)

## 2017-03-20 LAB — URINE CULTURE
MICRO NUMBER: 81290983
SPECIMEN QUALITY:: ADEQUATE

## 2017-04-02 ENCOUNTER — Ambulatory Visit (INDEPENDENT_AMBULATORY_CARE_PROVIDER_SITE_OTHER): Payer: Medicare Other

## 2017-04-02 DIAGNOSIS — R319 Hematuria, unspecified: Secondary | ICD-10-CM

## 2017-04-02 LAB — POCT URINALYSIS DIPSTICK
BILIRUBIN UA: NEGATIVE
Blood, UA: NEGATIVE
Glucose, UA: NEGATIVE
KETONES UA: NEGATIVE
Leukocytes, UA: NEGATIVE
Nitrite, UA: NEGATIVE
SPEC GRAV UA: 1.025 (ref 1.010–1.025)
Urobilinogen, UA: 0.2 E.U./dL
pH, UA: 6 (ref 5.0–8.0)

## 2017-04-02 NOTE — Progress Notes (Signed)
Results was given to Raelyn Ensign, [place].

## 2017-04-22 ENCOUNTER — Encounter: Payer: Self-pay | Admitting: Family Medicine

## 2017-04-22 ENCOUNTER — Ambulatory Visit (INDEPENDENT_AMBULATORY_CARE_PROVIDER_SITE_OTHER): Payer: Medicare Other | Admitting: Family Medicine

## 2017-04-22 VITALS — BP 114/76 | HR 87 | Temp 97.4°F | Resp 14 | Ht 60.63 in | Wt 210.5 lb

## 2017-04-22 DIAGNOSIS — R87610 Atypical squamous cells of undetermined significance on cytologic smear of cervix (ASC-US): Secondary | ICD-10-CM

## 2017-04-22 DIAGNOSIS — E2839 Other primary ovarian failure: Secondary | ICD-10-CM

## 2017-04-22 DIAGNOSIS — Z0000 Encounter for general adult medical examination without abnormal findings: Secondary | ICD-10-CM | POA: Diagnosis not present

## 2017-04-22 NOTE — Progress Notes (Signed)
Name: Yvonne Curtis   MRN: [id number]    DOB: 1950/08/04   Date:04/22/2017       Progress Note  Subjective  Chief Complaint  Chief Complaint  Patient presents with  . Annual Exam    HPI  She has a medicare wellness exam two months ago and is here today for the physical exam.  She is post-menopausal., married, and no previous history of colposcopy or LEEP. She is sexually active, no pain during intercourse, no post-menopausal bleeding and no bladder problems. Mammogram is up to date.    Patient Active Problem List   Diagnosis Date Noted  . Hematuria 02/17/2015  . Acanthosis nigricans 11/14/2014  . Benign hypertension 11/14/2014  . Dyslipidemia 11/14/2014  . Gastro-esophageal reflux disease without esophagitis 11/14/2014  . Extreme obesity 11/14/2014  . Primary osteoarthritis of both knees 11/14/2014  . Microalbuminuria 10/15/2012  . Diabetes mellitus with renal manifestations, controlled ([place]) 06/05/2009  . Body dermatophytosis 12/13/2008    Past Surgical History:  Procedure Laterality Date  . COLONOSCOPY WITH PROPOFOL N/A 11/30/2014   Procedure: COLONOSCOPY WITH PROPOFOL;  Surgeon: Lucilla Lame, MD;  Location: [facility];  Service: Endoscopy;  Laterality: N/A;  WITH BIOPSY-- TRANSVERSE COLON POLYP  X 2 DESCENING COLON POLYP  . GALLBLADDER SURGERY    . SEPTOPLASTY    . tonsillectomy    . TUBAL LIGATION      Family History  Problem Relation Age of Onset  . Colon cancer Mother   . Lung cancer Father   . Hypertension Sister   . Hypertension Sister     Social History   Socioeconomic History  . Marital status: Married    Spouse name: Not on file  . Number of children: Not on file  . Years of education: Not on file  . Highest education level: Not on file  Social Needs  . Financial resource strain: Not on file  . Food insecurity - worry: Not on file  . Food insecurity - inability: Not on file  . Transportation needs - medical: Not on file  .  Transportation needs - non-medical: Not on file  Occupational History  . Not on file  Tobacco Use  . Smoking status: Never Smoker  . Smokeless tobacco: Never Used  Substance and Sexual Activity  . Alcohol use: No    Alcohol/week: 0.0 oz  . Drug use: No  . Sexual activity: Yes  Other Topics Concern  . 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    She dances for physical activity, hanging with girlfriend   She has two grandchildren, one is in college     Current Outpatient Medications:  .  acetaminophen (TYLENOL) 500 MG tablet, Take by mouth., Disp: , Rfl:  .  amLODipine (NORVASC) 10 MG tablet, Take 1 tablet (10 mg total) by mouth daily., Disp: 90 tablet, Rfl: 1 .  aspirin EC 81 MG tablet, Take by mouth., Disp: , Rfl:  .  atorvastatin (LIPITOR) 40 MG tablet, Take 1 tablet (40 mg total) by mouth daily., Disp: 90 tablet, Rfl: 1 .  Cholecalciferol (VITAMIN D3) 2000 UNITS capsule, VITAMIN D3, 2000UNIT (Oral Capsule)  2 po qday for 0 days  Quantity: 60.00;  Refills: 0   Ordered :15-May-2010  Steele Sizer MD;  Buddy Duty 05-October-2008 Active, Disp: , Rfl:  .  fluticasone (FLONASE) 50 MCG/ACT nasal spray, Place into the nose., Disp: ,  Rfl:  .  glucose blood test strip, , Disp: , Rfl:  .  loratadine (CLARITIN) 10 MG tablet, Take 1 tablet (10 mg total) by mouth daily., Disp: 30 tablet, Rfl: 0 .  metFORMIN (GLUCOPHAGE) 500 MG tablet, Take 1 tablet (500 mg total) by mouth every evening., Disp: 90 tablet, Rfl: 1 .  telmisartan-hydrochlorothiazide (MICARDIS HCT) 80-25 MG tablet, Take 1 tablet by mouth daily., Disp: 90 tablet, Rfl: 1  Allergies  Allergen Reactions  . Oxycodone Other (See Comments)  . Oxycodone Hcl     insomnia, agitation     ROS  Constitutional: Negative for fever or weight change.  Respiratory: Negative for cough and shortness of breath.   Cardiovascular: Negative for chest pain or  palpitations.  Gastrointestinal: Negative for abdominal pain, no bowel changes.  Musculoskeletal: Negative for gait problem or joint swelling.  Skin: Negative for rash.  Neurological: Negative for dizziness or headache.  No other specific complaints in a complete review of systems (except as listed in HPI above).  Objective  Vitals:   04/22/17 1100  BP: 114/76  Pulse: 87  Resp: 14  Temp: (!) 97.4 F (36.3 C)  TempSrc: Oral  SpO2: 98%  Weight: 210 lb 8 oz (95.5 kg)  Height: 5' 0.63" (1.54 m)    Body mass index is 40.26 kg/m.  Physical Exam  Constitutional: Patient appears well-developed and obese.  No distress.  HENT: Head: Normocephalic and atraumatic. Ears: B TMs ok, no erythema or effusion; Nose: Nose normal. Mouth/Throat: Oropharynx is clear and moist. No oropharyngeal exudate.  Eyes: Conjunctivae and EOM are normal. Pupils are equal, round, and reactive to light. No scleral icterus.  Neck: Normal range of motion. Neck supple. No JVD present. No thyromegaly present.  Cardiovascular: Normal rate, regular rhythm and normal heart sounds.  No murmur heard. No BLE edema. Pulmonary/Chest: Effort normal and breath sounds normal. No respiratory distress. Abdominal: Soft. Bowel sounds are normal, no distension. There is no tenderness. no masses Breast: no lumps or masses, no nipple discharge or rashes FEMALE GENITALIA:  External genitalia normal External urethra normal Vaginal vault normal without discharge or lesions Cervix normal without discharge or lesions Bimanual exam normal without masses RECTAL: not done Musculoskeletal: Normal range of motion, no joint effusions. No gross deformities Neurological: he is alert and oriented to person, place, and time. No cranial nerve deficit. Coordination, balance, strength, speech and gait are normal.  Skin: Skin is warm and dry. She has skin tag and acanthosis nigricans Psychiatric: Patient has a normal mood and affect. behavior is  normal. Judgment and thought content normal.  Recent Results (from the past 2160 hour(s))  CBC with Differential/Platelet     Status: None   Collection Time: 03/04/17 11:55 AM  Result Value Ref Range   WBC 8.1 3.8 - 10.8 Thousand/uL   RBC 4.96 3.80 - 5.10 Million/uL   Hemoglobin 13.5 11.7 - 15.5 g/dL   HCT 40.9 35.0 - 45.0 %   MCV 82.5 80.0 - 100.0 fL   MCH 27.2 27.0 - 33.0 pg   MCHC 33.0 32.0 - 36.0 g/dL   RDW 14.8 11.0 - 15.0 %   Platelets 364 140 - 400 Thousand/uL   MPV 11.1 7.5 - 12.5 fL   Neutro Abs 5,565 1,500 - 7,800 cells/uL   Lymphs Abs 1,790 850 - 3,900 cells/uL   WBC mixed population 494 200 - 950 cells/uL   Eosinophils Absolute 194 15 - 500 cells/uL   Basophils Absolute 57 0 - 200  cells/uL   Neutrophils Relative % 68.7 %   Total Lymphocyte 22.1 %   Monocytes Relative 6.1 %   Eosinophils Relative 2.4 %   Basophils Relative 0.7 %  COMPLETE METABOLIC PANEL WITH GFR     Status: Abnormal   Collection Time: 03/04/17 11:55 AM  Result Value Ref Range   Glucose, Bld 91 65 - 99 mg/dL    Comment: .            Fasting reference interval .    BUN 13 7 - 25 mg/dL   Creat 1.00 (H) 0.50 - 0.99 mg/dL    Comment: For patients >39 years of age, the reference limit for Creatinine is approximately 13% higher for people identified as African-American. .    GFR, Est Non African American 58 (L) > OR = 60 mL/min/1.27m2   GFR, Est African American 68 > OR = 60 mL/min/1.50m2   BUN/Creatinine Ratio 13 6 - 22 (calc)   Sodium 137 135 - 146 mmol/L   Potassium 3.7 3.5 - 5.3 mmol/L   Chloride 99 98 - 110 mmol/L   CO2 28 20 - 32 mmol/L   Calcium 10.3 8.6 - 10.4 mg/dL   Total Protein 7.5 6.1 - 8.1 g/dL   Albumin 4.5 3.6 - 5.1 g/dL   Globulin 3.0 1.9 - 3.7 g/dL (calc)   AG Ratio 1.5 1.0 - 2.5 (calc)   Total Bilirubin 0.6 0.2 - 1.2 mg/dL   Alkaline phosphatase (APISO) 80 33 - 130 U/L   AST 19 10 - 35 U/L   ALT 18 6 - 29 U/L  Hemoglobin A1c     Status: Abnormal   Collection Time:  03/04/17 11:55 AM  Result Value Ref Range   Hgb A1c MFr Bld 6.2 (H) <5.7 % of total Hgb    Comment: For someone without known diabetes, a hemoglobin  A1c value between 5.7% and 6.4% is consistent with prediabetes and should be confirmed with a  follow-up test. . For someone with known diabetes, a value <7% indicates that their diabetes is well controlled. A1c targets should be individualized based on duration of diabetes, age, comorbid conditions, and other considerations. . This assay result is consistent with an increased risk of diabetes. . Currently, no consensus exists regarding use of hemoglobin A1c for diagnosis of diabetes for children. .    Mean Plasma Glucose 131 (calc)   eAG (mmol/L) 7.3 (calc)  VITAMIN D 25 Hydroxy (Vit-D Deficiency, Fractures)     Status: None   Collection Time: 03/04/17 11:55 AM  Result Value Ref Range   Vit D, 25-Hydroxy 39 30 - 100 ng/mL    Comment: Vitamin D Status         25-OH Vitamin D: . Deficiency:                    <20 ng/mL Insufficiency:             20 - 29 ng/mL Optimal:                 > or = 30 ng/mL . For 25-OH Vitamin D testing on patients on  D2-supplementation and patients for whom quantitation  of D2 and D3 fractions is required, the QuestAssureD(TM) 25-OH VIT D, (D2,D3), LC/MS/MS is recommended: order  code [phone] (patients >36yrs). . For more information on this test, go to: http://education.[facility].com/faq/FAQ163 (This link is being provided for  informational/educational purposes only.)   Lipid panel     Status: Abnormal  Collection Time: 03/04/17 11:55 AM  Result Value Ref Range   Cholesterol 127 <200 mg/dL   HDL 44 (L) >50 mg/dL   Triglycerides 124 <150 mg/dL   LDL Cholesterol (Calc) 62 mg/dL (calc)    Comment: Reference range: <100 . Desirable range <100 mg/dL for primary prevention;   <70 mg/dL for patients with CHD or diabetic patients  with > or = 2 CHD risk factors. Marland Kitchen LDL-C is now calculated  using the Martin-Hopkins  calculation, which is a validated novel method providing  better accuracy than the Friedewald equation in the  estimation of LDL-C.  Cresenciano Genre et al. Annamaria Helling. 5732;202(54): 2061-2068  (http://education.[facility].com/faq/FAQ164)    Total CHOL/HDL Ratio 2.9 <5.0 (calc)   Non-HDL Cholesterol (Calc) 83 <130 mg/dL (calc)    Comment: For patients with diabetes plus 1 major ASCVD risk  factor, treating to a non-HDL-C goal of <100 mg/dL  (LDL-C of <70 mg/dL) is considered a therapeutic  option.   Urine Microalbumin w/creat. ratio     Status: None   Collection Time: 03/04/17 11:55 AM  Result Value Ref Range   Creatinine, Urine 47 20 - 275 mg/dL   Microalb, Ur 0.2 mg/dL    Comment: Reference Range Not established    Microalb Creat Ratio 4 <30 mcg/mg creat    Comment: . The ADA defines abnormalities in albumin excretion as follows: Marland Kitchen Category         Result (mcg/mg creatinine) . Normal                    <30 Microalbuminuria         30-299  Clinical albuminuria   > OR = 300 . The ADA recommends that at least two of three specimens collected within a 3-6 month period be abnormal before considering a patient to be within a diagnostic category.   Urinalysis, Routine w reflex microscopic     Status: None   Collection Time: 03/04/17 11:55 AM  Result Value Ref Range   Color, Urine YELLOW YELLOW   APPearance CLEAR CLEAR   Specific Gravity, Urine 1.005 1.001 - 1.03   pH 6.0 5.0 - 8.0   Glucose, UA NEGATIVE NEGATIVE   Bilirubin Urine NEGATIVE NEGATIVE   Ketones, ur NEGATIVE NEGATIVE   Hgb urine dipstick NEGATIVE NEGATIVE   Protein, ur NEGATIVE NEGATIVE   Nitrite NEGATIVE NEGATIVE   Leukocytes, UA NEGATIVE NEGATIVE  POCT urinalysis dipstick     Status: Abnormal   Collection Time: 03/19/17  1:12 PM  Result Value Ref Range   Color, UA yellow    Clarity, UA clear    Glucose, UA negative    Bilirubin, UA negative    Ketones, UA negative    Spec Grav,  UA 1.010 1.010 - 1.025   Blood, UA large    pH, UA 6.0 5.0 - 8.0   Protein, UA 30    Urobilinogen, UA 0.2 0.2 or 1.0 E.U./dL   Nitrite, UA negative    Leukocytes, UA Large (3+) (A) Negative  Urine Culture     Status: None   Collection Time: 03/19/17  3:12 PM  Result Value Ref Range   MICRO NUMBER: [id number]    SPECIMEN QUALITY: ADEQUATE    Sample Source URINE    STATUS: FINAL    ISOLATE 1:      Three or more organisms present, each greater than 10,000 cu/mL. May represent normal flora contamination from external genitalia. No further testing is required.  POCT urinalysis dipstick  Status: Normal   Collection Time: 04/02/17  8:34 AM  Result Value Ref Range   Color, UA amber    Clarity, UA clear    Glucose, UA negative    Bilirubin, UA negative    Ketones, UA negative    Spec Grav, UA 1.025 1.010 - 1.025   Blood, UA negative    pH, UA 6.0 5.0 - 8.0   Protein, UA trace    Urobilinogen, UA 0.2 0.2 or 1.0 E.U./dL   Nitrite, UA negative    Leukocytes, UA Negative Negative      PHQ2/9: Depression screen [facility] 2/9 04/22/2017 03/04/2017 12/04/2016 05/27/2016 01/22/2016  Decreased Interest 0 0 0 0 0  Down, Depressed, Hopeless 0 0 0 0 0  PHQ - 2 Score 0 0 0 0 0     Fall Risk: Fall Risk  04/22/2017 03/04/2017 12/04/2016 05/27/2016 01/22/2016  Falls in the past year? No No No No No      Functional Status Survey: Is the patient deaf or have difficulty hearing?: No Does the patient have difficulty seeing, even when wearing glasses/contacts?: No Does the patient have difficulty concentrating, remembering, or making decisions?: No Does the patient have difficulty walking or climbing stairs?: No Does the patient have difficulty dressing or bathing?: No Does the patient have difficulty doing errands alone such as visiting a doctor's office or shopping?: No    Assessment & Plan  1. Well woman exam (no gynecological exam)  Discussed importance of 150 minutes of physical activity  weekly, eat two servings of fish weekly, eat one serving of tree nuts ( cashews, pistachios, pecans, almonds.Marland Kitchen) every other day, eat 6 servings of fruit/vegetables daily and drink plenty of water and avoid sweet beverages.   2. ASCUS of cervix with negative high risk HPV  - Pap IG and HPV (high risk) DNA detection  3. Ovarian failure  - DG Bone Density; Future

## 2017-04-22 NOTE — Progress Notes (Deleted)
Patient: Yvonne Curtis, Female    DOB: 03-31-1951, 66 y.o.   MRN: [id number]  Visit Date: 04/22/2017  Today's Provider: Loistine Chance, MD   Chief Complaint  Patient presents with  . Annual Exam    Subjective:    HPI Yvonne Curtis is a 66 y.o. female who presents today for her Subsequent Annual Wellness Visit.  Patient/Caregiver input:    Review of Systems *** Constitutional: Negative for fever or weight change.  Respiratory: Negative for cough and shortness of breath.   Cardiovascular: Negative for chest pain or palpitations.  Gastrointestinal: Negative for abdominal pain, no bowel changes.  Musculoskeletal: Negative for gait problem or joint swelling.  Skin: Negative for rash.  Neurological: Negative for dizziness or headache.  No other specific complaints in a complete review of systems (except as listed in HPI above).  Past Medical History:  Diagnosis Date  . Arthritis    knees  . Diabetes mellitus without complication ([place])   . Hyperlipidemia   . Hypertension   . Obesity   . Patient is Jehovah's Witness    declines blood products  . Wears dentures    full upper    Past Surgical History:  Procedure Laterality Date  . COLONOSCOPY WITH PROPOFOL N/A 11/30/2014   Procedure: COLONOSCOPY WITH PROPOFOL;  Surgeon: Lucilla Lame, MD;  Location: [facility];  Service: Endoscopy;  Laterality: N/A;  WITH BIOPSY-- TRANSVERSE COLON POLYP  X 2 DESCENING COLON POLYP  . GALLBLADDER SURGERY    . SEPTOPLASTY    . tonsillectomy    . TUBAL LIGATION      Family History  Problem Relation Age of Onset  . Colon cancer Mother   . Lung cancer Father   . Hypertension Sister   . Hypertension Sister     Social History   Socioeconomic History  . Marital status: Married    Spouse name: Not on file  . Number of children: Not on file  . Years of education: Not on file  . Highest education level: Not on file  Social Needs  . Financial resource strain: Not on  file  . Food insecurity - worry: Not on file  . Food insecurity - inability: Not on file  . Transportation needs - medical: Not on file  . Transportation needs - non-medical: Not on file  Occupational History  . Not on file  Tobacco Use  . Smoking status: Never Smoker  . Smokeless tobacco: Never Used  Substance and Sexual Activity  . Alcohol use: No    Alcohol/week: 0.0 oz  . Drug use: No  . Sexual activity: Yes  Other Topics Concern  . 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    She dances for physical activity, hanging with girlfriend   She has two grandchildren, one is in college    Outpatient Encounter Medications as of 04/22/2017  Medication Sig Note  . acetaminophen (TYLENOL) 500 MG tablet Take by mouth. 11/14/2014: Received from: [facility]  . amLODipine (NORVASC) 10 MG tablet Take 1 tablet (10 mg total) by mouth daily.   Marland Kitchen aspirin EC 81 MG tablet Take by mouth. 11/17/2014: Received from: [facility]  . atorvastatin (LIPITOR) 40 MG tablet Take 1 tablet (40 mg total) by mouth daily.   . Cholecalciferol (VITAMIN D3) 2000 UNITS capsule VITAMIN D3, 2000UNIT (Oral Capsule)  2 po qday for  0 days  Quantity: 60.00;  Refills: 0   Ordered :15-May-2010  Steele Sizer MD;  Started 05-October-2008 Active 11/14/2014: Received from: [facility]  . fluticasone (FLONASE) 50 MCG/ACT nasal spray Place into the nose. 11/14/2014: Received from: [facility]  . glucose blood test strip  05/23/2015: Received from: [facility]  . loratadine (CLARITIN) 10 MG tablet Take 1 tablet (10 mg total) by mouth daily.   . metFORMIN (GLUCOPHAGE) 500 MG tablet Take 1 tablet (500 mg total) by mouth every evening.   Marland Kitchen telmisartan-hydrochlorothiazide (MICARDIS HCT) 80-25 MG tablet Take 1 tablet by mouth daily.    No facility-administered  encounter medications on file as of 04/22/2017.     Allergies  Allergen Reactions  . Oxycodone Other (See Comments)  . Oxycodone Hcl     insomnia, agitation    Care Team Updated in EHR: Yes Dwayne Aida Raider: Cardiologist   Last Vision Exam: 08/2016 Wears corrective lenses: Yes Last Dental Exam: 09/2016 Last Hearing Exam: 08/2016 Wears Hearing Aids: No  Functional Ability / Safety Screening 1.  Was the timed Get Up and Go test shorter than 30 seconds?  yes 2.  Does the patient need help with the phone, transportation, shopping,      preparing meals, housework, laundry, medications, or managing money?  no 3.  Is the patient's home free of loose throw rugs in walkways, pet beds, electrical cords, etc?   yes      Grab bars in the bathroom? no      Handrails on the stairs?   no      Adequate lighting?   yes 4.  Has the patient noticed any hearing difficulties?   no  Diet Recall and Exercise Regimen: ***  Advanced Care Planning: A voluntary discussion about advance care planning including the explanation and discussion of advance directives.  Discussed health care proxy and Living will, and the patient was able to identify a health care proxy as Loida Calamia.  Patient does have a living will at present time. If patient does have living will, I have requested they bring this to the clinic to be scanned in to their chart. Does patient have a HCPOA?    yes If yes, name and contact information:  Does patient have a living will or MOST form?  yes  Cancer Screenings: Skin: *** Lung: *** Low Dose CT Chest recommended if Age 63-80 years, 30 pack-year currently smoking OR have quit w/in 15years. Patient does not qualify. Breast:  08/17/2018Up to date on Mammogram? Yes  Up to date of Bone Density/Dexa? Yes Colon: UTD 11/30/2014  Additional Screenings: *** Hepatitis B/HIV/Syphillis: Hepatitis C Screening: 01/30/2012 Intimate Partner Violence:   Objective:   Vitals: BP  114/76 (BP Location: Left Arm, Patient Position: Sitting, Cuff Size: Normal)   Pulse 87   Temp (!) 97.4 F (36.3 C) (Oral)   Resp 14   Ht 5' 0.63" (1.54 m)   Wt 210 lb 8 oz (95.5 kg)   SpO2 98%   BMI 40.26 kg/m  Body mass index is 40.26 kg/m.  No exam data present  Physical Exam Constitutional: Patient appears well-developed and well-nourished. Obese *** No distress.  HEENT: head atraumatic, normocephalic, pupils equal and reactive to light, ears ***, neck supple, throat within normal limits Cardiovascular: Normal rate, regular rhythm and normal heart sounds.  No murmur heard. No BLE edema. Pulmonary/Chest: Effort normal and breath sounds normal. No respiratory distress. Abdominal: Soft.  There is no  tenderness. Psychiatric: Patient has a normal mood and affect. behavior is normal. Judgment and thought content normal.  Cognitive Testing - 6-CIT  Correct? Score   What year is it? {YES NO:22349} {Numbers; 0-4:31231} Yes = 0    No = 4  What month is it? {YES NO:22349} {Numbers; 0-4:31231} Yes = 0    No = 3  Remember:     Pia Mau, [place], [place]     What time is it? {YES NO:22349} {Numbers; 0-4:31231} Yes = 0    No = 3  Count backwards from 20 to 1 {YES NO:22349} {Numbers; 0-4:31231} Correct = 0    1 error = 2   More than 1 error = 4  Say the months of the year in reverse. {YES NO:22349} {Numbers; 0-4:31231} Correct = 0    1 error = 2   More than 1 error = 4  What address did I ask you to remember? {YES NO:22349} {NUMBERS; 0-10:5044} Correct = 0  1 error = 2    2 error = 4    3 error = 6    4 error = 8    All wrong = 10       TOTAL SCORE  {Numbers; 6-60:63016}/01   Interpretation:  {Desc; normal/abnormal:11317::"Normal"}  Normal (0-7) Abnormal (8-28)   Fall Risk: Fall Risk  04/22/2017 03/04/2017 12/04/2016 05/27/2016 01/22/2016  Falls in the past year? No No No No No    Depression Screen Depression screen [facility] 2/9 04/22/2017 03/04/2017 12/04/2016 05/27/2016 01/22/2016   Decreased Interest 0 0 0 0 0  Down, Depressed, Hopeless 0 0 0 0 0  PHQ - 2 Score 0 0 0 0 0    Recent Results (from the past 2160 hour(s))  CBC with Differential/Platelet     Status: None   Collection Time: 03/04/17 11:55 AM  Result Value Ref Range   WBC 8.1 3.8 - 10.8 Thousand/uL   RBC 4.96 3.80 - 5.10 Million/uL   Hemoglobin 13.5 11.7 - 15.5 g/dL   HCT 40.9 35.0 - 45.0 %   MCV 82.5 80.0 - 100.0 fL   MCH 27.2 27.0 - 33.0 pg   MCHC 33.0 32.0 - 36.0 g/dL   RDW 14.8 11.0 - 15.0 %   Platelets 364 140 - 400 Thousand/uL   MPV 11.1 7.5 - 12.5 fL   Neutro Abs 5,565 1,500 - 7,800 cells/uL   Lymphs Abs 1,790 850 - 3,900 cells/uL   WBC mixed population 494 200 - 950 cells/uL   Eosinophils Absolute 194 15 - 500 cells/uL   Basophils Absolute 57 0 - 200 cells/uL   Neutrophils Relative % 68.7 %   Total Lymphocyte 22.1 %   Monocytes Relative 6.1 %   Eosinophils Relative 2.4 %   Basophils Relative 0.7 %  COMPLETE METABOLIC PANEL WITH GFR     Status: Abnormal   Collection Time: 03/04/17 11:55 AM  Result Value Ref Range   Glucose, Bld 91 65 - 99 mg/dL    Comment: .            Fasting reference interval .    BUN 13 7 - 25 mg/dL   Creat 1.00 (H) 0.50 - 0.99 mg/dL    Comment: For patients >63 years of age, the reference limit for Creatinine is approximately 13% higher for people identified as African-American. .    GFR, Est Non African American 58 (L) > OR = 60 mL/min/1.57m2   GFR, Est African American 68 >  OR = 60 mL/min/1.34m2   BUN/Creatinine Ratio 13 6 - 22 (calc)   Sodium 137 135 - 146 mmol/L   Potassium 3.7 3.5 - 5.3 mmol/L   Chloride 99 98 - 110 mmol/L   CO2 28 20 - 32 mmol/L   Calcium 10.3 8.6 - 10.4 mg/dL   Total Protein 7.5 6.1 - 8.1 g/dL   Albumin 4.5 3.6 - 5.1 g/dL   Globulin 3.0 1.9 - 3.7 g/dL (calc)   AG Ratio 1.5 1.0 - 2.5 (calc)   Total Bilirubin 0.6 0.2 - 1.2 mg/dL   Alkaline phosphatase (APISO) 80 33 - 130 U/L   AST 19 10 - 35 U/L   ALT 18 6 - 29 U/L   Hemoglobin A1c     Status: Abnormal   Collection Time: 03/04/17 11:55 AM  Result Value Ref Range   Hgb A1c MFr Bld 6.2 (H) <5.7 % of total Hgb    Comment: For someone without known diabetes, a hemoglobin  A1c value between 5.7% and 6.4% is consistent with prediabetes and should be confirmed with a  follow-up test. . For someone with known diabetes, a value <7% indicates that their diabetes is well controlled. A1c targets should be individualized based on duration of diabetes, age, comorbid conditions, and other considerations. . This assay result is consistent with an increased risk of diabetes. . Currently, no consensus exists regarding use of hemoglobin A1c for diagnosis of diabetes for children. .    Mean Plasma Glucose 131 (calc)   eAG (mmol/L) 7.3 (calc)  VITAMIN D 25 Hydroxy (Vit-D Deficiency, Fractures)     Status: None   Collection Time: 03/04/17 11:55 AM  Result Value Ref Range   Vit D, 25-Hydroxy 39 30 - 100 ng/mL    Comment: Vitamin D Status         25-OH Vitamin D: . Deficiency:                    <20 ng/mL Insufficiency:             20 - 29 ng/mL Optimal:                 > or = 30 ng/mL . For 25-OH Vitamin D testing on patients on  D2-supplementation and patients for whom quantitation  of D2 and D3 fractions is required, the QuestAssureD(TM) 25-OH VIT D, (D2,D3), LC/MS/MS is recommended: order  code [phone] (patients >69yrs). . For more information on this test, go to: http://education.quest[facility]ostics.com/faq/FAQ163 (This link is being provided for  informational/educational purposes only.)   Lipid panel     Status: Abnormal   Collection Time: 03/04/17 11:55 AM  Result Value Ref Range   Cholesterol 127 <200 mg/dL   HDL 44 (L) >50 mg/dL   Triglycerides 124 <150 mg/dL   LDL Cholesterol (Calc) 62 mg/dL (calc)    Comment: Reference range: <100 . Desirable range <100 mg/dL for primary prevention;   <70 mg/dL for patients with CHD or diabetic patients   with > or = 2 CHD risk factors. Marland Kitchen LDL-C is now calculated using the Martin-Hopkins  calculation, which is a validated novel method providing  better accuracy than the Friedewald equation in the  estimation of LDL-C.  Cresenciano Genre et al. Annamaria Helling. 2637;858(85): 2061-2068  (http://education.[facility].com/faq/FAQ164)    Total CHOL/HDL Ratio 2.9 <5.0 (calc)   Non-HDL Cholesterol (Calc) 83 <130 mg/dL (calc)    Comment: For patients with diabetes plus 1 major ASCVD risk  factor, treating to a  non-HDL-C goal of <100 mg/dL  (LDL-C of <70 mg/dL) is considered a therapeutic  option.   Urine Microalbumin w/creat. ratio     Status: None   Collection Time: 03/04/17 11:55 AM  Result Value Ref Range   Creatinine, Urine 47 20 - 275 mg/dL   Microalb, Ur 0.2 mg/dL    Comment: Reference Range Not established    Microalb Creat Ratio 4 <30 mcg/mg creat    Comment: . The ADA defines abnormalities in albumin excretion as follows: Marland Kitchen Category         Result (mcg/mg creatinine) . Normal                    <30 Microalbuminuria         30-299  Clinical albuminuria   > OR = 300 . The ADA recommends that at least two of three specimens collected within a 3-6 month period be abnormal before considering a patient to be within a diagnostic category.   Urinalysis, Routine w reflex microscopic     Status: None   Collection Time: 03/04/17 11:55 AM  Result Value Ref Range   Color, Urine YELLOW YELLOW   APPearance CLEAR CLEAR   Specific Gravity, Urine 1.005 1.001 - 1.03   pH 6.0 5.0 - 8.0   Glucose, UA NEGATIVE NEGATIVE   Bilirubin Urine NEGATIVE NEGATIVE   Ketones, ur NEGATIVE NEGATIVE   Hgb urine dipstick NEGATIVE NEGATIVE   Protein, ur NEGATIVE NEGATIVE   Nitrite NEGATIVE NEGATIVE   Leukocytes, UA NEGATIVE NEGATIVE  POCT urinalysis dipstick     Status: Abnormal   Collection Time: 03/19/17  1:12 PM  Result Value Ref Range   Color, UA yellow    Clarity, UA clear    Glucose, UA negative     Bilirubin, UA negative    Ketones, UA negative    Spec Grav, UA 1.010 1.010 - 1.025   Blood, UA large    pH, UA 6.0 5.0 - 8.0   Protein, UA 30    Urobilinogen, UA 0.2 0.2 or 1.0 E.U./dL   Nitrite, UA negative    Leukocytes, UA Large (3+) (A) Negative  Urine Culture     Status: None   Collection Time: 03/19/17  3:12 PM  Result Value Ref Range   MICRO NUMBER: [id number]    SPECIMEN QUALITY: ADEQUATE    Sample Source URINE    STATUS: FINAL    ISOLATE 1:      Three or more organisms present, each greater than 10,000 cu/mL. May represent normal flora contamination from external genitalia. No further testing is required.  POCT urinalysis dipstick     Status: Normal   Collection Time: 04/02/17  8:34 AM  Result Value Ref Range   Color, UA amber    Clarity, UA clear    Glucose, UA negative    Bilirubin, UA negative    Ketones, UA negative    Spec Grav, UA 1.025 1.010 - 1.025   Blood, UA negative    pH, UA 6.0 5.0 - 8.0   Protein, UA trace    Urobilinogen, UA 0.2 0.2 or 1.0 E.U./dL   Nitrite, UA negative    Leukocytes, UA Negative Negative    Assessment & Plan:    There are no diagnoses linked to this encounter. *** type dotphrase "dot"diagmed to refresh this list  Exercise Activities and Dietary recommendations Goals    None     - Discussed health benefits of physical activity, and encouraged her  to engage in regular exercise appropriate for her age and condition.   Immunization History  Administered Date(s) Administered  . Influenza Split 02/23/2008  . Influenza-Unspecified 01/25/2014, 01/23/2015, 01/30/2016, 02/06/2017  . Pneumococcal Conjugate-13 01/17/2015  . Pneumococcal Polysaccharide-23 06/26/2010, 02/20/2016  . Tdap 06/26/2010  . Zoster 03/06/2011    Health Maintenance  Topic Date Due  . OPHTHALMOLOGY EXAM  08/27/2017  . HEMOGLOBIN A1C  09/01/2017  . FOOT EXAM  12/04/2017  . MAMMOGRAM  12/20/2018  . COLONOSCOPY  11/30/2019  . TETANUS/TDAP  06/26/2020  .  INFLUENZA VACCINE  Completed  . DEXA SCAN  Completed  . Hepatitis C Screening  Completed  . PNA vac Low Risk Adult  Completed    No orders of the defined types were placed in this encounter.   Current Outpatient Medications:  .  acetaminophen (TYLENOL) 500 MG tablet, Take by mouth., Disp: , Rfl:  .  amLODipine (NORVASC) 10 MG tablet, Take 1 tablet (10 mg total) by mouth daily., Disp: 90 tablet, Rfl: 1 .  aspirin EC 81 MG tablet, Take by mouth., Disp: , Rfl:  .  atorvastatin (LIPITOR) 40 MG tablet, Take 1 tablet (40 mg total) by mouth daily., Disp: 90 tablet, Rfl: 1 .  Cholecalciferol (VITAMIN D3) 2000 UNITS capsule, VITAMIN D3, 2000UNIT (Oral Capsule)  2 po qday for 0 days  Quantity: 60.00;  Refills: 0   Ordered :15-May-2010  Steele Sizer MD;  Started 05-October-2008 Active, Disp: , Rfl:  .  fluticasone (FLONASE) 50 MCG/ACT nasal spray, Place into the nose., Disp: , Rfl:  .  glucose blood test strip, , Disp: , Rfl:  .  loratadine (CLARITIN) 10 MG tablet, Take 1 tablet (10 mg total) by mouth daily., Disp: 30 tablet, Rfl: 0 .  metFORMIN (GLUCOPHAGE) 500 MG tablet, Take 1 tablet (500 mg total) by mouth every evening., Disp: 90 tablet, Rfl: 1 .  telmisartan-hydrochlorothiazide (MICARDIS HCT) 80-25 MG tablet, Take 1 tablet by mouth daily., Disp: 90 tablet, Rfl: 1 There are no discontinued medications.  I have personally reviewed and addressed the Medicare Annual Wellness health risk assessment questionnaire and have noted the following in the patient's chart:  A.         Medical and social history & family history B.         Use of alcohol, tobacco, and illicit drugs  C.         Current medications and supplements D.         Functional and Cognitive ability and status E.         Nutritional status F.         Physical activity G.        Advance directives H.         List of other physicians I.          Hospitalizations, surgeries, and ER visits in previous 12 months J.         [place] such as hearing, vision, cognitive function, and depression L.         Referrals and appointments: ***  In addition, I have reviewed and discussed with patient certain preventive protocols, quality metrics, and best practice recommendations. A written personalized care plan for preventive services as well as general preventive health recommendations were provided to patient.  See attached scanned questionnaire for additional information.   No Follow-up on file. *** refresh to show

## 2017-04-23 ENCOUNTER — Other Ambulatory Visit: Payer: Self-pay | Admitting: Family Medicine

## 2017-04-23 DIAGNOSIS — E785 Hyperlipidemia, unspecified: Secondary | ICD-10-CM

## 2017-04-23 DIAGNOSIS — N182 Chronic kidney disease, stage 2 (mild): Secondary | ICD-10-CM

## 2017-04-23 DIAGNOSIS — I10 Essential (primary) hypertension: Secondary | ICD-10-CM

## 2017-04-23 DIAGNOSIS — E1122 Type 2 diabetes mellitus with diabetic chronic kidney disease: Secondary | ICD-10-CM

## 2017-04-23 NOTE — Telephone Encounter (Signed)
She was just here yesterday for physical.

## 2017-04-27 LAB — PAP IG AND HPV HIGH-RISK: HPV DNA HIGH RISK: NOT DETECTED

## 2017-06-01 ENCOUNTER — Ambulatory Visit
Admission: RE | Admit: 2017-06-01 | Discharge: 2017-06-01 | Disposition: A | Discharge status: Home / Self Care | Payer: Medicare Other | Source: Ambulatory Visit | Attending: Family Medicine | Admitting: Family Medicine

## 2017-06-01 DIAGNOSIS — E2839 Other primary ovarian failure: Secondary | ICD-10-CM | POA: Diagnosis present

## 2017-06-01 DIAGNOSIS — Z780 Asymptomatic menopausal state: Secondary | ICD-10-CM | POA: Diagnosis not present

## 2017-06-01 DIAGNOSIS — Z13820 Encounter for screening for osteoporosis: Secondary | ICD-10-CM | POA: Diagnosis not present

## 2017-06-09 ENCOUNTER — Ambulatory Visit (INDEPENDENT_AMBULATORY_CARE_PROVIDER_SITE_OTHER): Payer: Medicare Other | Admitting: Family Medicine

## 2017-06-09 ENCOUNTER — Encounter: Payer: Self-pay | Admitting: Family Medicine

## 2017-06-09 VITALS — BP 116/60 | HR 94 | Temp 97.6°F | Resp 14 | Ht 61.0 in | Wt 208.9 lb

## 2017-06-09 DIAGNOSIS — E559 Vitamin D deficiency, unspecified: Secondary | ICD-10-CM | POA: Diagnosis not present

## 2017-06-09 DIAGNOSIS — E1122 Type 2 diabetes mellitus with diabetic chronic kidney disease: Secondary | ICD-10-CM | POA: Diagnosis not present

## 2017-06-09 DIAGNOSIS — M170 Bilateral primary osteoarthritis of knee: Secondary | ICD-10-CM | POA: Diagnosis not present

## 2017-06-09 DIAGNOSIS — J302 Other seasonal allergic rhinitis: Secondary | ICD-10-CM | POA: Diagnosis not present

## 2017-06-09 DIAGNOSIS — I10 Essential (primary) hypertension: Secondary | ICD-10-CM

## 2017-06-09 DIAGNOSIS — N182 Chronic kidney disease, stage 2 (mild): Secondary | ICD-10-CM

## 2017-06-09 DIAGNOSIS — K219 Gastro-esophageal reflux disease without esophagitis: Secondary | ICD-10-CM

## 2017-06-09 LAB — POCT GLUCOSE (DEVICE FOR HOME USE): POC GLUCOSE: 84 mg/dL (ref 70–99)

## 2017-06-09 LAB — POCT GLYCOSYLATED HEMOGLOBIN (HGB A1C): HEMOGLOBIN A1C: 6.5

## 2017-06-09 NOTE — Progress Notes (Signed)
Name: Yvonne Curtis   MRN: [id number]    DOB: 09/23/1950   Date:06/09/2017       Progress Note  Subjective  Chief Complaint  Chief Complaint  Patient presents with  . Diabetes  . Hypertension  . Hyperlipidemia    HPI  DMII with renal manifestation: she has been taking Metformin daily and denies side effects, she has not been checking glucose at home.  She denies polyphagia, polyuria or polydipsia. Last urine micro normal  used to be high in the past. She is on ARB - denies side effects. She has been eating healthy, cooking at home, she states she will increase physical activity  HTN: she has been taking her medications as prescribed and denies side effects. Denies orthostatic changes, chest pain or palpitation. Mild decrease in weight since last visit  Hyperlipidemia: Taking Lipitor and denies side effects. No myalgia. Labs reviewed  Obesity: She has been using her stationary bike a couple times a week for about 20 minutes, walks outside sometimes, but really likes to dance at home, she has been eating healthier and has lost weight since last visit.   OA knee: she states her knees no longer bothering her since she stopped working.  Patient Active Problem List   Diagnosis Date Noted  . Hematuria 02/17/2015  . Acanthosis nigricans 11/14/2014  . Benign hypertension 11/14/2014  . Dyslipidemia 11/14/2014  . Gastro-esophageal reflux disease without esophagitis 11/14/2014  . Extreme obesity 11/14/2014  . Primary osteoarthritis of both knees 11/14/2014  . Microalbuminuria 10/15/2012  . Diabetes mellitus with renal manifestations, controlled ([place]) 06/05/2009  . Body dermatophytosis 12/13/2008    Past Surgical History:  Procedure Laterality Date  . COLONOSCOPY WITH PROPOFOL N/A 11/30/2014   Procedure: COLONOSCOPY WITH PROPOFOL;  Surgeon: Lucilla Lame, MD;  Location: [place];  Service: Endoscopy;  Laterality: N/A;  WITH BIOPSY-- TRANSVERSE COLON POLYP  X 2 DESCENING  COLON POLYP  . GALLBLADDER SURGERY    . SEPTOPLASTY    . tonsillectomy    . TUBAL LIGATION      Family History  Problem Relation Age of Onset  . Colon cancer Mother   . Lung cancer Father   . Hypertension Sister   . Hypertension Sister     Social History   Socioeconomic History  . Marital status: Married    Spouse name: Not on file  . Number of children: Not on file  . Years of education: Not on file  . Highest education level: Not on file  Social Needs  . Financial resource strain: Not on file  . Food insecurity - worry: Not on file  . Food insecurity - inability: Not on file  . Transportation needs - medical: Not on file  . Transportation needs - non-medical: Not on file  Occupational History  . Not on file  Tobacco Use  . Smoking status: Never Smoker  . Smokeless tobacco: Never Used  Substance and Sexual Activity  . Alcohol use: No    Alcohol/week: 0.0 oz  . Drug use: No  . Sexual activity: Yes  Other Topics Concern  . 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    She dances for physical activity, hanging with girlfriend   She has two grandchildren, one is in college     Current Outpatient Medications:  .  acetaminophen (TYLENOL) 500 MG tablet, Take by mouth., Disp: , Rfl:  .  amLODipine (NORVASC) 10 MG tablet, TAKE 1 TABLET BY MOUTH  DAILY, Disp: 90 tablet, Rfl: 1 .  aspirin EC 81 MG tablet, Take by mouth., Disp: , Rfl:  .  atorvastatin (LIPITOR) 40 MG tablet, TAKE 1 TABLET BY MOUTH  DAILY, Disp: 90 tablet, Rfl: 1 .  Cholecalciferol (VITAMIN D3) 2000 UNITS capsule, VITAMIN D3, 2000UNIT (Oral Capsule)  2 po qday for 0 days  Quantity: 60.00;  Refills: 0   Ordered :15-May-2010  Steele Sizer MD;  Started 05-October-2008 Active, Disp: , Rfl:  .  fluticasone (FLONASE) 50 MCG/ACT nasal spray, Place into the nose., Disp: , Rfl:  .  glucose blood test strip, , Disp: , Rfl:  .   loratadine (CLARITIN) 10 MG tablet, Take 1 tablet (10 mg total) by mouth daily., Disp: 30 tablet, Rfl: 0 .  metFORMIN (GLUCOPHAGE) 500 MG tablet, TAKE 1 TABLET BY MOUTH  EVERY EVENING, Disp: 90 tablet, Rfl: 1 .  telmisartan-hydrochlorothiazide (MICARDIS HCT) 80-25 MG tablet, TAKE 1 TABLET BY MOUTH  DAILY, Disp: 90 tablet, Rfl: 1  Allergies  Allergen Reactions  . Oxycodone Other (See Comments)  . Oxycodone Hcl     insomnia, agitation     ROS  Constitutional: Negative for fever or weight change.  Respiratory: Negative for cough and shortness of breath.   Cardiovascular: Negative for chest pain or palpitations.  Gastrointestinal: Negative for abdominal pain, no bowel changes.  Musculoskeletal: Negative for gait problem or joint swelling.  Skin: Negative for rash.  Neurological: Negative for dizziness or headache.  No other specific complaints in a complete review of systems (except as listed in HPI above).  Objective  Vitals:   06/09/17 0800  BP: 116/60  Pulse: 94  Resp: 14  Temp: 97.6 F (36.4 C)  TempSrc: Oral  SpO2: 96%  Weight: 208 lb 14.4 oz (94.8 kg)  Height: 5\' 1"  (1.549 m)    Body mass index is 39.47 kg/m.  Physical Exam  Constitutional: Patient appears well-developed and well-nourished. Obese  No distress.  HEENT: head atraumatic, normocephalic, pupils equal and reactive to light,  neck supple, throat within normal limits Cardiovascular: Normal rate, regular rhythm and normal heart sounds.  No murmur heard. No BLE edema. Pulmonary/Chest: Effort normal and breath sounds normal. No respiratory distress. Abdominal: Soft.  There is no tenderness. Psychiatric: Patient has a normal mood and affect. behavior is normal. Judgment and thought content normal.  Recent Results (from the past 2160 hour(s))  POCT urinalysis dipstick     Status: Abnormal   Collection Time: 03/19/17  1:12 PM  Result Value Ref Range   Color, UA yellow    Clarity, UA clear    Glucose, UA  negative    Bilirubin, UA negative    Ketones, UA negative    Spec Grav, UA 1.010 1.010 - 1.025   Blood, UA large    pH, UA 6.0 5.0 - 8.0   Protein, UA 30    Urobilinogen, UA 0.2 0.2 or 1.0 E.U./dL   Nitrite, UA negative    Leukocytes, UA Large (3+) (A) Negative  Urine Culture     Status: None   Collection Time: 03/19/17  3:12 PM  Result Value Ref Range   MICRO NUMBER: [id number]    SPECIMEN QUALITY: ADEQUATE    Sample Source URINE    STATUS: FINAL    ISOLATE 1:      Three or more organisms present, each greater than 10,000 cu/mL. May represent normal flora contamination from external genitalia. No  further testing is required.  POCT urinalysis dipstick     Status: Normal   Collection Time: 04/02/17  8:34 AM  Result Value Ref Range   Color, UA amber    Clarity, UA clear    Glucose, UA negative    Bilirubin, UA negative    Ketones, UA negative    Spec Grav, UA 1.025 1.010 - 1.025   Blood, UA negative    pH, UA 6.0 5.0 - 8.0   Protein, UA trace    Urobilinogen, UA 0.2 0.2 or 1.0 E.U./dL   Nitrite, UA negative    Leukocytes, UA Negative Negative  Pap IG and HPV (high risk) DNA detection     Status: None   Collection Time: 04/22/17 11:42 AM  Result Value Ref Range   Clinical Information:      Comment: None given   LMP:      Comment: POSTMET   PREV. PAP:      Comment: NONE GIVEN   PREV. BX:      Comment: NONE GIVEN   HPV DNA Probe-Source      Comment: Endocervix   STATEMENT OF ADEQUACY:      Comment: SATISFACTORY FOR EVALUATION   INTERPRETATION/RESULT:      Comment: Negative for intraepithelial lesion or malignancy.   Comment:      Comment: This Pap test has been evaluated with computer assisted technology.    CYTOTECHNOLOGIST:      Comment: EAW, CT(ASCP) CT screening location: [address], [place], [place] [address]    HPV DNA High Risk Not Detected Not Detect    Comment: This test was performed using the APTIMA HPV Assay ([employer] . This  assay detects E6/E7 viral messenger RNA (mRNA) from 14 high-risk HPV types (16,18,31,33,35,39,45,51,52,56,58,59,66,68). . The analytical performance characteristics of this assay have been determined by [facility]. The modifications have not been cleared or approved by the FDA. This assay has been validated pursuant to the CLIA regulations and is used for clinical purposes. EXPLANATORY NOTE:  . The Pap is a screening test for cervical cancer. It is  not a diagnostic test and is subject to false negative  and false positive results. It is most reliable when a  satisfactory sample, regularly obtained, is submitted  with relevant clinical findings and history, and when  the Pap result is evaluated along with historic and  current clinical information. Marland Kitchen   POCT Glucose (Device for Home Use)     Status: Normal   Collection Time: 06/09/17  8:10 AM  Result Value Ref Range   Glucose Fasting, POC  70 - 99 mg/dL   POC Glucose 84 70 - 99 mg/dl  POCT HgB A1C     Status: Abnormal   Collection Time: 06/09/17  8:10 AM  Result Value Ref Range   Hemoglobin A1C 6.5       PHQ2/9: Depression screen [facility] 2/9 04/22/2017 03/04/2017 12/04/2016 05/27/2016 01/22/2016  Decreased Interest 0 0 0 0 0  Down, Depressed, Hopeless 0 0 0 0 0  PHQ - 2 Score 0 0 0 0 0     Fall Risk: Fall Risk  06/09/2017 04/22/2017 03/04/2017 12/04/2016 05/27/2016  Falls in the past year? No No No No No     Functional Status Survey: Is the patient deaf or have difficulty hearing?: No Does the patient have difficulty seeing, even when wearing glasses/contacts?: No Does the patient have difficulty concentrating, remembering, or making decisions?: No Does the patient have difficulty walking  or climbing stairs?: No Does the patient have difficulty dressing or bathing?: No Does the patient have difficulty doing errands alone such as visiting a doctor's office or shopping?: No    Assessment & Plan  1. Controlled type 2  diabetes mellitus with stage 2 chronic kidney disease, without long-term current use of insulin (HCC)  At goal, continue medication  - POCT Glucose (Device for Home Use) - POCT HgB A1C  2. Benign hypertension  At goal  3. Vitamin D deficiency  Continue otc supplementation   4. Morbid obesity, unspecified obesity type [facility])  Discussed with the patient the risk posed by an increased BMI. Discussed importance of portion control, calorie counting and at least 150 minutes of physical activity weekly. Avoid sweet beverages and drink more water. Eat at least 6 servings of fruit and vegetables daily   5. Gastro-esophageal reflux disease without esophagitis  Under control, using prn medication   6. Other seasonal allergic rhinitis  Continue prn medication  7. Primary osteoarthritis of both knees  Doing well on Tylenol prn , symptoms much better controlled since she retired

## 2017-10-14 ENCOUNTER — Ambulatory Visit (INDEPENDENT_AMBULATORY_CARE_PROVIDER_SITE_OTHER): Payer: Medicare Other | Admitting: Family Medicine

## 2017-10-14 ENCOUNTER — Encounter: Payer: Self-pay | Admitting: Family Medicine

## 2017-10-14 VITALS — BP 120/70 | HR 104 | Resp 16 | Ht 61.0 in | Wt 208.0 lb

## 2017-10-14 DIAGNOSIS — K219 Gastro-esophageal reflux disease without esophagitis: Secondary | ICD-10-CM | POA: Diagnosis not present

## 2017-10-14 DIAGNOSIS — I10 Essential (primary) hypertension: Secondary | ICD-10-CM

## 2017-10-14 DIAGNOSIS — N182 Chronic kidney disease, stage 2 (mild): Secondary | ICD-10-CM | POA: Diagnosis not present

## 2017-10-14 DIAGNOSIS — E785 Hyperlipidemia, unspecified: Secondary | ICD-10-CM | POA: Diagnosis not present

## 2017-10-14 DIAGNOSIS — E1122 Type 2 diabetes mellitus with diabetic chronic kidney disease: Secondary | ICD-10-CM

## 2017-10-14 DIAGNOSIS — E559 Vitamin D deficiency, unspecified: Secondary | ICD-10-CM

## 2017-10-14 DIAGNOSIS — M170 Bilateral primary osteoarthritis of knee: Secondary | ICD-10-CM

## 2017-10-14 LAB — COMPLETE METABOLIC PANEL WITH GFR
AG RATIO: 1.5 (calc) (ref 1.0–2.5)
ALKALINE PHOSPHATASE (APISO): 80 U/L (ref 33–130)
ALT: 16 U/L (ref 6–29)
AST: 16 U/L (ref 10–35)
Albumin: 4.3 g/dL (ref 3.6–5.1)
BUN: 13 mg/dL (ref 7–25)
CO2: 31 mmol/L (ref 20–32)
Calcium: 10.4 mg/dL (ref 8.6–10.4)
Chloride: 99 mmol/L (ref 98–110)
Creat: 0.9 mg/dL (ref 0.50–0.99)
GFR, Est African American: 77 mL/min/{1.73_m2} (ref >=60)
GFR, Est Non African American: 66 mL/min/{1.73_m2} (ref >=60)
GLOBULIN: 2.9 g/dL (ref 1.9–3.7)
Glucose, Bld: 92 mg/dL (ref 65–99)
POTASSIUM: 3.6 mmol/L (ref 3.5–5.3)
SODIUM: 137 mmol/L (ref 135–146)
Total Bilirubin: 0.6 mg/dL (ref 0.2–1.2)
Total Protein: 7.2 g/dL (ref 6.1–8.1)

## 2017-10-14 LAB — CBC WITH DIFFERENTIAL/PLATELET
BASOS ABS: 62 {cells}/uL (ref 0–200)
Basophils Relative: 0.8 %
EOS ABS: 257 {cells}/uL (ref 15–500)
Eosinophils Relative: 3.3 %
HEMATOCRIT: 39.3 % (ref 35.0–45.0)
HEMOGLOBIN: 13.3 g/dL (ref 11.7–15.5)
LYMPHS ABS: 1732 {cells}/uL (ref 850–3900)
MCH: 27.7 pg (ref 27.0–33.0)
MCHC: 33.8 g/dL (ref 32.0–36.0)
MCV: 81.9 fL (ref 80.0–100.0)
MPV: 10.7 fL (ref 7.5–12.5)
Monocytes Relative: 6 %
NEUTROS ABS: 5281 {cells}/uL (ref 1500–7800)
Neutrophils Relative %: 67.7 %
Platelets: 380 10*3/uL (ref 140–400)
RBC: 4.8 10*6/uL (ref 3.80–5.10)
RDW: 14.4 % (ref 11.0–15.0)
Total Lymphocyte: 22.2 %
WBC: 7.8 10*3/uL (ref 3.8–10.8)
WBCMIX: 468 {cells}/uL (ref 200–950)

## 2017-10-14 LAB — LIPID PANEL
CHOLESTEROL: 124 mg/dL (ref <200)
HDL: 45 mg/dL — AB (ref >50)
LDL Cholesterol (Calc): 60 mg/dL (calc)
Non-HDL Cholesterol (Calc): 79 mg/dL (calc) (ref <130)
TRIGLYCERIDES: 106 mg/dL (ref <150)
Total CHOL/HDL Ratio: 2.8 (calc) (ref <5.0)

## 2017-10-14 LAB — POCT GLYCOSYLATED HEMOGLOBIN (HGB A1C): Hemoglobin A1C: 6.1 % — AB (ref 4.0–5.6)

## 2017-10-14 MED ORDER — AMLODIPINE BESYLATE 10 MG PO TABS: 10.0000 mg | ORAL_TABLET | Freq: Every day | ORAL | 1 refills | Status: DC

## 2017-10-14 MED ORDER — TELMISARTAN-HCTZ 80-25 MG PO TABS: 1.0000 | ORAL_TABLET | Freq: Every day | ORAL | 1 refills | Status: DC

## 2017-10-14 MED ORDER — ATORVASTATIN CALCIUM 40 MG PO TABS: 40.0000 mg | ORAL_TABLET | Freq: Every day | ORAL | 1 refills | Status: DC

## 2017-10-14 MED ORDER — METFORMIN HCL 500 MG PO TABS: 500.0000 mg | ORAL_TABLET | Freq: Every evening | ORAL | 1 refills | Status: DC

## 2017-10-14 NOTE — Progress Notes (Signed)
Name: Yvonne Curtis   MRN: [id number]    DOB: Dec 01, 1950   Date:10/14/2017       Progress Note  Subjective  Chief Complaint  Chief Complaint  Patient presents with  . Diabetes    HPI  DMII with renal manifestation: she has been taking Metformin daily and denies side effects, she has not been checking glucose at home.She denies polyphagia, polyuria or polydipsia. Lasturine micro normal  used to be high in the past. She is on ARB - denies side effects.She has been eating healthy, cooking at home, she is up to date with foot exam and urine micro but due for eye exam and will contact ophthalmologist today   HTN: she has been taking her medications as prescribed and denies side effects. Denies orthostatic changes or chest pain. BP towards low end of normal, but denies dizziness and is happy with current regiment.   Hyperlipidemia: Taking Lipitor and denies side effects. No myalgia.Recheck labs  Obesity: She has been using her stationary bike a couple times a week for about 20 minutes, walks outside sometimes, but really likes to dance at home, she has been eating healthier most of the time. Weight has been stable.   OA knee: she states her knees no longer bothering her since she stopped working. She dances at home for physical activity    Patient Active Problem List   Diagnosis Date Noted  . Hematuria 02/17/2015  . Acanthosis nigricans 11/14/2014  . Benign hypertension 11/14/2014  . Dyslipidemia 11/14/2014  . Gastro-esophageal reflux disease without esophagitis 11/14/2014  . Extreme obesity 11/14/2014  . Primary osteoarthritis of both knees 11/14/2014  . Microalbuminuria 10/15/2012  . Diabetes mellitus with renal manifestations, controlled ([place]) 06/05/2009  . Body dermatophytosis 12/13/2008    Past Surgical History:  Procedure Laterality Date  . COLONOSCOPY WITH PROPOFOL N/A 11/30/2014   Procedure: COLONOSCOPY WITH PROPOFOL;  Surgeon: Lucilla Lame, MD;  Location: [place];  Service: Endoscopy;  Laterality: N/A;  WITH BIOPSY-- TRANSVERSE COLON POLYP  X 2 DESCENING COLON POLYP  . GALLBLADDER SURGERY    . SEPTOPLASTY    . tonsillectomy    . TUBAL LIGATION      Family History  Problem Relation Age of Onset  . Colon cancer Mother   . Lung cancer Father   . Hypertension Sister   . Hypertension Sister     Social History   Socioeconomic History  . Marital status: Married    Spouse name: Not on file  . Number of children: Not on file  . Years of education: Not on file  . Highest education level: Not on file  Occupational History  . Not on file  Social Needs  . Financial resource strain: Not on file  . Food insecurity:    Worry: Not on file    Inability: Not on file  . Transportation needs:    Medical: Not on file    Non-medical: Not on file  Tobacco Use  . Smoking status: Never Smoker  . Smokeless tobacco: Never Used  Substance and Sexual Activity  . Alcohol use: No    Alcohol/week: 0.0 oz  . Drug use: No  . Sexual activity: Yes  Lifestyle  . Physical activity:    Days per week: Not on file    Minutes per session: Not on file  . Stress: Not on file  Relationships  . Social connections:    Talks on phone: Not on file    Gets together: Not  on file    Attends religious service: Not on file    Active member of club or organization: Not on file    Attends meetings of clubs or organizations: Not on file    Relationship status: Not on file  . Intimate partner violence:    Fear of current or ex partner: Not on file    Emotionally abused: Not on file    Physically abused: Not on file    Forced sexual activity: Not on file  Other Topics Concern  . 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    She dances for physical activity, hanging with girlfriend   She has two grandchildren, one is in college     Current Outpatient Medications:  .   acetaminophen (TYLENOL) 500 MG tablet, Take by mouth., Disp: , Rfl:  .  amLODipine (NORVASC) 10 MG tablet, Take 1 tablet (10 mg total) by mouth daily., Disp: 90 tablet, Rfl: 1 .  aspirin EC 81 MG tablet, Take by mouth., Disp: , Rfl:  .  atorvastatin (LIPITOR) 40 MG tablet, Take 1 tablet (40 mg total) by mouth daily., Disp: 90 tablet, Rfl: 1 .  Cholecalciferol (VITAMIN D3) 2000 UNITS capsule, VITAMIN D3, 2000UNIT (Oral Capsule)  2 po qday for 0 days  Quantity: 60.00;  Refills: 0   Ordered :15-May-2010  Steele Sizer MD;  Started 05-October-2008 Active, Disp: , Rfl:  .  fluticasone (FLONASE) 50 MCG/ACT nasal spray, Place into the nose., Disp: , Rfl:  .  glucose blood test strip, , Disp: , Rfl:  .  loratadine (CLARITIN) 10 MG tablet, Take 1 tablet (10 mg total) by mouth daily., Disp: 30 tablet, Rfl: 0 .  metFORMIN (GLUCOPHAGE) 500 MG tablet, Take 1 tablet (500 mg total) by mouth every evening., Disp: 90 tablet, Rfl: 1 .  telmisartan-hydrochlorothiazide (MICARDIS HCT) 80-25 MG tablet, Take 1 tablet by mouth daily., Disp: 90 tablet, Rfl: 1  Allergies  Allergen Reactions  . Oxycodone Other (See Comments)  . Oxycodone Hcl     insomnia, agitation     ROS  Constitutional: Negative for fever or weight change.  Respiratory: Negative for cough and shortness of breath.   Cardiovascular: Negative for chest pain or palpitations.  Gastrointestinal: Negative for abdominal pain, no bowel changes.  Musculoskeletal: Negative for gait problem or joint swelling.  Skin: Negative for rash.  Neurological: Negative for dizziness or headache.  No other specific complaints in a complete review of systems (except as listed in HPI above).  Objective  Vitals:   10/14/17 0744  BP: 120/70  Pulse: (!) 104  Resp: 16  SpO2: 98%  Weight: 208 lb (94.3 kg)  Height: 5\' 1"  (1.549 m)    Body mass index is 39.3 kg/m.  Physical Exam  Constitutional: Patient appears well-developed and well-nourished. Obese  No  distress.  HEENT: head atraumatic, normocephalic, pupils equal and reactive to light, neck supple, throat within normal limits Cardiovascular: Normal rate, regular rhythm and normal heart sounds.  No murmur heard. Trace  BLE edema. Pulmonary/Chest: Effort normal and breath sounds normal. No respiratory distress. Abdominal: Soft.  There is no tenderness. Psychiatric: Patient has a normal mood and affect. behavior is normal. Judgment and thought content normal.  Recent Results (from the past 2160 hour(s))  POCT HgB A1C     Status: Abnormal   Collection Time: 10/14/17  8:18 AM  Result Value Ref  Range   Hemoglobin A1C 6.1 (A) 4.0 - 5.6 %   HbA1c, POC (prediabetic range)  5.7 - 6.4 %   HbA1c, POC (controlled diabetic range)  0.0 - 7.0 %     PHQ2/9: Depression screen [facility] 2/9 04/22/2017 03/04/2017 12/04/2016 05/27/2016 01/22/2016  Decreased Interest 0 0 0 0 0  Down, Depressed, Hopeless 0 0 0 0 0  PHQ - 2 Score 0 0 0 0 0     Fall Risk: Fall Risk  10/14/2017 06/09/2017 04/22/2017 03/04/2017 12/04/2016  Falls in the past year? No No No No No     Functional Status Survey: Is the patient deaf or have difficulty hearing?: No Does the patient have difficulty seeing, even when wearing glasses/contacts?: No Does the patient have difficulty concentrating, remembering, or making decisions?: No Does the patient have difficulty walking or climbing stairs?: No Does the patient have difficulty dressing or bathing?: No Does the patient have difficulty doing errands alone such as visiting a doctor's office or shopping?: No    Assessment & Plan  1. Controlled type 2 diabetes mellitus with stage 2 chronic kidney disease, without long-term current use of insulin (HCC)  - POCT HgB A1C - telmisartan-hydrochlorothiazide (MICARDIS HCT) 80-25 MG tablet; Take 1 tablet by mouth daily.  Dispense: 90 tablet; Refill: 1 - metFORMIN (GLUCOPHAGE) 500 MG tablet; Take 1 tablet (500 mg total) by mouth every evening.   Dispense: 90 tablet; Refill: 1  2. Benign hypertension  - amLODipine (NORVASC) 10 MG tablet; Take 1 tablet (10 mg total) by mouth daily.  Dispense: 90 tablet; Refill: 1 - telmisartan-hydrochlorothiazide (MICARDIS HCT) 80-25 MG tablet; Take 1 tablet by mouth daily.  Dispense: 90 tablet; Refill: 1 - COMPLETE METABOLIC PANEL WITH GFR - CBC with Differential/Platelet  3. Dyslipidemia  - atorvastatin (LIPITOR) 40 MG tablet; Take 1 tablet (40 mg total) by mouth daily.  Dispense: 90 tablet; Refill: 1 - Lipid panel  4. Morbid obesity, unspecified obesity type [facility])  Discussed with the patient the risk posed by an increased BMI. Discussed importance of portion control, calorie counting and at least 150 minutes of physical activity weekly. Avoid sweet beverages and drink more water. Eat at least 6 servings of fruit and vegetables daily   5. Vitamin D deficiency  Continue supplementation   6. Primary osteoarthritis of both knees  Doing well at this time  7. Gastro-esophageal reflux disease without esophagitis  Under control at this time

## 2017-11-25 DIAGNOSIS — H2513 Age-related nuclear cataract, bilateral: Secondary | ICD-10-CM | POA: Diagnosis not present

## 2017-11-25 DIAGNOSIS — H5203 Hypermetropia, bilateral: Secondary | ICD-10-CM | POA: Diagnosis not present

## 2017-11-25 DIAGNOSIS — H269 Unspecified cataract: Secondary | ICD-10-CM

## 2017-11-25 DIAGNOSIS — E119 Type 2 diabetes mellitus without complications: Secondary | ICD-10-CM | POA: Diagnosis not present

## 2017-11-25 DIAGNOSIS — H52223 Regular astigmatism, bilateral: Secondary | ICD-10-CM | POA: Diagnosis not present

## 2017-11-25 DIAGNOSIS — H25013 Cortical age-related cataract, bilateral: Secondary | ICD-10-CM | POA: Diagnosis not present

## 2017-11-25 HISTORY — DX: Hypermetropia, bilateral: H52.03

## 2017-11-25 HISTORY — DX: Unspecified cataract: H26.9

## 2017-11-25 LAB — HM DIABETES EYE EXAM

## 2017-12-03 ENCOUNTER — Other Ambulatory Visit: Payer: Self-pay | Admitting: Family Medicine

## 2017-12-03 DIAGNOSIS — Z1231 Encounter for screening mammogram for malignant neoplasm of breast: Secondary | ICD-10-CM

## 2017-12-15 ENCOUNTER — Encounter: Payer: Self-pay | Admitting: Family Medicine

## 2017-12-23 ENCOUNTER — Ambulatory Visit
Admission: RE | Admit: 2017-12-23 | Discharge: 2017-12-23 | Disposition: A | Discharge status: Home / Self Care | Payer: Medicare Other | Source: Ambulatory Visit | Attending: Family Medicine | Admitting: Family Medicine

## 2017-12-23 DIAGNOSIS — Z1231 Encounter for screening mammogram for malignant neoplasm of breast: Secondary | ICD-10-CM

## 2018-02-12 ENCOUNTER — Ambulatory Visit (INDEPENDENT_AMBULATORY_CARE_PROVIDER_SITE_OTHER): Payer: Medicare Other | Admitting: Family Medicine

## 2018-02-12 ENCOUNTER — Encounter: Payer: Self-pay | Admitting: Family Medicine

## 2018-02-12 VITALS — BP 118/78 | HR 86 | Temp 98.1°F | Resp 16 | Ht 61.0 in | Wt 213.2 lb

## 2018-02-12 DIAGNOSIS — K219 Gastro-esophageal reflux disease without esophagitis: Secondary | ICD-10-CM

## 2018-02-12 DIAGNOSIS — N182 Chronic kidney disease, stage 2 (mild): Secondary | ICD-10-CM

## 2018-02-12 DIAGNOSIS — E559 Vitamin D deficiency, unspecified: Secondary | ICD-10-CM

## 2018-02-12 DIAGNOSIS — M170 Bilateral primary osteoarthritis of knee: Secondary | ICD-10-CM

## 2018-02-12 DIAGNOSIS — I10 Essential (primary) hypertension: Secondary | ICD-10-CM

## 2018-02-12 DIAGNOSIS — E1122 Type 2 diabetes mellitus with diabetic chronic kidney disease: Secondary | ICD-10-CM | POA: Diagnosis not present

## 2018-02-12 DIAGNOSIS — E785 Hyperlipidemia, unspecified: Secondary | ICD-10-CM | POA: Diagnosis not present

## 2018-02-12 LAB — POCT GLYCOSYLATED HEMOGLOBIN (HGB A1C): HbA1c, POC (prediabetic range): 6.1 % (ref 5.7–6.4)

## 2018-02-12 NOTE — Progress Notes (Signed)
Name: Yvonne Curtis   MRN: [id number]    DOB: 1950-05-28   Date:02/12/2018       Progress Note  Subjective  Chief Complaint  Chief Complaint  Patient presents with  . Follow-up    HPI  DMII with renal manifestation: she has been taking Metformin daily and denies side effects,she does not have a blood sugar meter but hgbA1C has been well controlled and not symptoms of hypoglycemia.She denies polyphagia, polyuria or polydipsia. Lasturine micronormal, we will recheck it today. She has a history of microalbuminuria that resolved with ARB - denies side effects.Eye exam is up to date. HgbA1C today was 6.1%  HTN: she has been taking her medications as prescribed and denies side effects. Denies orthostatic changes, palpitation  or chest pain. We will continue current medication   Hyperlipidemia: Taking Lipitor and denies side effects. No myalgia.Lipid panel reviewed and at goal   Obesity: She has been using her stationary bike for 15 minutes about 3-4 times a week,walks outside sometimes, but really likes to dance at home, she has been splurging on her diet more often and has gained 5 lbs since last visit.   OA knee: she states her knees no longer bothering her since she stopped working. She dances at home for physical activity, she has also been using stationary bicycle for about 15 minutes a few times a week    Patient Active Problem List   Diagnosis Date Noted  . Acanthosis nigricans 11/14/2014  . Benign hypertension 11/14/2014  . Dyslipidemia 11/14/2014  . Gastro-esophageal reflux disease without esophagitis 11/14/2014  . Extreme obesity 11/14/2014  . Primary osteoarthritis of both knees 11/14/2014  . Microalbuminuria 10/15/2012  . Controlled type 2 diabetes mellitus with microalbuminuria ([place]) 06/05/2009  . Body dermatophytosis 12/13/2008    Past Surgical History:  Procedure Laterality Date  . COLONOSCOPY WITH PROPOFOL N/A 11/30/2014   Procedure: COLONOSCOPY WITH  PROPOFOL;  Surgeon: Lucilla Lame, MD;  Location: [facility];  Service: Endoscopy;  Laterality: N/A;  WITH BIOPSY-- TRANSVERSE COLON POLYP  X 2 DESCENING COLON POLYP  . GALLBLADDER SURGERY    . SEPTOPLASTY    . tonsillectomy    . TUBAL LIGATION      Family History  Problem Relation Age of Onset  . Colon cancer Mother   . Lung cancer Father   . Hypertension Sister   . Hypertension Sister     Social History   Socioeconomic History  . Marital status: Married    Spouse name: [name]   . Number of children: 2  . Years of education: Not on file  . Highest education level: 12th grade  Occupational History  . Not on file  Social Needs  . Financial resource strain: Not hard at all  . Food insecurity:    Worry: Never true    Inability: Never true  . Transportation needs:    Medical: No    Non-medical: No  Tobacco Use  . Smoking status: Never Smoker  . Smokeless tobacco: Never Used  Substance and Sexual Activity  . Alcohol use: No    Alcohol/week: 0.0 standard drinks  . Drug use: No  . Sexual activity: Yes  Lifestyle  . Physical activity:    Days per week: 5 days    Minutes per session: 20 min  . Stress: Not at all  Relationships  . Social connections:    Talks on phone: More than three times a week    Gets together: More than three times  a week    Attends religious service: 1 to 4 times per year    Active member of club or organization: Yes    Attends meetings of clubs or organizations: More than 4 times per year    Relationship status: Married  . Intimate partner violence:    Fear of current or ex partner: No    Emotionally abused: No    Physically abused: No    Forced sexual activity: No  Other Topics Concern  . 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    She dances for physical activity   She has two grandchildren, one is in college     Current Outpatient  Medications:  .  acetaminophen (TYLENOL) 500 MG tablet, Take by mouth., Disp: , Rfl:  .  amLODipine (NORVASC) 10 MG tablet, Take 1 tablet (10 mg total) by mouth daily., Disp: 90 tablet, Rfl: 1 .  aspirin EC 81 MG tablet, Take by mouth., Disp: , Rfl:  .  atorvastatin (LIPITOR) 40 MG tablet, Take 1 tablet (40 mg total) by mouth daily., Disp: 90 tablet, Rfl: 1 .  Cholecalciferol (VITAMIN D3) 2000 UNITS capsule, VITAMIN D3, 2000UNIT (Oral Capsule)  2 po qday for 0 days  Quantity: 60.00;  Refills: 0   Ordered :15-May-2010  Steele Sizer MD;  Started 05-October-2008 Active, Disp: , Rfl:  .  fluticasone (FLONASE) 50 MCG/ACT nasal spray, Place into the nose., Disp: , Rfl:  .  glucose blood test strip, , Disp: , Rfl:  .  loratadine (CLARITIN) 10 MG tablet, Take 1 tablet (10 mg total) by mouth daily., Disp: 30 tablet, Rfl: 0 .  metFORMIN (GLUCOPHAGE) 500 MG tablet, Take 1 tablet (500 mg total) by mouth every evening., Disp: 90 tablet, Rfl: 1 .  telmisartan-hydrochlorothiazide (MICARDIS HCT) 80-25 MG tablet, Take 1 tablet by mouth daily., Disp: 90 tablet, Rfl: 1  Allergies  Allergen Reactions  . Oxycodone Other (See Comments)  . Oxycodone Hcl     insomnia, agitation    I personally reviewed active problem list, medication list, allergies, family history, social history with the patient/caregiver today.   ROS  Constitutional: Negative for fever or weight change.  Respiratory: Negative for cough and shortness of breath.   Cardiovascular: Negative for chest pain or palpitations.  Gastrointestinal: Negative for abdominal pain, no bowel changes.  Musculoskeletal: Negative for gait problem or joint swelling.  Skin: Negative for rash.  Neurological: Negative for dizziness or headache.  No other specific complaints in a complete review of systems (except as listed in HPI above).   Objective  Vitals:   02/12/18 0752  BP: 118/78  Pulse: 86  Resp: 16  Temp: 98.1 F (36.7 C)  TempSrc: Oral  SpO2:  98%  Weight: 213 lb 3.2 oz (96.7 kg)  Height: 5\' 1"  (1.549 m)    Body mass index is 40.28 kg/m.  Physical Exam  Constitutional: Patient appears well-developed and well-nourished. Obese  No distress.  HEENT: head atraumatic, normocephalic, pupils equal and reactive to light,neck supple, throat within normal limits Cardiovascular: Normal rate, regular rhythm and normal heart sounds.  No murmur heard. No BLE edema. Pulmonary/Chest: Effort normal and breath sounds normal. No respiratory distress. Abdominal: Soft.  There is no tenderness. Psychiatric: Patient has a normal mood and affect. behavior is normal. Judgment and thought content normal.  Recent Results (from the past 2160 hour(s))  HM DIABETES EYE  EXAM     Status: None   Collection Time: 11/25/17 12:00 AM  Result Value Ref Range   HM Diabetic Eye Exam No Retinopathy No Retinopathy    Comment: Also no Macular Edema  - Lanny Hurst B. [place]    POCT HgB A1C     Status: None   Collection Time: 02/12/18  8:10 AM  Result Value Ref Range   Hemoglobin A1C     HbA1c POC (<> result, manual entry)     HbA1c, POC (prediabetic range) 6.1 5.7 - 6.4 %   HbA1c, POC (controlled diabetic range)      Diabetic Foot Exam: Diabetic Foot Exam - Simple   Simple Foot Form Diabetic Foot exam was performed with the following findings:  Yes 02/12/2018  8:44 AM  Visual Inspection No deformities, no ulcerations, no other skin breakdown bilaterally:  Yes Sensation Testing Intact to touch and monofilament testing bilaterally:  Yes Pulse Check Posterior Tibialis and Dorsalis pulse intact bilaterally:  Yes Comments      PHQ2/9: Depression screen [facility] 2/9 02/12/2018 04/22/2017 03/04/2017 12/04/2016 05/27/2016  Decreased Interest 0 0 0 0 0  Down, Depressed, Hopeless 0 0 0 0 0  PHQ - 2 Score 0 0 0 0 0  Altered sleeping 0 - - - -  Tired, decreased energy 0 - - - -  Change in appetite 0 - - - -  Feeling bad or failure about yourself  0 - - - -  Trouble  concentrating 0 - - - -  Moving slowly or fidgety/restless 0 - - - -  Suicidal thoughts 0 - - - -  Difficult doing work/chores Not difficult at all - - - -     Fall Risk: Fall Risk  02/12/2018 10/14/2017 06/09/2017 04/22/2017 03/04/2017  Falls in the past year? No No No No No     Assessment & Plan   1. Controlled type 2 diabetes mellitus with stage 2 chronic kidney disease, without long-term current use of insulin (HCC)  - POCT HgB A1C  2. Dyslipidemia  Continue statin therapy   3. Benign hypertension  At goal, continue medication   4. Gastro-esophageal reflux disease without esophagitis  Resolved  5. Primary osteoarthritis of both knees  Continue exercise  6. Vitamin D deficiency  Continue vitamin D supplementation   7. Morbid obesity ([place])  Discussed importance of losing weight again

## 2018-03-09 ENCOUNTER — Other Ambulatory Visit: Payer: Self-pay | Admitting: Family Medicine

## 2018-03-09 ENCOUNTER — Telehealth: Payer: Self-pay | Admitting: Family Medicine

## 2018-03-09 ENCOUNTER — Ambulatory Visit (INDEPENDENT_AMBULATORY_CARE_PROVIDER_SITE_OTHER): Payer: Medicare Other

## 2018-03-09 VITALS — BP 138/82 | HR 76 | Temp 97.8°F | Resp 16 | Ht 60.0 in | Wt 213.9 lb

## 2018-03-09 DIAGNOSIS — Z0000 Encounter for general adult medical examination without abnormal findings: Secondary | ICD-10-CM

## 2018-03-09 NOTE — Progress Notes (Signed)
Subjective:   Yvonne Curtis is a 67 y.o. female who presents for Medicare Annual (Subsequent) preventive examination.  Review of Systems:   Cardiac Risk Factors include: advanced age (>27men, >45 women);diabetes mellitus;hypertension;dyslipidemia;obesity (BMI >30kg/m2)     Objective:     Vitals: BP 138/82 (BP Location: Right Arm, Patient Position: Sitting, Cuff Size: Large)   Pulse 76   Temp 97.8 F (36.6 C)   Resp 16   Ht 5' (1.524 m)   Wt 213 lb 14.4 oz (97 kg)   BMI 41.77 kg/m   Body mass index is 41.77 kg/m.  Advanced Directives 03/09/2018 03/04/2017 03/04/2017 12/04/2016 05/27/2016 01/22/2016 09/20/2015  Does Patient Have a Medical Advance Directive? Yes Yes Yes Yes No No No  Type of Paramedic of [place];Living will Healthcare Power of Attorney Living will Living will;Healthcare Power of Attorney - - -  Does patient want to make changes to medical advance directive? No - Patient declined Yes (Inpatient - patient defers changing a medical advance directive at this time) Yes (Inpatient - patient defers changing a medical advance directive at this time) - - - -  Copy of [place] in Chart? Yes Yes - Yes - - -  Would patient like information on creating a medical advance directive? - - - - - No - patient declined information No - patient declined information    Tobacco Social History   Tobacco Use  Smoking Status Never Smoker  Smokeless Tobacco Never Used     Counseling given: Not Answered   Clinical Intake:  Pre-visit preparation completed: Yes  Pain : No/denies pain    Nutrition Risk Assessment:  Has the patient had any N/V/D within the last 2 months?  No  Does the patient have any non-healing wounds?  No  Has the patient had any unintentional weight loss or weight gain?  No   Diabetes:  Is the patient diabetic?  Yes  If diabetic, was a CBG obtained today?  No  Did the patient bring in their glucometer from  home?  No  How often do you monitor your CBG's? Patient needs new glucometer and testing supplies, was previously checking blood sugar every other day.   Financial Strains and Diabetes Management:  Are you having any financial strains with the device, your supplies or your medication? No .  Does the patient want to be seen by Chronic Care Management for management of their diabetes?  No  Would the patient like to be referred to a Nutritionist or for Diabetic Management?  No   Diabetic Exams:  Diabetic Eye Exam: Completed 11/25/17.   Diabetic Foot Exam: Completed 02/12/18.   Nutritional Status: BMI > 30  Obese Nutritional Risks: None Diabetes: Yes CBG done?: No CBG resulted in Enter/ Edit results?: No Did pt. bring in CBG monitor from home?: No(pt states she lost her meter and needs new rx for meter and supplies)  How often do you need to have someone help you when you read instructions, pamphlets, or other written materials from your doctor or pharmacy?: 1 - Never What is the last grade level you completed in school?: 12th grade  Interpreter Needed?: No  Information entered by :: Clemetine Marker LPN  Past Medical History:  Diagnosis Date  . Arthritis    knees  . Cataract 11/25/2017   Nuclear - OU  . Diabetes mellitus without complication ([place])   . Hyperlipidemia   . Hyperopia, bilateral 11/25/2017   [place].  [place]  . Hypertension   . Obesity   . Patient is Jehovah's Witness    declines blood products  . Wears dentures    full upper   Past Surgical History:  Procedure Laterality Date  . COLONOSCOPY WITH PROPOFOL N/A 11/30/2014   Procedure: COLONOSCOPY WITH PROPOFOL;  Surgeon: Lucilla Lame, MD;  Location: [facility];  Service: Endoscopy;  Laterality: N/A;  WITH BIOPSY-- TRANSVERSE COLON POLYP  X 2 DESCENING COLON POLYP  . GALLBLADDER SURGERY    . SEPTOPLASTY    . tonsillectomy    . TUBAL LIGATION     Family History  Problem Relation Age of Onset  . Colon  cancer Mother   . Lung cancer Father   . Hypertension Sister   . Hypertension Sister    Social History   Socioeconomic History  . Marital status: Married    Spouse name: [name]   . Number of children: 2  . Years of education: Not on file  . Highest education level: 12th grade  Occupational History  . Occupation: retired  Scientific laboratory technician  . Financial resource strain: Not hard at all  . Food insecurity:    Worry: Never true    Inability: Never true  . Transportation needs:    Medical: No    Non-medical: No  Tobacco Use  . Smoking status: Never Smoker  . Smokeless tobacco: Never Used  Substance and Sexual Activity  . Alcohol use: No    Alcohol/week: 0.0 standard drinks  . Drug use: No  . Sexual activity: Yes  Lifestyle  . Physical activity:    Days per week: 3 days    Minutes per session: 20 min  . Stress: Not at all  Relationships  . Social connections:    Talks on phone: More than three times a week    Gets together: More than three times a week    Attends religious service: More than 4 times per year    Active member of club or organization: Yes    Attends meetings of clubs or organizations: More than 4 times per year    Relationship status: Married  Other Topics Concern  . 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    She dances for physical activity   She has two grandchildren, one is in college    Outpatient Encounter Medications as of 03/09/2018  Medication Sig  . acetaminophen (TYLENOL) 500 MG tablet Take by mouth.  Marland Kitchen amLODipine (NORVASC) 10 MG tablet Take 1 tablet (10 mg total) by mouth daily.  Marland Kitchen aspirin EC 81 MG tablet Take by mouth.  Marland Kitchen atorvastatin (LIPITOR) 40 MG tablet Take 1 tablet (40 mg total) by mouth daily.  . Cholecalciferol (VITAMIN D3) 2000 UNITS capsule VITAMIN D3, 2000UNIT (Oral Capsule)  2 po qday for 0 days  Quantity: 60.00;  Refills: 0   Ordered  :15-May-2010  Steele Sizer MD;  [place] 05-October-2008 Active  . fluticasone (FLONASE) 50 MCG/ACT nasal spray Place into the nose.  Marland Kitchen glucose blood test strip   . loratadine (CLARITIN) 10 MG tablet Take 1 tablet (10 mg total) by mouth daily.  . metFORMIN (GLUCOPHAGE) 500 MG tablet Take 1 tablet (500 mg total) by mouth every evening.  Marland Kitchen telmisartan-hydrochlorothiazide (MICARDIS HCT) 80-25 MG tablet Take 1 tablet by mouth daily.   No facility-administered encounter medications on file as of 03/09/2018.  Activities of Daily Living In your present state of health, do you have any difficulty performing the following activities: 03/09/2018 10/14/2017  Hearing? N N  Comment declines hearing aids -  Vision? N N  Comment wears reading glasses -  Difficulty concentrating or making decisions? N N  Walking or climbing stairs? Y N  Comment occaisonal soreness in knees when climbing stairs -  Dressing or bathing? N N  Doing errands, shopping? N N  Preparing Food and eating ? N -  Using the Toilet? N -  In the past six months, have you accidently leaked urine? N -  Do you have problems with loss of bowel control? N -  Managing your Medications? N -  Managing your Finances? N -  Housekeeping or managing your Housekeeping? N -  Some recent data might be hidden    Patient Care Team: Steele Sizer, MD as PCP - General (Family Medicine) Idelle Leech, OD as Consulting Physician (Optometry) Mardene Celeste., MD as Consulting Physician (Dentistry) Lucilla Lame, MD as Consulting Physician (Gastroenterology) Yolonda Kida, MD as Consulting Physician (Cardiology)    Assessment:   This is a routine wellness examination for [place].  Exercise Activities and Dietary recommendations Current Exercise Habits: Home exercise routine, Type of exercise: Other - see comments(chair dance at home and stationary bike), Time (Minutes): 20, Frequency (Times/Week): 4, Weekly Exercise (Minutes/Week): 80,  Intensity: Mild, Exercise limited by: None identified  Goals    . DIET - INCREASE WATER INTAKE     Recommend drinking 6-8 glasses of water per day        Fall Risk Fall Risk  03/09/2018 02/12/2018 10/14/2017 06/09/2017 04/22/2017  Falls in the past year? 0 No No No No   FALL RISK PREVENTION PERTAINING TO THE HOME:  Any stairs in or around the home WITH handrails? Yes  Home free of loose throw rugs in walkways, pet beds, electrical cords, etc? Yes  Adequate lighting in your home to reduce risk of falls? Yes   ASSISTIVE DEVICES UTILIZED TO PREVENT FALLS:  Life alert? No  Use of a cane, walker or w/c? Yes  Grab bars in the bathroom? No  Shower chair or bench in shower? No  Elevated toilet seat or a handicapped toilet? No   DME ORDERS:  DME order needed?  No   TIMED UP AND GO:  Was the test performed? Yes .  Length of time to ambulate 10 feet: 6 sec.   GAIT:  Appearance of gait: Gait stead-fast and without the use of an assistive device. Education: Fall risk prevention has been discussed.  Intervention(s) required? No   Depression Screen PHQ 2/9 Scores 03/09/2018 02/12/2018 04/22/2017 03/04/2017  PHQ - 2 Score 0 0 0 0     Cognitive Function     6CIT Screen 03/09/2018  What Year? 0 points  What month? 0 points  What time? 0 points  Count back from 20 0 points  Months in reverse 0 points  Repeat phrase 0 points  Total Score 0    Immunization History  Administered Date(s) Administered  . Influenza Nasal 02/12/2018  . Influenza Split 02/23/2008  . Influenza-Unspecified 01/25/2014, 01/23/2015, 01/30/2016, 02/06/2017, 01/28/2018  . Pneumococcal Conjugate-13 01/17/2015  . Pneumococcal Polysaccharide-23 06/26/2010, 02/20/2016  . Tdap 06/26/2010  . Zoster 03/06/2011    Qualifies for Shingles Vaccine?Yes  Zostavax completed 03/06/2011. Due for Shingrix. Education has been provided regarding the importance of this vaccine. Pt has been advised to call insurance company  to determine out of pocket expense. Advised may also receive vaccine at local pharmacy or Health Dept. Verbalized acceptance and understanding.  Tdap: Up to date  Flu Vaccine: Up to date  Pneumococcal Vaccine: Up to date   Screening Tests Health Maintenance  Topic Date Due  . HEMOGLOBIN A1C  08/14/2018  . OPHTHALMOLOGY EXAM  11/26/2018  . FOOT EXAM  02/13/2019  . COLONOSCOPY  11/30/2019  . MAMMOGRAM  12/24/2019  . TETANUS/TDAP  06/26/2020  . INFLUENZA VACCINE  Completed  . DEXA SCAN  Completed  . Hepatitis C Screening  Completed  . PNA vac Low Risk Adult  Completed    Cancer Screenings:  Colorectal Screening: Completed 11/30/14. Repeat every 5 years;   Mammogram: Completed 12/23/17.   Bone Density: Completed 06/01/17. Results reflect NORMAL, OSTEOPENIA, OSTEOPOROSIS.   Lung Cancer Screening: (Low Dose CT Chest recommended if Age 75-80 years, 30 pack-year currently smoking OR have quit w/in 15years.) does not qualify.    Additional Screening:  Hepatitis C Screening: does qualify; Completed 01/30/12  Vision Screening: Recommended annual ophthalmology exams for early detection of glaucoma and other disorders of the eye. Is the patient up to date with their annual eye exam?  Yes  Who is the provider or what is the name of the office in which the pt attends annual eye exams? Dr. Matilde Sprang   Dental Screening: Recommended annual dental exams for proper oral hygiene  Community Resource Referral:  CRR required this visit?  No      Plan:    I have personally reviewed and addressed the Medicare Annual Wellness questionnaire and have noted the following in the patient's chart:  A. Medical and social history B. Use of alcohol, tobacco or illicit drugs  C. Current medications and supplements D. Functional ability and status E.  Nutritional status F.  Physical activity G. Advance directives H. List of other physicians I.  Hospitalizations, surgeries, and ER visits in previous 12  months J.  [place] such as hearing and vision if needed, cognitive and depression L. Referrals and appointments   In addition, I have reviewed and discussed with patient certain preventive protocols, quality metrics, and best practice recommendations. A written personalized care plan for preventive services as well as general preventive health recommendations were provided to patient.   Signed,  Clemetine Marker, LPN Nurse Health Advisor   Nurse Notes: Pt needs prescription for new glucometer and diabetic testing supplies in order to check blood sugar. Message sent to Dr. Ancil Boozer.

## 2018-03-09 NOTE — Telephone Encounter (Signed)
error 

## 2018-03-09 NOTE — Telephone Encounter (Signed)
Patient seen in office today for medicare annual wellness visit. States she has misplaced her glucometer and not currently checking her blood sugar. Pt has appt 06/16/18 for f/u for DM, HTN & CPE. Pt requesting rx for glucometer and testing supplies with testing instructions sent to [facility]. She did not recall what type of meter she has. Recommend accu-chek guide due to [place] advantage plan. Please advise. Thank you!

## 2018-03-09 NOTE — Patient Instructions (Signed)
Yvonne Curtis , Thank you for taking time to come for your Medicare Wellness Visit. I appreciate your ongoing commitment to your health goals. Please review the following plan we discussed and let me know if I can assist you in the future.   Screening recommendations/referrals: Colonoscopy: Up to date Mammogram: Up to date Bone Density: Up to date Recommended yearly ophthalmology/optometry visit for glaucoma screening and checkup Recommended yearly dental visit for hygiene and checkup  Vaccinations: Influenza vaccine: Up to date Pneumococcal vaccine: Up to date Tdap vaccine: Up to date Shingles vaccine: Shingrix discussed     Conditions/risks identified: recommend drinking 6-8 glasses of water per day  Next appointment: 06/16/2018   Preventive Care 68 Years and Older, Female Preventive care refers to lifestyle choices and visits with your health care provider that can promote health and wellness. What does preventive care include?  A yearly physical exam. This is also called an annual well check.  Dental exams once or twice a year.  Routine eye exams. Ask your health care provider how often you should have your eyes checked.  Personal lifestyle choices, including:  Daily care of your teeth and gums.  Regular physical activity.  Eating a healthy diet.  Avoiding tobacco and drug use.  Limiting alcohol use.  Practicing safe sex.  Taking low-dose aspirin every day.  Taking vitamin and mineral supplements as recommended by your health care provider. What happens during an annual well check? The services and screenings done by your health care provider during your annual well check will depend on your age, overall health, lifestyle risk factors, and family history of disease. Counseling  Your health care provider may ask you questions about your:  Alcohol use.  Tobacco use.  Drug use.  Emotional well-being.  Home and relationship well-being.  Sexual  activity.  Eating habits.  History of falls.  Memory and ability to understand (cognition).  Work and work Statistician.  Reproductive health. Screening  You may have the following tests or measurements:  Height, weight, and BMI.  Blood pressure.  Lipid and cholesterol levels. These may be checked every 5 years, or more frequently if you are over 91 years old.  Skin check.  Lung cancer screening. You may have this screening every year starting at age 42 if you have a 30-pack-year history of smoking and currently smoke or have quit within the past 15 years.  Fecal occult blood test (FOBT) of the stool. You may have this test every year starting at age 16.  Flexible sigmoidoscopy or colonoscopy. You may have a sigmoidoscopy every 5 years or a colonoscopy every 10 years starting at age 89.  Hepatitis C blood test.  Hepatitis B blood test.  Sexually transmitted disease (STD) testing.  Diabetes screening. This is done by checking your blood sugar (glucose) after you have not eaten for a while (fasting). You may have this done every 1-3 years.  Bone density scan. This is done to screen for osteoporosis. You may have this done starting at age 26.  Mammogram. This may be done every 1-2 years. Talk to your health care provider about how often you should have regular mammograms. Talk with your health care provider about your test results, treatment options, and if necessary, the need for more tests. Vaccines  Your health care provider may recommend certain vaccines, such as:  Influenza vaccine. This is recommended every year.  Tetanus, diphtheria, and acellular pertussis (Tdap, Td) vaccine. You may need a Td booster every 10 years.  Zoster vaccine. You may need this after age 64.  Pneumococcal 13-valent conjugate (PCV13) vaccine. One dose is recommended after age 59.  Pneumococcal polysaccharide (PPSV23) vaccine. One dose is recommended after age 55. Talk to your health care  provider about which screenings and vaccines you need and how often you need them. This information is not intended to replace advice given to you by your health care provider. Make sure you discuss any questions you have with your health care provider. Document Released: 05/18/2015 Document Revised: 01/09/2016 Document Reviewed: 02/20/2015 Elsevier Interactive Patient Education  2017 [place] Prevention in the Home Falls can cause injuries. They can happen to people of all ages. There are many things you can do to make your home safe and to help prevent falls. What can I do on the outside of my home?  Regularly fix the edges of walkways and driveways and fix any cracks.  Remove anything that might make you trip as you walk through a door, such as a raised step or threshold.  Trim any bushes or trees on the path to your home.  Use bright outdoor lighting.  Clear any walking paths of anything that might make someone trip, such as rocks or tools.  Regularly check to see if handrails are loose or broken. Make sure that both sides of any steps have handrails.  Any raised decks and porches should have guardrails on the edges.  Have any leaves, snow, or ice cleared regularly.  Use sand or salt on walking paths during winter.  Clean up any spills in your garage right away. This includes oil or grease spills. What can I do in the bathroom?  Use night lights.  Install grab bars by the toilet and in the tub and shower. Do not use towel bars as grab bars.  Use non-skid mats or decals in the tub or shower.  If you need to sit down in the shower, use a plastic, non-slip stool.  Keep the floor dry. Clean up any water that spills on the floor as soon as it happens.  Remove soap buildup in the tub or shower regularly.  Attach bath mats securely with double-sided non-slip rug tape.  Do not have throw rugs and other things on the floor that can make you trip. What can I do in  the bedroom?  Use night lights.  Make sure that you have a light by your bed that is easy to reach.  Do not use any sheets or blankets that are too big for your bed. They should not hang down onto the floor.  Have a firm chair that has side arms. You can use this for support while you get dressed.  Do not have throw rugs and other things on the floor that can make you trip. What can I do in the kitchen?  Clean up any spills right away.  Avoid walking on wet floors.  Keep items that you use a lot in easy-to-reach places.  If you need to reach something above you, use a strong step stool that has a grab bar.  Keep electrical cords out of the way.  Do not use floor polish or wax that makes floors slippery. If you must use wax, use non-skid floor wax.  Do not have throw rugs and other things on the floor that can make you trip. What can I do with my stairs?  Do not leave any items on the stairs.  Make sure that there are  handrails on both sides of the stairs and use them. Fix handrails that are broken or loose. Make sure that handrails are as long as the stairways.  Check any carpeting to make sure that it is firmly attached to the stairs. Fix any carpet that is loose or worn.  Avoid having throw rugs at the top or bottom of the stairs. If you do have throw rugs, attach them to the floor with carpet tape.  Make sure that you have a light switch at the top of the stairs and the bottom of the stairs. If you do not have them, ask someone to add them for you. What else can I do to help prevent falls?  Wear shoes that:  Do not have high heels.  Have rubber bottoms.  Are comfortable and fit you well.  Are closed at the toe. Do not wear sandals.  If you use a stepladder:  Make sure that it is fully opened. Do not climb a closed stepladder.  Make sure that both sides of the stepladder are locked into place.  Ask someone to hold it for you, if possible.  Clearly mark and  make sure that you can see:  Any grab bars or handrails.  First and last steps.  Where the edge of each step is.  Use tools that help you move around (mobility aids) if they are needed. These include:  Canes.  Walkers.  Scooters.  Crutches.  Turn on the lights when you go into a dark area. Replace any light bulbs as soon as they burn out.  Set up your furniture so you have a clear path. Avoid moving your furniture around.  If any of your floors are uneven, fix them.  If there are any pets around you, be aware of where they are.  Review your medicines with your doctor. Some medicines can make you feel dizzy. This can increase your chance of falling. Ask your doctor what other things that you can do to help prevent falls. This information is not intended to replace advice given to you by your health care provider. Make sure you discuss any questions you have with your health care provider. Document Released: 02/15/2009 Document Revised: 09/27/2015 Document Reviewed: 05/26/2014 Elsevier Interactive Patient Education  2017 [employer].

## 2018-05-27 DIAGNOSIS — H2513 Age-related nuclear cataract, bilateral: Secondary | ICD-10-CM | POA: Diagnosis not present

## 2018-05-27 DIAGNOSIS — E119 Type 2 diabetes mellitus without complications: Secondary | ICD-10-CM | POA: Diagnosis not present

## 2018-05-27 DIAGNOSIS — H25013 Cortical age-related cataract, bilateral: Secondary | ICD-10-CM | POA: Diagnosis not present

## 2018-06-16 ENCOUNTER — Encounter: Payer: Self-pay | Admitting: Family Medicine

## 2018-06-16 ENCOUNTER — Ambulatory Visit (INDEPENDENT_AMBULATORY_CARE_PROVIDER_SITE_OTHER): Payer: Medicare Other | Admitting: Family Medicine

## 2018-06-16 VITALS — BP 132/68 | HR 90 | Temp 97.3°F | Resp 16 | Ht 59.5 in | Wt 209.7 lb

## 2018-06-16 DIAGNOSIS — M170 Bilateral primary osteoarthritis of knee: Secondary | ICD-10-CM

## 2018-06-16 DIAGNOSIS — E559 Vitamin D deficiency, unspecified: Secondary | ICD-10-CM

## 2018-06-16 DIAGNOSIS — E1122 Type 2 diabetes mellitus with diabetic chronic kidney disease: Secondary | ICD-10-CM

## 2018-06-16 DIAGNOSIS — I10 Essential (primary) hypertension: Secondary | ICD-10-CM

## 2018-06-16 DIAGNOSIS — Z0000 Encounter for general adult medical examination without abnormal findings: Secondary | ICD-10-CM

## 2018-06-16 DIAGNOSIS — E785 Hyperlipidemia, unspecified: Secondary | ICD-10-CM

## 2018-06-16 DIAGNOSIS — N182 Chronic kidney disease, stage 2 (mild): Secondary | ICD-10-CM | POA: Diagnosis not present

## 2018-06-16 DIAGNOSIS — J302 Other seasonal allergic rhinitis: Secondary | ICD-10-CM

## 2018-06-16 LAB — POCT UA - MICROALBUMIN: Microalbumin Ur, POC: 20 mg/L

## 2018-06-16 LAB — POCT GLYCOSYLATED HEMOGLOBIN (HGB A1C): HEMOGLOBIN A1C: 5.9 % — AB (ref 4.0–5.6)

## 2018-06-16 MED ORDER — TELMISARTAN-HCTZ 80-25 MG PO TABS: 1.0000 | ORAL_TABLET | Freq: Every day | ORAL | 1 refills | Status: DC

## 2018-06-16 MED ORDER — METFORMIN HCL 500 MG PO TABS: 500.0000 mg | ORAL_TABLET | Freq: Every evening | ORAL | 1 refills | Status: DC

## 2018-06-16 MED ORDER — FLUTICASONE PROPIONATE 50 MCG/ACT NA SUSP: 2.0000 | Freq: Every day | NASAL | 1 refills | Status: DC

## 2018-06-16 MED ORDER — AMLODIPINE BESYLATE 10 MG PO TABS: 10.0000 mg | ORAL_TABLET | Freq: Every day | ORAL | 1 refills | Status: DC

## 2018-06-16 MED ORDER — ATORVASTATIN CALCIUM 40 MG PO TABS: 40.0000 mg | ORAL_TABLET | Freq: Every day | ORAL | 1 refills | Status: DC

## 2018-06-16 NOTE — Progress Notes (Signed)
Name: Yvonne Curtis   MRN: [id number]    DOB: 08-04-1950   Date:06/16/2018       Progress Note  Subjective  Chief Complaint  Chief Complaint  Patient presents with  . Annual Exam  . Hypertension    Denies any symptoms  . Diabetes  . Medication Refill    HPI   Patient presents for annual CPE and follow up  DMII with renal manifestation: she has been taking Metformin daily and denies side effects, including no diarrhea,she does not have a blood sugar meter but hgbA1C has been well controlled and not symptoms of hypoglycemia.She denies polyphagia, polyuria or polydipsia. Urine micro has been negative lately. She has a history of microalbuminuria that resolved with ARB - denies side effects.Eye exam is up to date. HgbA1C today was 5.9%  HTN: she has been taking her medications as prescribed and denies side effects. Denies orthostatic changes, palpitation or chest pain. Unchanged    Hyperlipidemia: Taking Lipitor and denies side effects. No myalgia.Lipid panel reviewed and at goal . Unchanged  Obesity: She has been using her stationary bike for 15 minutes about 2 times a week,she has not been walking as much, but has decreased portion size and lost 5 lbs since last visit   OA knee: she states her knees no longer bothering her since she stopped working.She dances at home for physical activity. She occasionally uses topical medication otc   Diet: she has been eating smaller portions, avoiding a lot of carbohydrates  Exercise: uses stationary bike twice a week for about 20 minutes, advised to increase to 30 minutes 5 days a week.   USPSTF grade A and B recommendations    Office Visit from 06/16/2018 in [facility]  AUDIT-C Score  0     Depression:  Depression screen [facility] 2/9 06/16/2018 03/09/2018 02/12/2018 04/22/2017 03/04/2017  Decreased Interest 0 0 0 0 0  Down, Depressed, Hopeless 0 0 0 0 0  PHQ - 2 Score 0 0 0 0 0  Altered sleeping - - 0 - -   Tired, decreased energy - - 0 - -  Change in appetite - - 0 - -  Feeling bad or failure about yourself  - - 0 - -  Trouble concentrating - - 0 - -  Moving slowly or fidgety/restless - - 0 - -  Suicidal thoughts - - 0 - -  Difficult doing work/chores - - Not difficult at all - -   Hypertension: BP Readings from Last 3 Encounters:  06/16/18 132/68  03/09/18 138/82  02/12/18 118/78   Obesity: Wt Readings from Last 3 Encounters:  06/16/18 209 lb 11.2 oz (95.1 kg)  03/09/18 213 lb 14.4 oz (97 kg)  02/12/18 213 lb 3.2 oz (96.7 kg)   BMI Readings from Last 3 Encounters:  06/16/18 41.65 kg/m  03/09/18 41.77 kg/m  02/12/18 40.28 kg/m    Hep C Screening: 2013 STD testing and prevention (HIV/chl/gon/syphilis): N/A Intimate partner violence: negative  Sexual History/Pain during Intercourse: no pain  Menstrual History/LMP/Abnormal Bleeding: discussed importance of follow up in case of coming in in case of post-menopausal  Incontinence Symptoms: none   Advanced Care Planning: A voluntary discussion about advance care planning including the explanation and discussion of advance directives.  Discussed health care proxy and Living will, and the patient was able to identify a health care proxy as husband  Patient does have a living will at present time. If patient does have living will, I  have requested they bring this to the clinic to be scanned in to their chart.  Breast cancer:  HM Mammogram  Date Value Ref Range Status  08/10/2013 Normal  Final    BRCA gene screening: mother had colon cancer in her last 1's, not a candidate  Cervical cancer screening: no longer needed   Osteoporosis Screening:   Last one in 2019 and normal   Lipids:  Lab Results  Component Value Date   CHOL 124 10/14/2017   CHOL 127 03/04/2017   CHOL 156 02/15/2016   Lab Results  Component Value Date   HDL 45 (L) 10/14/2017   HDL 44 (L) 03/04/2017   HDL 46 02/15/2016   Lab Results  Component Value  Date   LDLCALC 60 10/14/2017   LDLCALC 62 03/04/2017   LDLCALC 86 02/15/2016   Lab Results  Component Value Date   TRIG 106 10/14/2017   TRIG 124 03/04/2017   TRIG 118 02/15/2016   Lab Results  Component Value Date   CHOLHDL 2.8 10/14/2017   CHOLHDL 2.9 03/04/2017   CHOLHDL 2.8 05/23/2015   No results found for: LDLDIRECT  Glucose:  Glucose  Date Value Ref Range Status  02/15/2016 100 (H) 65 - 99 mg/dL Final   Glucose, Bld  Date Value Ref Range Status  10/14/2017 92 65 - 99 mg/dL Final    Comment:    .            Fasting reference interval .   03/04/2017 91 65 - 99 mg/dL Final    Comment:    .            Fasting reference interval .    Glucose-Capillary  Date Value Ref Range Status  11/30/2014 78 65 - 99 mg/dL Final  11/30/2014 87 65 - 99 mg/dL Final    Skin cancer: discussed atypical lesions Colorectal cancer: repeat 2021   Lung cancer:  Low Dose CT Chest recommended if Age 95-80 years, 30 pack-year currently smoking OR have quit w/in 15years. Patient does not qualify.   [id number]  Patient Active Problem List   Diagnosis Date Noted  . Acanthosis nigricans 11/14/2014  . Benign hypertension 11/14/2014  . Dyslipidemia 11/14/2014  . Gastro-esophageal reflux disease without esophagitis 11/14/2014  . Extreme obesity 11/14/2014  . Primary osteoarthritis of both knees 11/14/2014  . Microalbuminuria 10/15/2012  . Controlled type 2 diabetes mellitus with microalbuminuria ([place]) 06/05/2009  . Body dermatophytosis 12/13/2008    Past Surgical History:  Procedure Laterality Date  . COLONOSCOPY WITH PROPOFOL N/A 11/30/2014   Procedure: COLONOSCOPY WITH PROPOFOL;  Surgeon: Lucilla Lame, MD;  Location: [facility];  Service: Endoscopy;  Laterality: N/A;  WITH BIOPSY-- TRANSVERSE COLON POLYP  X 2 DESCENING COLON POLYP  . GALLBLADDER SURGERY    . SEPTOPLASTY    . tonsillectomy    . TUBAL LIGATION      Family History  Problem Relation Age of Onset  .  Colon cancer Mother   . Lung cancer Father   . Hypertension Sister   . Hypertension Sister     Social History   Socioeconomic History  . Marital status: Married    Spouse name: [name]   . Number of children: 2  . Years of education: Not on file  . Highest education level: 12th grade  Occupational History  . Occupation: retired  Scientific laboratory technician  . Financial resource strain: Not hard at all  . Food insecurity:    Worry: Never true  Inability: Never true  . Transportation needs:    Medical: No    Non-medical: No  Tobacco Use  . Smoking status: Never Smoker  . Smokeless tobacco: Never Used  Substance and Sexual Activity  . Alcohol use: No    Alcohol/week: 0.0 standard drinks  . Drug use: No  . Sexual activity: Yes    Partners: Male  Lifestyle  . Physical activity:    Days per week: 3 days    Minutes per session: 20 min  . Stress: Not at all  Relationships  . Social connections:    Talks on phone: More than three times a week    Gets together: More than three times a week    Attends religious service: More than 4 times per year    Active member of club or organization: Yes    Attends meetings of clubs or organizations: More than 4 times per year    Relationship status: Married  . Intimate partner violence:    Fear of current or ex partner: No    Emotionally abused: No    Physically abused: No    Forced sexual activity: No  Other Topics Concern  . 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    She dances for physical activity   She has two grandchildren, one is in college     Current Outpatient Medications:  .  acetaminophen (TYLENOL) 500 MG tablet, Take by mouth., Disp: , Rfl:  .  amLODipine (NORVASC) 10 MG tablet, Take 1 tablet (10 mg total) by mouth daily., Disp: 90 tablet, Rfl: 1 .  aspirin EC 81 MG tablet, Take by mouth., Disp: , Rfl:  .  atorvastatin (LIPITOR) 40 MG tablet,  Take 1 tablet (40 mg total) by mouth daily., Disp: 90 tablet, Rfl: 1 .  Cholecalciferol (VITAMIN D3) 2000 UNITS capsule, VITAMIN D3, 2000UNIT (Oral Capsule)  2 po qday for 0 days  Quantity: 60.00;  Refills: 0   Ordered :15-May-2010  Steele Sizer MD;  Buddy Duty 05-October-2008 Active, Disp: , Rfl:  .  fluticasone (FLONASE) 50 MCG/ACT nasal spray, Place 2 sprays into both nostrils daily., Disp: 48 g, Rfl: 1 .  glucose blood test strip, , Disp: , Rfl:  .  loratadine (CLARITIN) 10 MG tablet, Take 1 tablet (10 mg total) by mouth daily., Disp: 30 tablet, Rfl: 0 .  metFORMIN (GLUCOPHAGE) 500 MG tablet, Take 1 tablet (500 mg total) by mouth every evening., Disp: 90 tablet, Rfl: 1 .  telmisartan-hydrochlorothiazide (MICARDIS HCT) 80-25 MG tablet, Take 1 tablet by mouth daily., Disp: 90 tablet, Rfl: 1  Allergies  Allergen Reactions  . Oxycodone Other (See Comments)  . Oxycodone Hcl     insomnia, agitation     ROS  Constitutional: Negative for fever, positive for mild  weight change.  Respiratory: Negative for cough and shortness of breath.   Cardiovascular: Negative for chest pain or palpitations.  Gastrointestinal: Negative for abdominal pain, no bowel changes.  Musculoskeletal: Negative for gait problem or joint swelling.  Skin: Negative for rash.  Neurological: Negative for dizziness or headache.  No other specific complaints in a complete review of systems (except as listed in HPI above).  Objective  Vitals:   06/16/18 0916  BP: 132/68  Pulse: 90  Resp: 16  Temp: (!) 97.3 F (36.3 C)  TempSrc: Oral  SpO2: 99%  Weight: 209 lb 11.2 oz (95.1  kg)  Height: 4' 11.5" (1.511 m)    Body mass index is 41.65 kg/m.  Physical Exam  Constitutional: Patient appears well-developed and well-nourished. No distress.  HENT: Head: Normocephalic and atraumatic. Ears: B TMs ok, no erythema or effusion; Nose: Nose normal. Mouth/Throat: Oropharynx is clear and moist. No oropharyngeal exudate.  Eyes:  Conjunctivae and EOM are normal. Pupils are equal, round, and reactive to light. No scleral icterus.  Neck: Normal range of motion. Neck supple. No JVD present. No thyromegaly present.  Cardiovascular: Normal rate, regular rhythm and normal heart sounds.  No murmur heard. No BLE edema. Pulmonary/Chest: Effort normal and breath sounds normal. No respiratory distress. Abdominal: Soft. Bowel sounds are normal, no distension. There is no tenderness. no masses Breast: no lumps or masses, no nipple discharge or rashes FEMALE GENITALIA:  External genitalia normal External urethra normal RECTAL: not done Musculoskeletal: Normal range of motion, no joint effusions. No gross deformities Neurological: he is alert and oriented to person, place, and time. No cranial nerve deficit. Coordination, balance, strength, speech and gait are normal.  Skin: Skin is warm and dry. No rash noted. No erythema.  Psychiatric: Patient has a normal mood and affect. behavior is normal. Judgment and thought content normal.   Recent Results (from the past 2160 hour(s))  POCT UA - Microalbumin     Status: Normal   Collection Time: 06/16/18  9:27 AM  Result Value Ref Range   Microalbumin Ur, POC 20 mg/L   Creatinine, POC     Albumin/Creatinine Ratio, Urine, POC       PHQ2/9: Depression screen [facility] 2/9 06/16/2018 03/09/2018 02/12/2018 04/22/2017 03/04/2017  Decreased Interest 0 0 0 0 0  Down, Depressed, Hopeless 0 0 0 0 0  PHQ - 2 Score 0 0 0 0 0  Altered sleeping - - 0 - -  Tired, decreased energy - - 0 - -  Change in appetite - - 0 - -  Feeling bad or failure about yourself  - - 0 - -  Trouble concentrating - - 0 - -  Moving slowly or fidgety/restless - - 0 - -  Suicidal thoughts - - 0 - -  Difficult doing work/chores - - Not difficult at all - -    Fall Risk: Fall Risk  06/16/2018 03/09/2018 02/12/2018 10/14/2017 06/09/2017  Falls in the past year? 0 0 No No No     Functional Status Survey: Is the patient deaf  or have difficulty hearing?: No Does the patient have difficulty seeing, even when wearing glasses/contacts?: Yes(reading glasses) Does the patient have difficulty concentrating, remembering, or making decisions?: No Does the patient have difficulty walking or climbing stairs?: No Does the patient have difficulty dressing or bathing?: No Does the patient have difficulty doing errands alone such as visiting a doctor's office or shopping?: No  Assessment & Plan   1. Controlled type 2 diabetes mellitus with stage 2 chronic kidney disease, without long-term current use of insulin (HCC)  - POCT HgB A1C - POCT UA - Microalbumin - metFORMIN (GLUCOPHAGE) 500 MG tablet; Take 1 tablet (500 mg total) by mouth every evening.  Dispense: 90 tablet; Refill: 1 - telmisartan-hydrochlorothiazide (MICARDIS HCT) 80-25 MG tablet; Take 1 tablet by mouth daily.  Dispense: 90 tablet; Refill: 1  2. Benign hypertension  - amLODipine (NORVASC) 10 MG tablet; Take 1 tablet (10 mg total) by mouth daily.  Dispense: 90 tablet; Refill: 1 - telmisartan-hydrochlorothiazide (MICARDIS HCT) 80-25 MG tablet; Take 1 tablet by mouth daily.  Dispense: 90 tablet; Refill: 1  3. Dyslipidemia  Atorvastatin (LIPITOR) 40 MG tablet; Take 1 tablet (40 mg total) by mouth daily.  Dispense: 90 tablet; Refill: 1  4. Vitamin D deficiency  Continue otc supplementation   5. Morbid obesity ([place])  Discussed with the patient the risk posed by an increased BMI. Discussed importance of portion control, calorie counting and at least 150 minutes of physical activity weekly. Avoid sweet beverages and drink more water. Eat at least 6 servings of fruit and vegetables daily   6. Primary osteoarthritis of both knees  Doing better, not on medication, uses topica medication prn   7. Well adult exam   8. Other seasonal allergic rhinitis  - fluticasone (FLONASE) 50 MCG/ACT nasal spray; Place 2 sprays into both nostrils daily.  Dispense: 48 g;  Refill: 1  -USPSTF grade A and B recommendations reviewed with patient; age-appropriate recommendations, preventive care, screening tests, etc discussed and encouraged; healthy living encouraged; see AVS for patient education given to patient -Discussed importance of 150 minutes of physical activity weekly, eat two servings of fish weekly, eat one serving of tree nuts ( cashews, pistachios, pecans, almonds.Marland Kitchen) every other day, eat 6 servings of fruit/vegetables daily and drink plenty of water and avoid sweet beverages.

## 2018-10-28 ENCOUNTER — Other Ambulatory Visit: Payer: Self-pay

## 2018-10-28 NOTE — Patient Outreach (Signed)
[facility]) Care Management  10/28/2018  Yvonne Curtis 02-02-1951 [id number]   Medication Adherence call to Mrs. Yvonne Curtis [facility] [employer] Verify spoke with patient she is past due on Telmisartan/Hctz 0/25 mg patient explain she is taking 1 tablet daily and has medication at this time she did say at some point [facility] had it on back order and was not receiving it.Mrs. Yvonne Curtis is showing past due under [place].   [facility] Management Direct Dial [phone]  Fax [phone] [email or web address]

## 2018-10-30 IMAGING — MG MM DIGITAL SCREENING BILAT W/ CAD
5 series · 5 of 5 positions shown · non-contrast
Comparison: Previous exam(s).

CLINICAL DATA: Screening.

EXAM:
DIGITAL SCREENING BILATERAL MAMMOGRAM WITH CAD

[R XCCM]
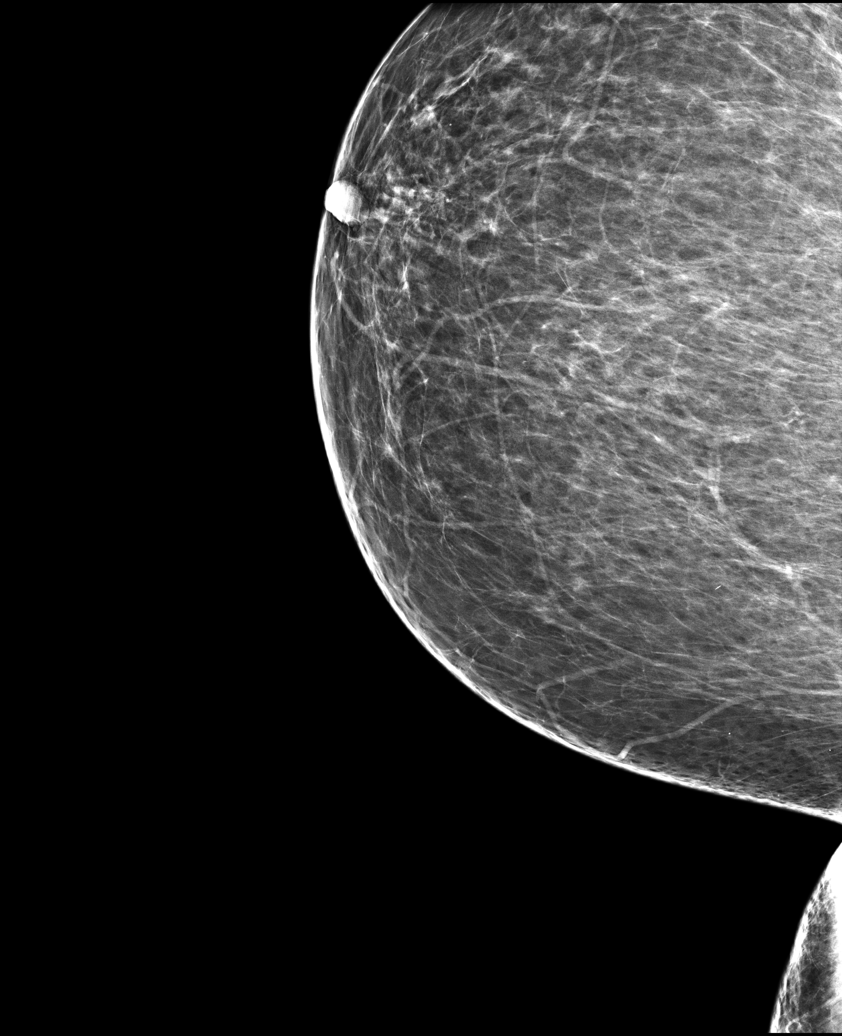

[L CC]
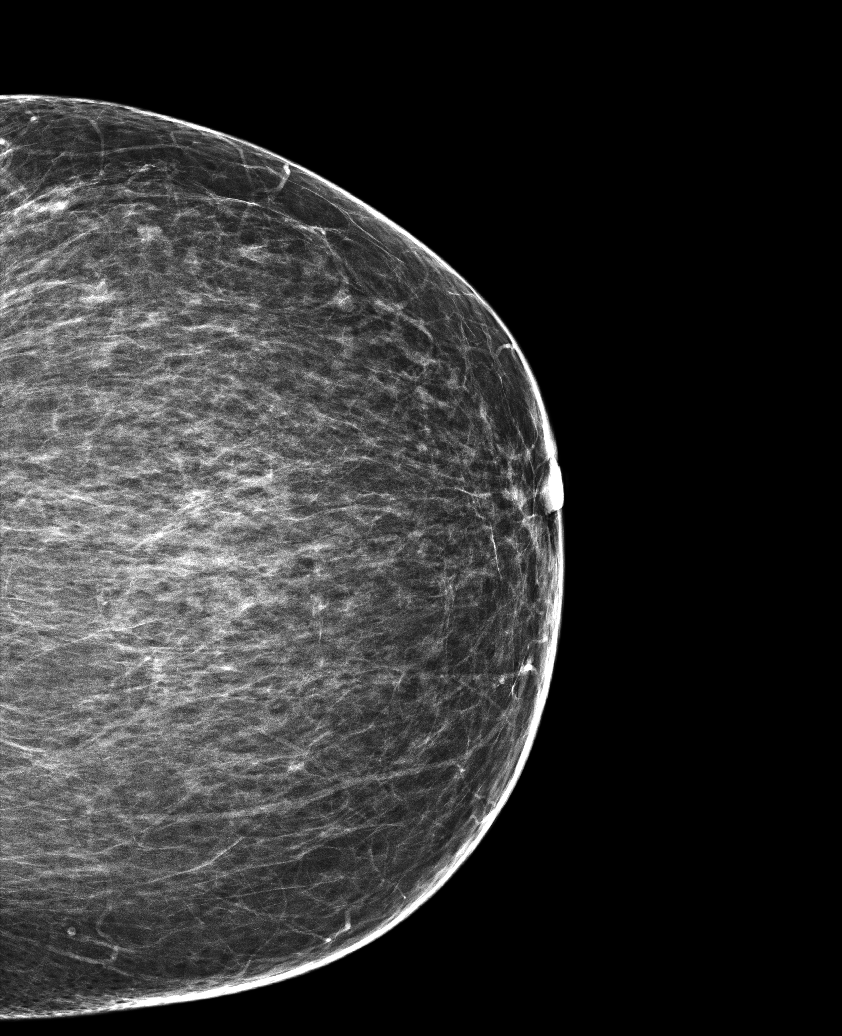

[R CC]
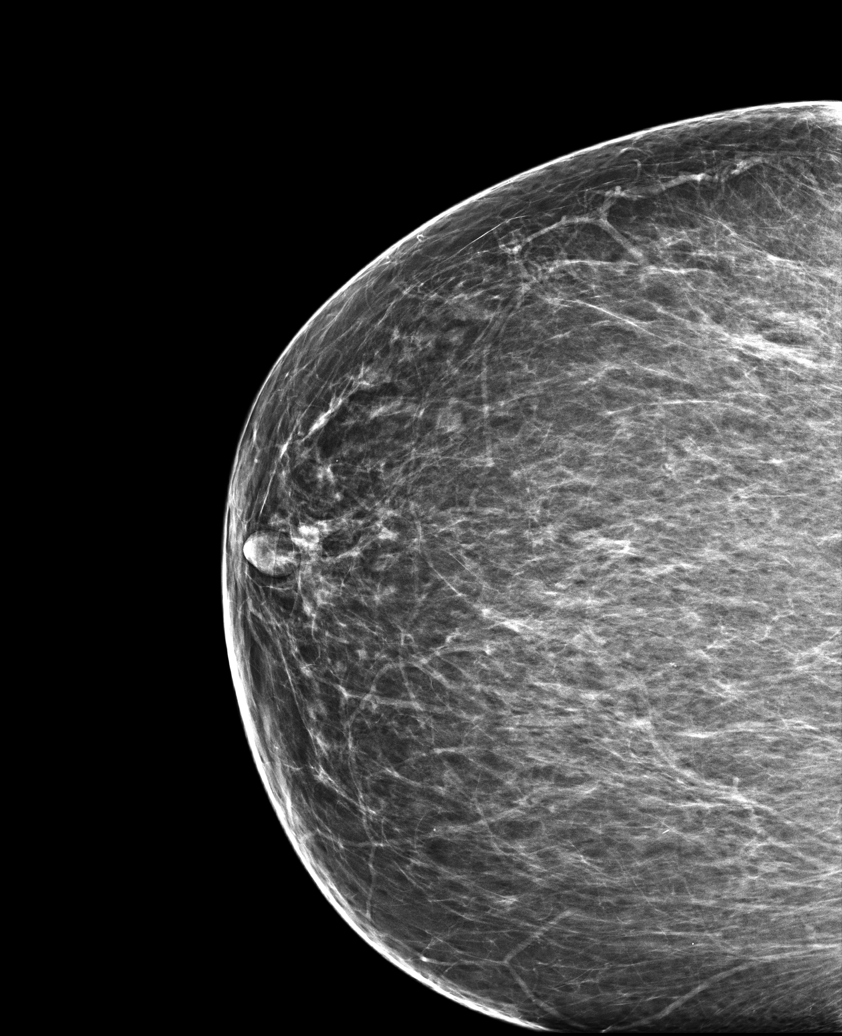

[R MLO]
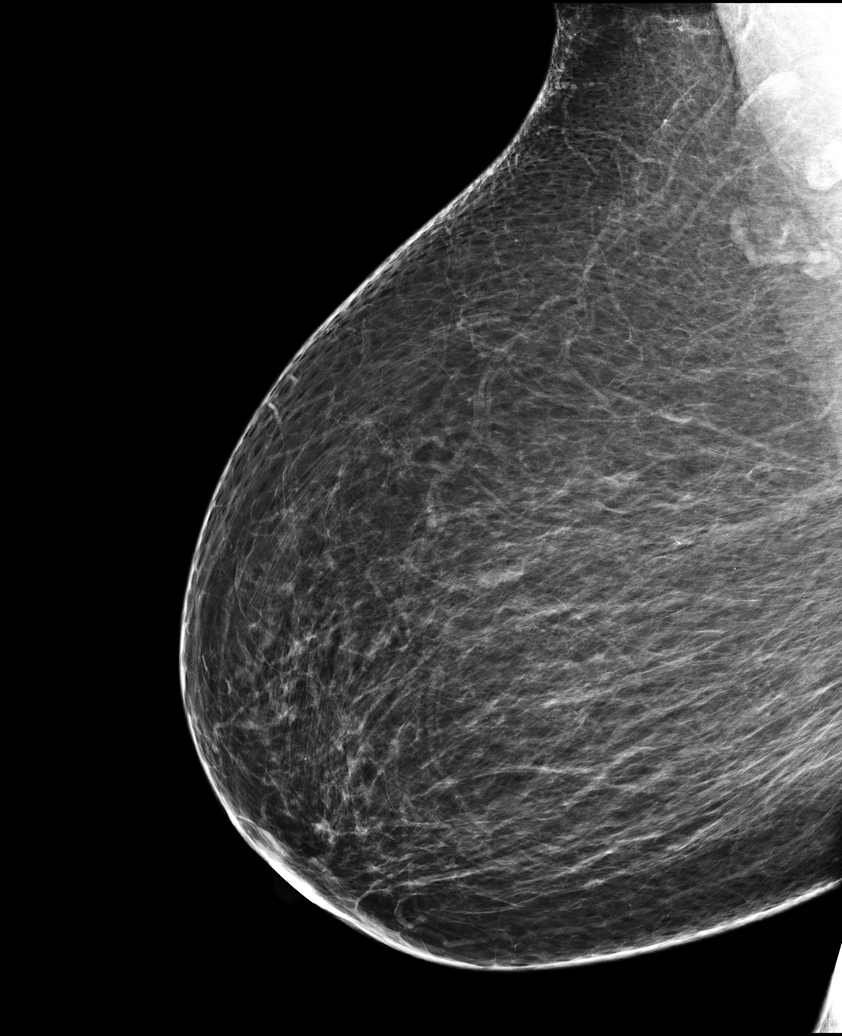

[L MLO]
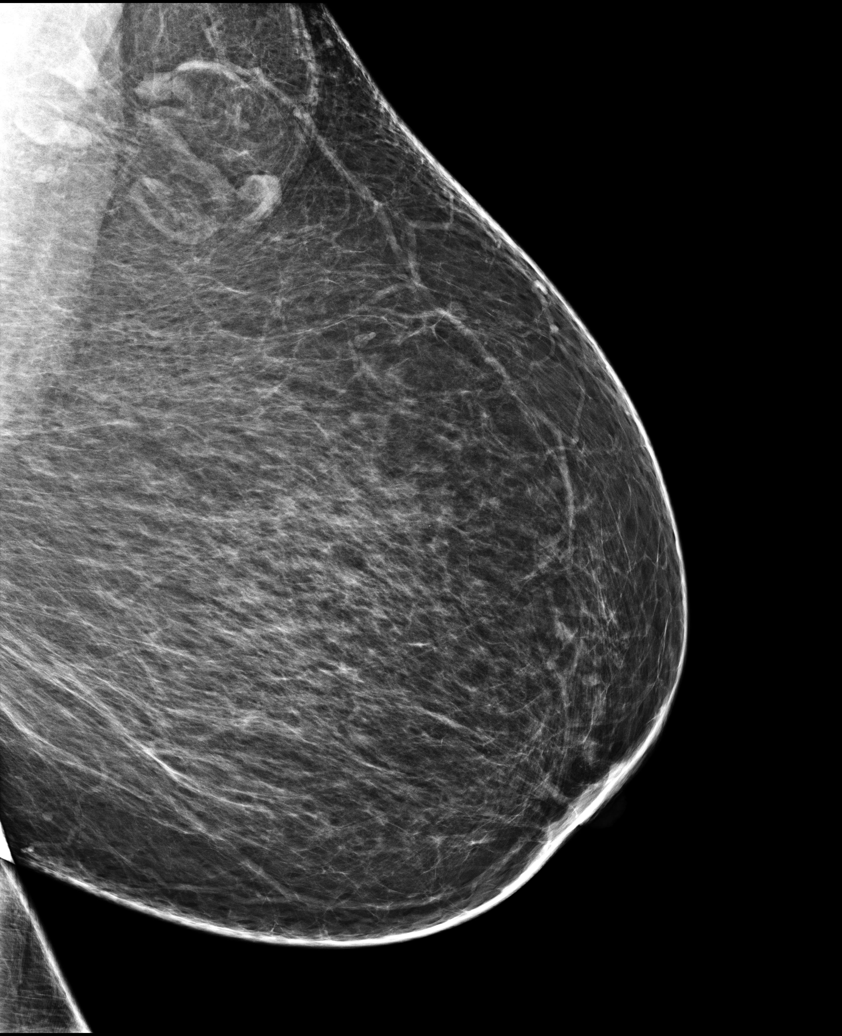

[5 of 5 positions shown; findings below may reference images not displayed]

ACR Breast Density Category b: There are scattered areas of
fibroglandular density.
FINDINGS: There are no findings suspicious for malignancy. Images were
processed with CAD.
IMPRESSION: No mammographic evidence of malignancy. A result letter of this
screening mammogram will be mailed directly to the patient.

RECOMMENDATION:
Screening mammogram in one year. (Code:AS-G-LCT)

BI-RADS CATEGORY  1: Negative.

## 2018-11-03 ENCOUNTER — Other Ambulatory Visit: Payer: Self-pay

## 2018-11-03 DIAGNOSIS — I10 Essential (primary) hypertension: Secondary | ICD-10-CM

## 2018-11-03 DIAGNOSIS — E1122 Type 2 diabetes mellitus with diabetic chronic kidney disease: Secondary | ICD-10-CM

## 2018-11-03 MED ORDER — TELMISARTAN-HCTZ 80-25 MG PO TABS: 1.0000 | ORAL_TABLET | Freq: Every day | ORAL | 0 refills | Status: DC

## 2018-11-03 NOTE — Telephone Encounter (Signed)
[place] with [employer] and states that the patient's telmisartan-hydrochlorothiazide (MICARDIS HCT) 80-25 MG tablet Is on back order. States that the pharmacy does have this medication in the split. Would just need 2 separate prescriptions set over. Please advise.  [place], [place] - [address]

## 2018-11-08 ENCOUNTER — Other Ambulatory Visit: Payer: Self-pay | Admitting: Family Medicine

## 2018-11-08 ENCOUNTER — Other Ambulatory Visit: Payer: Self-pay

## 2018-11-08 DIAGNOSIS — N182 Chronic kidney disease, stage 2 (mild): Secondary | ICD-10-CM

## 2018-11-08 DIAGNOSIS — E1122 Type 2 diabetes mellitus with diabetic chronic kidney disease: Secondary | ICD-10-CM

## 2018-11-08 DIAGNOSIS — I10 Essential (primary) hypertension: Secondary | ICD-10-CM

## 2018-11-08 DIAGNOSIS — E785 Hyperlipidemia, unspecified: Secondary | ICD-10-CM

## 2018-11-16 ENCOUNTER — Encounter: Payer: Self-pay | Admitting: Family Medicine

## 2018-11-16 ENCOUNTER — Ambulatory Visit (INDEPENDENT_AMBULATORY_CARE_PROVIDER_SITE_OTHER): Payer: Medicare Other | Admitting: Family Medicine

## 2018-11-16 ENCOUNTER — Other Ambulatory Visit: Payer: Self-pay

## 2018-11-16 ENCOUNTER — Other Ambulatory Visit: Payer: Self-pay | Admitting: Family Medicine

## 2018-11-16 VITALS — BP 140/82 | HR 100 | Temp 97.7°F | Resp 16 | Ht 59.5 in | Wt 204.3 lb

## 2018-11-16 DIAGNOSIS — E1122 Type 2 diabetes mellitus with diabetic chronic kidney disease: Secondary | ICD-10-CM

## 2018-11-16 DIAGNOSIS — E785 Hyperlipidemia, unspecified: Secondary | ICD-10-CM | POA: Diagnosis not present

## 2018-11-16 DIAGNOSIS — M170 Bilateral primary osteoarthritis of knee: Secondary | ICD-10-CM | POA: Diagnosis not present

## 2018-11-16 DIAGNOSIS — N182 Chronic kidney disease, stage 2 (mild): Secondary | ICD-10-CM

## 2018-11-16 DIAGNOSIS — I10 Essential (primary) hypertension: Secondary | ICD-10-CM

## 2018-11-16 LAB — CBC WITH DIFFERENTIAL/PLATELET
Absolute Monocytes: 560 cells/uL (ref 200–950)
Basophils Absolute: 80 cells/uL (ref 0–200)
Basophils Relative: 1 %
Eosinophils Absolute: 360 cells/uL (ref 15–500)
Eosinophils Relative: 4.5 %
HCT: 40.2 % (ref 35.0–45.0)
Hemoglobin: 13 g/dL (ref 11.7–15.5)
Lymphs Abs: 1544 cells/uL (ref 850–3900)
MCH: 28 pg (ref 27.0–33.0)
MCHC: 32.3 g/dL (ref 32.0–36.0)
MCV: 86.5 fL (ref 80.0–100.0)
MPV: 10.8 fL (ref 7.5–12.5)
Monocytes Relative: 7 %
Neutro Abs: 5456 cells/uL (ref 1500–7800)
Neutrophils Relative %: 68.2 %
Platelets: 360 10*3/uL (ref 140–400)
RBC: 4.65 10*6/uL (ref 3.80–5.10)
RDW: 14.9 % (ref 11.0–15.0)
Total Lymphocyte: 19.3 %
WBC: 8 10*3/uL (ref 3.8–10.8)

## 2018-11-16 LAB — COMPLETE METABOLIC PANEL WITH GFR
AG Ratio: 1.3 (calc) (ref 1.0–2.5)
ALT: 13 U/L (ref 6–29)
AST: 14 U/L (ref 10–35)
Albumin: 4.2 g/dL (ref 3.6–5.1)
Alkaline phosphatase (APISO): 90 U/L (ref 37–153)
BUN/Creatinine Ratio: 10 (calc) (ref 6–22)
BUN: 10 mg/dL (ref 7–25)
CO2: 29 mmol/L (ref 20–32)
Calcium: 10.5 mg/dL — ABNORMAL HIGH (ref 8.6–10.4)
Chloride: 105 mmol/L (ref 98–110)
Creat: 1.03 mg/dL — ABNORMAL HIGH (ref 0.50–0.99)
GFR, Est African American: 65 mL/min/{1.73_m2} (ref >=60)
GFR, Est Non African American: 56 mL/min/{1.73_m2} — ABNORMAL LOW (ref >=60)
Globulin: 3.2 g/dL (calc) (ref 1.9–3.7)
Glucose, Bld: 92 mg/dL (ref 65–99)
Potassium: 3.7 mmol/L (ref 3.5–5.3)
Sodium: 142 mmol/L (ref 135–146)
Total Bilirubin: 0.5 mg/dL (ref 0.2–1.2)
Total Protein: 7.4 g/dL (ref 6.1–8.1)

## 2018-11-16 LAB — LIPID PANEL
Cholesterol: 143 mg/dL (ref <200)
HDL: 45 mg/dL — ABNORMAL LOW (ref >=50)
LDL Cholesterol (Calc): 80 mg/dL (calc)
Non-HDL Cholesterol (Calc): 98 mg/dL (calc) (ref <130)
Total CHOL/HDL Ratio: 3.2 (calc) (ref <5.0)
Triglycerides: 101 mg/dL (ref <150)

## 2018-11-16 LAB — POCT GLYCOSYLATED HEMOGLOBIN (HGB A1C): Hemoglobin A1C: 5.8 % — AB (ref 4.0–5.6)

## 2018-11-16 MED ORDER — AMLODIPINE BESYLATE 10 MG PO TABS: 10.0000 mg | ORAL_TABLET | Freq: Every day | ORAL | 1 refills | Status: DC

## 2018-11-16 MED ORDER — ATORVASTATIN CALCIUM 40 MG PO TABS: 40.0000 mg | ORAL_TABLET | Freq: Every day | ORAL | 1 refills | Status: DC

## 2018-11-16 MED ORDER — METFORMIN HCL 500 MG PO TABS: 500.0000 mg | ORAL_TABLET | Freq: Every evening | ORAL | 1 refills | Status: DC

## 2018-11-16 MED ORDER — TELMISARTAN-HCTZ 80-25 MG PO TABS: 1.0000 | ORAL_TABLET | Freq: Every day | ORAL | 1 refills | Status: DC

## 2018-11-16 NOTE — Progress Notes (Signed)
Name: Yvonne Curtis   MRN: [id number]    DOB: Nov 26, 1950   Date:11/16/2018       Progress Note  Subjective  Chief Complaint  Chief Complaint  Patient presents with  . Diabetes  . Hypertension  . Dyslipidemia    HPI  DMII with renal manifestation: she has been taking Metformin daily and denies side effects, including no diarrhea,shedoes not have a blood sugar meter but hgbA1C has been well controlled and not symptoms of hypoglycemia.She denies polyphagia, polyuria or polydipsia. Last urine micro negative but she has a history of microalbuminuria that resolved withARB. Eye exam is up to date. HgbA1C today was 5.8%  HTN: she has been taking her medications as prescribed and denies side effects. Denies orthostatic changes, palpitationor chest pain.BP today is borderline at 140/82, but usually controlled, she is worried about her young adult grandson int he mist of the [facility] protest and hatred .   Hyperlipidemia: Taking Lipitor and denies side effects. No myalgia.We will recheck labs today   Obesity: She is still in her living room, portion control and continues to lose weight, down 5 lbs since last visit and 9 lbs since last Fall. Continue the hard work   OA knee: she states her knees no longer bothering her since she stopped working.She dances at home for physical activity. She occasionally uses topical medication otc Unchanged    Patient Active Problem List   Diagnosis Date Noted  . Acanthosis nigricans 11/14/2014  . Benign hypertension 11/14/2014  . Dyslipidemia 11/14/2014  . Gastro-esophageal reflux disease without esophagitis 11/14/2014  . Extreme obesity 11/14/2014  . Primary osteoarthritis of both knees 11/14/2014  . Microalbuminuria 10/15/2012  . Controlled type 2 diabetes mellitus with microalbuminuria ([place]) 06/05/2009  . Body dermatophytosis 12/13/2008    Past Surgical History:  Procedure Laterality Date  . COLONOSCOPY WITH PROPOFOL N/A 11/30/2014   Procedure: COLONOSCOPY WITH PROPOFOL;  Surgeon: Lucilla Lame, MD;  Location: [facility];  Service: Endoscopy;  Laterality: N/A;  WITH BIOPSY-- TRANSVERSE COLON POLYP  X 2 DESCENING COLON POLYP  . GALLBLADDER SURGERY    . SEPTOPLASTY    . tonsillectomy    . TUBAL LIGATION      Family History  Problem Relation Age of Onset  . Colon cancer Mother   . Lung cancer Father   . Hypertension Sister   . Hypertension Sister     Social History   Socioeconomic History  . Marital status: Married    Spouse name: [name]   . Number of children: 2  . Years of education: Not on file  . Highest education level: 12th grade  Occupational History  . Occupation: retired  Scientific laboratory technician  . Financial resource strain: Not hard at all  . Food insecurity    Worry: Never true    Inability: Never true  . Transportation needs    Medical: No    Non-medical: No  Tobacco Use  . Smoking status: Never Smoker  . Smokeless tobacco: Never Used  Substance and Sexual Activity  . Alcohol use: No    Alcohol/week: 0.0 standard drinks  . Drug use: No  . Sexual activity: Yes    Partners: Male  Lifestyle  . Physical activity    Days per week: 3 days    Minutes per session: 20 min  . Stress: Not at all  Relationships  . Social connections    Talks on phone: More than three times a week    Gets together: More than  three times a week    Attends religious service: More than 4 times per year    Active member of club or organization: Yes    Attends meetings of clubs or organizations: More than 4 times per year    Relationship status: Married  . Intimate partner violence    Fear of current or ex partner: No    Emotionally abused: No    Physically abused: No    Forced sexual activity: No  Other Topics Concern  . 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    She dances for physical activity   She has two grandchildren,  one is in college     Current Outpatient Medications:  .  acetaminophen (TYLENOL) 500 MG tablet, Take by mouth., Disp: , Rfl:  .  amLODipine (NORVASC) 10 MG tablet, Take 1 tablet (10 mg total) by mouth daily., Disp: 90 tablet, Rfl: 1 .  aspirin EC 81 MG tablet, Take by mouth., Disp: , Rfl:  .  atorvastatin (LIPITOR) 40 MG tablet, Take 1 tablet (40 mg total) by mouth daily., Disp: 90 tablet, Rfl: 1 .  Cholecalciferol (VITAMIN D3) 2000 UNITS capsule, VITAMIN D3, 2000UNIT (Oral Capsule)  2 po qday for 0 days  Quantity: 60.00;  Refills: 0   Ordered :15-May-2010  Steele Sizer MD;  Buddy Duty 05-October-2008 Active, Disp: , Rfl:  .  fluticasone (FLONASE) 50 MCG/ACT nasal spray, Place 2 sprays into both nostrils daily., Disp: 48 g, Rfl: 1 .  glucose blood test strip, , Disp: , Rfl:  .  loratadine (CLARITIN) 10 MG tablet, Take 1 tablet (10 mg total) by mouth daily., Disp: 30 tablet, Rfl: 0 .  metFORMIN (GLUCOPHAGE) 500 MG tablet, Take 1 tablet (500 mg total) by mouth every evening., Disp: 90 tablet, Rfl: 1 .  telmisartan-hydrochlorothiazide (MICARDIS HCT) 80-25 MG tablet, Take 1 tablet by mouth daily., Disp: 90 tablet, Rfl: 0  Allergies  Allergen Reactions  . Oxycodone Other (See Comments)  . Oxycodone Hcl     insomnia, agitation    I personally reviewed active problem list, medication list, allergies, family history, social history with the patient/caregiver today.   ROS  Constitutional: Negative for fever , positive for mild  weight change.  Respiratory: Negative for cough and shortness of breath.   Cardiovascular: Negative for chest pain or palpitations.  Gastrointestinal: Negative for abdominal pain, no bowel changes.  Musculoskeletal: Negative for gait problem or joint swelling.  Skin: Negative for rash.  Neurological: Negative for dizziness or headache.  No other specific complaints in a complete review of systems (except as listed in HPI above).   Objective  Vitals:   11/16/18  0754  BP: 140/82  Pulse: 100  Resp: 16  Temp: 97.7 F (36.5 C)  TempSrc: Temporal  SpO2: 97%  Weight: 204 lb 4.8 oz (92.7 kg)  Height: 4' 11.5" (1.511 m)    Body mass index is 40.57 kg/m.  Physical Exam  Constitutional: Patient appears well-developed and well-nourished. Obese  No distress.  HEENT: head atraumatic, normocephalic, pupils equal and reactive to light, neck supple Cardiovascular: Normal rate, regular rhythm and normal heart sounds.  No murmur heard. positive for trace BLE edema. Pulmonary/Chest: Effort normal and breath sounds normal. No respiratory distress. Abdominal: Soft.  There is no tenderness. Psychiatric: Patient has a normal mood and affect. behavior is normal. Judgment and thought content normal.   PHQ2/9:  Depression screen [facility] 2/9 11/16/2018 06/16/2018 03/09/2018 02/12/2018 04/22/2017  Decreased Interest 0 0 0 0 0  Down, Depressed, Hopeless 0 0 0 0 0  PHQ - 2 Score 0 0 0 0 0  Altered sleeping 0 - - 0 -  Tired, decreased energy 0 - - 0 -  Change in appetite 0 - - 0 -  Feeling bad or failure about yourself  0 - - 0 -  Trouble concentrating 0 - - 0 -  Moving slowly or fidgety/restless 0 - - 0 -  Suicidal thoughts 0 - - 0 -  PHQ-9 Score 0 - - - -  Difficult doing work/chores - - - Not difficult at all -    phq 9 is negative   Fall Risk: Fall Risk  11/16/2018 06/16/2018 03/09/2018 02/12/2018 10/14/2017  Falls in the past year? 0 0 0 No No  Number falls in past yr: 0 - - - -  Injury with Fall? 0 - - - -     Functional Status Survey: Is the patient deaf or have difficulty hearing?: No Does the patient have difficulty seeing, even when wearing glasses/contacts?: No Does the patient have difficulty concentrating, remembering, or making decisions?: Yes Does the patient have difficulty walking or climbing stairs?: No Does the patient have difficulty dressing or bathing?: No Does the patient have difficulty doing errands alone such as visiting a doctor's  office or shopping?: No    Assessment & Plan   1. Controlled type 2 diabetes mellitus with stage 2 chronic kidney disease, without long-term current use of insulin (HCC)  - POCT HgB A1C - metFORMIN (GLUCOPHAGE) 500 MG tablet; Take 1 tablet (500 mg total) by mouth every evening.  Dispense: 90 tablet; Refill: 1 - telmisartan-hydrochlorothiazide (MICARDIS HCT) 80-25 MG tablet; Take 1 tablet by mouth daily.  Dispense: 90 tablet; Refill: 1  2. Benign hypertension  - amLODipine (NORVASC) 10 MG tablet; Take 1 tablet (10 mg total) by mouth daily.  Dispense: 90 tablet; Refill: 1 - telmisartan-hydrochlorothiazide (MICARDIS HCT) 80-25 MG tablet; Take 1 tablet by mouth daily.  Dispense: 90 tablet; Refill: 1 - COMPLETE METABOLIC PANEL WITH GFR - CBC with Differential/Platelet  3. Dyslipidemia  - atorvastatin (LIPITOR) 40 MG tablet; Take 1 tablet (40 mg total) by mouth daily.  Dispense: 90 tablet; Refill: 1 - Lipid panel  4. Primary osteoarthritis of both knees

## 2018-11-23 ENCOUNTER — Ambulatory Visit: Payer: Medicare Other | Admitting: Family Medicine

## 2018-11-25 DIAGNOSIS — H52223 Regular astigmatism, bilateral: Secondary | ICD-10-CM | POA: Diagnosis not present

## 2018-11-25 DIAGNOSIS — H2513 Age-related nuclear cataract, bilateral: Secondary | ICD-10-CM | POA: Diagnosis not present

## 2018-11-25 DIAGNOSIS — E119 Type 2 diabetes mellitus without complications: Secondary | ICD-10-CM | POA: Diagnosis not present

## 2018-11-25 DIAGNOSIS — H25013 Cortical age-related cataract, bilateral: Secondary | ICD-10-CM | POA: Diagnosis not present

## 2018-11-25 DIAGNOSIS — H5203 Hypermetropia, bilateral: Secondary | ICD-10-CM | POA: Diagnosis not present

## 2018-11-25 LAB — HM DIABETES EYE EXAM

## 2018-11-29 ENCOUNTER — Encounter: Payer: Self-pay | Admitting: Family Medicine

## 2018-12-15 ENCOUNTER — Telehealth: Payer: Self-pay | Admitting: Family Medicine

## 2018-12-15 NOTE — Chronic Care Management (AMB) (Signed)
Chronic Care Management   Note  12/15/2018 Name: Yvonne Curtis MRN: [id number] DOB: 1950-07-14  Yvonne Curtis is a 68 y.o. year old female who is a primary care patient of Steele Sizer, MD. I reached out to [name] by phone today in response to a referral sent by Yvonne Curtis's health plan.    Yvonne Curtis was given information about Chronic Care Management services today including:  1. CCM service includes personalized support from designated clinical staff supervised by her physician, including individualized plan of care and coordination with other care providers 2. 24/7 contact phone numbers for assistance for urgent and routine care needs. 3. Service will only be billed when office clinical staff spend 20 minutes or more in a month to coordinate care. 4. Only one practitioner may furnish and bill the service in a calendar month. 5. The patient may stop CCM services at any time (effective at the end of the month) by phone call to the office staff. 6. The patient will be responsible for cost sharing (co-pay) of up to 20% of the service fee (after annual deductible is met).  Patient agreed to services and verbal consent obtained.   Follow up plan: Telephone appointment with CCM team member scheduled for: 12/21/2018  [place]  [email or web address]   ??[phone]

## 2018-12-21 ENCOUNTER — Ambulatory Visit: Payer: Medicare Other

## 2018-12-21 ENCOUNTER — Other Ambulatory Visit: Payer: Self-pay

## 2018-12-21 ENCOUNTER — Encounter: Payer: Self-pay | Admitting: Pharmacist

## 2018-12-21 NOTE — Chronic Care Management (AMB) (Signed)
  Chronic Care Management   Note  12/21/2018 Name: Yvonne Curtis MRN: [id number] DOB: Dec 30, 1950  68 y.o. year old female referred to Chronic Care Management by her health plan. Last office visit with Steele Sizer, MD was 11/16/18.   Was unable to reach patient via telephone today and have left HIPAA compliant voicemail asking patient to return my call. (unsuccessful outreach #1).  Follow up plan: A HIPPA compliant phone message was left for the patient providing contact information and requesting a return call.  The care management team will reach out to the patient again over the next 5-7 days.   Ruben Reason, PharmD Clinical Pharmacist [facility] [phone]

## 2018-12-21 NOTE — Progress Notes (Signed)
This encounter was created in error - please disregard.

## 2018-12-28 ENCOUNTER — Ambulatory Visit (INDEPENDENT_AMBULATORY_CARE_PROVIDER_SITE_OTHER): Payer: Medicare Other | Admitting: Pharmacist

## 2018-12-28 DIAGNOSIS — M170 Bilateral primary osteoarthritis of knee: Secondary | ICD-10-CM | POA: Diagnosis not present

## 2018-12-28 DIAGNOSIS — I10 Essential (primary) hypertension: Secondary | ICD-10-CM | POA: Diagnosis not present

## 2018-12-31 NOTE — Chronic Care Management (AMB) (Signed)
  Chronic Care Management   Follow Up Note   12/31/2018 - LATE ENTRY Name: Yvonne Curtis MRN: [id number] DOB: Jul 20, 1950  Subjective Yvonne Curtis is a 68 y.o. year old female who is a primary care patient of Steele Sizer, MD. The CCM clinical pharmacist was consulted for assistance with chronic disease management and care coordination needs by patient's health plan. Successful telephone outreach for initial pharmacy consult. HIPAA identifiers verified.   Assessment Review of patient status, including review of consultants reports, relevant laboratory and other test results, and collaboration with appropriate care team members and the patient's provider was performed as part of comprehensive patient evaluation and provision of chronic care management services.    Patient denies any issues with medication affordability. No noted DDI, inappropriate mediations   SDOH (Social Determinants of Health) screening performed today: None. See Care Plan for related entries.   Outpatient Encounter Medications as of 12/28/2018  Medication Sig Note  . amLODipine (NORVASC) 10 MG tablet Take 1 tablet (10 mg total) by mouth daily.   Marland Kitchen aspirin EC 81 MG tablet Take by mouth.   Marland Kitchen atorvastatin (LIPITOR) 40 MG tablet Take 1 tablet (40 mg total) by mouth daily.   . Cholecalciferol (VITAMIN D3) 2000 UNITS capsule VITAMIN D3, 2000UNIT (Oral Capsule)  2 po qday for 0 days  Quantity: 60.00;  Refills: 0   Ordered :15-May-2010  Steele Sizer MD;  Buddy Duty 05-October-2008 Active   . fluticasone (FLONASE) 50 MCG/ACT nasal spray Place 2 sprays into both nostrils daily.   . metFORMIN (GLUCOPHAGE) 500 MG tablet Take 1 tablet (500 mg total) by mouth every evening.   Marland Kitchen telmisartan-hydrochlorothiazide (MICARDIS HCT) 80-25 MG tablet Take 1 tablet by mouth daily.   Marland Kitchen acetaminophen (TYLENOL) 500 MG tablet Take by mouth.   . loratadine (CLARITIN) 10 MG tablet Take 1 tablet (10 mg total) by mouth daily. (Patient not  taking: Reported on 12/28/2018)   . [DISCONTINUED] glucose blood test strip  05/23/2015: Received from: [facility]   No facility-administered encounter medications on file as of 12/28/2018.      Goals Addressed            This Visit's Progress   . I want to keep doing well (pt-stated)       Current Barriers:  . Adherence barrier with nationwide ARB shortages  Pharmacist Clinical Goal(s):  Marland Kitchen Over the next 90 days, patient will demonstrate Improved medication adherence as evidenced by pharmacy fill history  Interventions: . Comprehensive medication review performed. Updated medication list in EMR . Counseled patient on BP monitoring at home, importance of taking amlodipine and telmisartan-HCTZ daily for BP control . Education on [place]  Patient Self Care Activities:  . Self administers medications as prescribed . Calls provider office for new concerns or questions . Obtain a home BP cuff through [place]   Initial goal documentation         Telephone follow up appointment with care management team member scheduled for: 90 days   Ruben Reason, PharmD Clinical Pharmacist [facility] [phone]

## 2018-12-31 NOTE — Patient Instructions (Signed)
  Thank you allowing the Chronic Care Management Team to be a part of your care!   Please call a member of the CCM (Chronic Care Management) Team with any questions or case management needs:   Vanetta Mulders, BSN Nurse Care Coordinator  [phone]  Ruben Reason, PharmD  Clinical Pharmacist  [phone]  Elliot Gurney, LCSW Clinical Social Worker [phone]  Goals Addressed            This Visit's Progress   . I want to keep doing well (pt-stated)       Current Barriers:  . Adherence barrier with nationwide ARB shortages  Pharmacist Clinical Goal(s):  Marland Kitchen Over the next 90 days, patient will demonstrate Improved medication adherence as evidenced by pharmacy fill history  Interventions: . Comprehensive medication review performed. Updated medication list in EMR . Counseled patient on BP monitoring at home, importance of taking amlodipine and telmisartan-HCTZ daily for BP control . Education on [place]  Patient Self Care Activities:  . Self administers medications as prescribed . Calls provider office for new concerns or questions . Obtain a home BP cuff through [place]   Initial goal documentation        The patient verbalized understanding of instructions provided today and declined a print copy of patient instruction materials.

## 2019-01-18 LAB — HM DIABETES EYE EXAM

## 2019-01-24 ENCOUNTER — Encounter: Payer: Self-pay | Admitting: Family Medicine

## 2019-02-09 ENCOUNTER — Other Ambulatory Visit: Payer: Self-pay

## 2019-02-09 ENCOUNTER — Ambulatory Visit: Payer: Self-pay

## 2019-02-09 DIAGNOSIS — Z23 Encounter for immunization: Secondary | ICD-10-CM

## 2019-02-10 ENCOUNTER — Other Ambulatory Visit: Payer: Self-pay | Admitting: Family Medicine

## 2019-02-10 ENCOUNTER — Other Ambulatory Visit: Payer: Self-pay

## 2019-02-10 DIAGNOSIS — E1122 Type 2 diabetes mellitus with diabetic chronic kidney disease: Secondary | ICD-10-CM

## 2019-02-10 DIAGNOSIS — N182 Chronic kidney disease, stage 2 (mild): Secondary | ICD-10-CM

## 2019-02-10 DIAGNOSIS — I10 Essential (primary) hypertension: Secondary | ICD-10-CM

## 2019-02-10 NOTE — Patient Outreach (Signed)
[facility]) Care Management  02/10/2019  Yvonne Curtis February 12, 1951 [id number]   Medication Adherence call to [name] [facility] Identifiers Verify spoke with patient she is past due on Telmisartan/Hctz 80/25 mg patient explain she is taking 1 tablet daily she fills her pill box on a weekly basis patient has medication at this time but ask to call [facility] to order it, Suzie Portela will have it ready for patient to pick up. [name] is showing past due under [place].   [facility] Management Direct Dial [phone]  Fax [phone] [email or web address]

## 2019-02-16 ENCOUNTER — Encounter: Payer: Self-pay | Admitting: Family Medicine

## 2019-02-16 ENCOUNTER — Other Ambulatory Visit: Payer: Self-pay

## 2019-02-16 ENCOUNTER — Ambulatory Visit (INDEPENDENT_AMBULATORY_CARE_PROVIDER_SITE_OTHER): Payer: Medicare Other | Admitting: Family Medicine

## 2019-02-16 VITALS — BP 130/72 | HR 101 | Temp 97.3°F | Resp 16 | Ht 59.0 in | Wt 209.8 lb

## 2019-02-16 DIAGNOSIS — E1122 Type 2 diabetes mellitus with diabetic chronic kidney disease: Secondary | ICD-10-CM

## 2019-02-16 DIAGNOSIS — I10 Essential (primary) hypertension: Secondary | ICD-10-CM

## 2019-02-16 DIAGNOSIS — J302 Other seasonal allergic rhinitis: Secondary | ICD-10-CM

## 2019-02-16 DIAGNOSIS — N182 Chronic kidney disease, stage 2 (mild): Secondary | ICD-10-CM

## 2019-02-16 DIAGNOSIS — E785 Hyperlipidemia, unspecified: Secondary | ICD-10-CM

## 2019-02-16 LAB — POCT GLYCOSYLATED HEMOGLOBIN (HGB A1C): Hemoglobin A1C: 5.7 % — AB (ref 4.0–5.6)

## 2019-02-16 MED ORDER — FLUTICASONE PROPIONATE 50 MCG/ACT NA SUSP: 2.0000 | Freq: Every day | NASAL | 1 refills | Status: DC

## 2019-02-16 MED ORDER — AMLODIPINE BESYLATE 10 MG PO TABS: 10.0000 mg | ORAL_TABLET | Freq: Every day | ORAL | 1 refills | Status: DC

## 2019-02-16 MED ORDER — ATORVASTATIN CALCIUM 40 MG PO TABS: 40.0000 mg | ORAL_TABLET | Freq: Every day | ORAL | 1 refills | Status: DC

## 2019-02-16 MED ORDER — METFORMIN HCL 500 MG PO TABS: 500.0000 mg | ORAL_TABLET | Freq: Every evening | ORAL | 1 refills | Status: DC

## 2019-02-16 MED ORDER — TELMISARTAN-HCTZ 80-25 MG PO TABS: 1.0000 | ORAL_TABLET | Freq: Every day | ORAL | 1 refills | Status: DC

## 2019-02-16 NOTE — Progress Notes (Signed)
Name: Yvonne Curtis   MRN: [id number]    DOB: 1951-04-10   Date:02/16/2019       Progress Note  Subjective  Chief Complaint  Chief Complaint  Patient presents with  . Diabetes  . Hypertension  . Dyslipidemia    HPI  DMII with renal manifestation: she has been taking Metformin daily and denies side effects,including no diarrhea,shedoes not have a blood sugar meter, he denies hypoglycemic episode. Her hgbA1C was 5.7% She denies polyphagia, polyuria or polydipsia.Last urine micro negative but she has a history of microalbuminuria that resolved withARB. Eye exam is up to date. HgbA1C today was5.8%  Hypercalcemia: she agrees on having repeat labs today, denies taking otc supplementation, denies cramping or heartburn. No history of kidney stones   HTN: she has been taking her medications as prescribed and denies side effects. Denies orthostatic changes, palpitationor chest pain.BP today is at goal, she does not have a bp monitor at home   Hyperlipidemia: Taking Lipitor and denies side effects. No myalgia.Reviewed labs with patient today   Obesity: She is still using portion control, but weight was down 13 lbs, and now only 4 lbs lower than one year ago. Discussed increase in physical activity   OA knee: she states her knees no longer bothering her since she stopped working.She dances at home for physical activity and goes for walks with her husband about once a week  Patient Active Problem List   Diagnosis Date Noted  . Acanthosis nigricans 11/14/2014  . Benign hypertension 11/14/2014  . Dyslipidemia 11/14/2014  . Gastro-esophageal reflux disease without esophagitis 11/14/2014  . Extreme obesity 11/14/2014  . Primary osteoarthritis of both knees 11/14/2014  . Microalbuminuria 10/15/2012  . Controlled type 2 diabetes mellitus with microalbuminuria ([place]) 06/05/2009  . Body dermatophytosis 12/13/2008    Past Surgical History:  Procedure Laterality Date  .  COLONOSCOPY WITH PROPOFOL N/A 11/30/2014   Procedure: COLONOSCOPY WITH PROPOFOL;  Surgeon: Lucilla Lame, MD;  Location: [facility];  Service: Endoscopy;  Laterality: N/A;  WITH BIOPSY-- TRANSVERSE COLON POLYP  X 2 DESCENING COLON POLYP  . GALLBLADDER SURGERY    . SEPTOPLASTY    . tonsillectomy    . TUBAL LIGATION      Family History  Problem Relation Age of Onset  . Colon cancer Mother   . Lung cancer Father   . Hypertension Sister   . Hypertension Sister     Social History   Socioeconomic History  . Marital status: Married    Spouse name: [name]   . Number of children: 2  . Years of education: Not on file  . Highest education level: 12th grade  Occupational History  . Occupation: retired  Scientific laboratory technician  . Financial resource strain: Not hard at all  . Food insecurity    Worry: Never true    Inability: Never true  . Transportation needs    Medical: No    Non-medical: No  Tobacco Use  . Smoking status: Never Smoker  . Smokeless tobacco: Never Used  Substance and Sexual Activity  . Alcohol use: No    Alcohol/week: 0.0 standard drinks  . Drug use: No  . Sexual activity: Yes    Partners: Male  Lifestyle  . Physical activity    Days per week: 3 days    Minutes per session: 20 min  . Stress: Not at all  Relationships  . Social connections    Talks on phone: More than three times a week  Gets together: More than three times a week    Attends religious service: More than 4 times per year    Active member of club or organization: Yes    Attends meetings of clubs or organizations: More than 4 times per year    Relationship status: Married  . Intimate partner violence    Fear of current or ex partner: No    Emotionally abused: No    Physically abused: No    Forced sexual activity: No  Other Topics Concern  . 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    She dances for  physical activity   She has two grandchildren, one is in college     Current Outpatient Medications:  .  acetaminophen (TYLENOL) 500 MG tablet, Take by mouth., Disp: , Rfl:  .  aspirin EC 81 MG tablet, Take by mouth., Disp: , Rfl:  .  Cholecalciferol (VITAMIN D3) 2000 UNITS capsule, VITAMIN D3, 2000UNIT (Oral Capsule)  2 po qday for 0 days  Quantity: 60.00;  Refills: 0   Ordered :15-May-2010  Steele Sizer MD;  Buddy Duty 05-October-2008 Active, Disp: , Rfl:  .  loratadine (CLARITIN) 10 MG tablet, Take 1 tablet (10 mg total) by mouth daily., Disp: 30 tablet, Rfl: 0 .  amLODipine (NORVASC) 10 MG tablet, Take 1 tablet (10 mg total) by mouth daily., Disp: 90 tablet, Rfl: 1 .  atorvastatin (LIPITOR) 40 MG tablet, Take 1 tablet (40 mg total) by mouth daily., Disp: 90 tablet, Rfl: 1 .  fluticasone (FLONASE) 50 MCG/ACT nasal spray, Place 2 sprays into both nostrils daily., Disp: 48 g, Rfl: 1 .  metFORMIN (GLUCOPHAGE) 500 MG tablet, Take 1 tablet (500 mg total) by mouth every evening., Disp: 90 tablet, Rfl: 1 .  telmisartan-hydrochlorothiazide (MICARDIS HCT) 80-25 MG tablet, Take 1 tablet by mouth daily., Disp: 90 tablet, Rfl: 1  Allergies  Allergen Reactions  . Oxycodone Other (See Comments)  . Oxycodone Hcl     insomnia, agitation    I personally reviewed active problem list, medication list, allergies, family history, social history, health maintenance with the patient/caregiver today.   ROS  Constitutional: Negative for fever or weight change.  Respiratory: Negative for cough and shortness of breath.   Cardiovascular: Negative for chest pain or palpitations.  Gastrointestinal: Negative for abdominal pain, no bowel changes.  Musculoskeletal: Positive  for intermittent gait problem but no  joint swelling.  Skin: Negative for rash.  Neurological: Negative for dizziness or headache.  No other specific complaints in a complete review of systems (except as listed in HPI  above).  Objective  Vitals:   02/16/19 0750  BP: 130/72  Pulse: (!) 101  Resp: 16  Temp: (!) 97.3 F (36.3 C)  TempSrc: Temporal  SpO2: 97%  Weight: 209 lb 12.8 oz (95.2 kg)  Height: 4\' 11"  (1.499 m)    Body mass index is 42.37 kg/m.  Physical Exam  Constitutional: Patient appears well-developed and well-nourished. Obese  No distress.  HEENT: head atraumatic, normocephalic, pupils equal and reactive to light Cardiovascular: Normal rate, regular rhythm and normal heart sounds.  No murmur heard. No BLE edema. Pulmonary/Chest: Effort normal and breath sounds normal. No respiratory distress. Abdominal: Soft.  There is no tenderness. Psychiatric: Patient has a normal mood and affect. behavior is normal. Judgment and thought content normal. Muscular Skeletal: crepitus with extension of both knees   Recent  Results (from the past 2160 hour(s))  HM DIABETES EYE EXAM     Status: None   Collection Time: 11/25/18 12:00 AM  Result Value Ref Range   HM Diabetic Eye Exam No Retinopathy No Retinopathy    Comment: Dr. Jomarie Longs, O.D.   HM DIABETES EYE EXAM     Status: None   Collection Time: 01/18/19 12:00 AM  Result Value Ref Range   HM Diabetic Eye Exam No Retinopathy No Retinopathy    Comment: Dr. Matilde Sprang    Diabetic Foot Exam: Diabetic Foot Exam - Simple   Simple Foot Form Diabetic Foot exam was performed with the following findings: Yes 02/16/2019  8:01 AM  Visual Inspection No deformities, no ulcerations, no other skin breakdown bilaterally: Yes Sensation Testing Intact to touch and monofilament testing bilaterally: Yes Pulse Check Posterior Tibialis and Dorsalis pulse intact bilaterally: Yes Comments      PHQ2/9: Depression screen [facility] 2/9 02/16/2019 11/16/2018 06/16/2018 03/09/2018 02/12/2018  Decreased Interest 0 0 0 0 0  Down, Depressed, Hopeless 0 0 0 0 0  PHQ - 2 Score 0 0 0 0 0  Altered sleeping 0 0 - - 0  Tired, decreased energy 0 0 - - 0  Change in appetite 0  0 - - 0  Feeling bad or failure about yourself  0 0 - - 0  Trouble concentrating 0 0 - - 0  Moving slowly or fidgety/restless 0 0 - - 0  Suicidal thoughts 0 0 - - 0  PHQ-9 Score 0 0 - - -  Difficult doing work/chores - - - - Not difficult at all    phq 9 is negative   Fall Risk: Fall Risk  02/16/2019 11/16/2018 06/16/2018 03/09/2018 02/12/2018  Falls in the past year? 0 0 0 0 No  Number falls in past yr: 0 0 - - -  Injury with Fall? 0 0 - - -     Functional Status Survey: Is the patient deaf or have difficulty hearing?: No Does the patient have difficulty seeing, even when wearing glasses/contacts?: No Does the patient have difficulty concentrating, remembering, or making decisions?: Yes Does the patient have difficulty walking or climbing stairs?: No Does the patient have difficulty dressing or bathing?: No Does the patient have difficulty doing errands alone such as visiting a doctor's office or shopping?: No   Assessment & Plan  1. Controlled type 2 diabetes mellitus with stage 2 chronic kidney disease, without long-term current use of insulin (HCC)  - POCT HgB A1C - metFORMIN (GLUCOPHAGE) 500 MG tablet; Take 1 tablet (500 mg total) by mouth every evening.  Dispense: 90 tablet; Refill: 1 - telmisartan-hydrochlorothiazide (MICARDIS HCT) 80-25 MG tablet; Take 1 tablet by mouth daily.  Dispense: 90 tablet; Refill: 1  2. Other seasonal allergic rhinitis  - fluticasone (FLONASE) 50 MCG/ACT nasal spray; Place 2 sprays into both nostrils daily.  Dispense: 48 g; Refill: 1  3. Benign hypertension  - amLODipine (NORVASC) 10 MG tablet; Take 1 tablet (10 mg total) by mouth daily.  Dispense: 90 tablet; Refill: 1 - telmisartan-hydrochlorothiazide (MICARDIS HCT) 80-25 MG tablet; Take 1 tablet by mouth daily.  Dispense: 90 tablet; Refill: 1  4. Dyslipidemia  - atorvastatin (LIPITOR) 40 MG tablet; Take 1 tablet (40 mg total) by mouth daily.  Dispense: 90 tablet; Refill: 1

## 2019-02-17 LAB — PTH, INTACT AND CALCIUM
Calcium: 10.3 mg/dL (ref 8.6–10.4)
PTH: 34 pg/mL (ref 14–64)

## 2019-03-04 ENCOUNTER — Other Ambulatory Visit: Payer: Self-pay

## 2019-03-04 ENCOUNTER — Ambulatory Visit: Payer: Medicare Other

## 2019-03-15 ENCOUNTER — Other Ambulatory Visit: Payer: Self-pay

## 2019-03-15 ENCOUNTER — Other Ambulatory Visit: Payer: Self-pay | Admitting: Family Medicine

## 2019-03-15 ENCOUNTER — Ambulatory Visit (INDEPENDENT_AMBULATORY_CARE_PROVIDER_SITE_OTHER): Payer: Medicare Other

## 2019-03-15 VITALS — BP 120/78 | HR 74 | Temp 96.9°F | Resp 16 | Ht 59.0 in | Wt 209.7 lb

## 2019-03-15 DIAGNOSIS — Z1231 Encounter for screening mammogram for malignant neoplasm of breast: Secondary | ICD-10-CM | POA: Diagnosis not present

## 2019-03-15 DIAGNOSIS — Z0000 Encounter for general adult medical examination without abnormal findings: Secondary | ICD-10-CM | POA: Diagnosis not present

## 2019-03-15 DIAGNOSIS — Z780 Asymptomatic menopausal state: Secondary | ICD-10-CM

## 2019-03-15 DIAGNOSIS — R7989 Other specified abnormal findings of blood chemistry: Secondary | ICD-10-CM

## 2019-03-15 NOTE — Patient Instructions (Signed)
Yvonne Curtis , Thank you for taking time to come for your Medicare Wellness Visit. I appreciate your ongoing commitment to your health goals. Please review the following plan we discussed and let me know if I can assist you in the future.   Screening recommendations/referrals: Colonoscopy: done 11/30/14. Repeat in 2021. Mammogram: done 12/23/17. Please call [phone] to schedule your mammogram and bone density screening.  Bone Density: done 06/01/17 Recommended yearly ophthalmology/optometry visit for glaucoma screening and checkup Recommended yearly dental visit for hygiene and checkup  Vaccinations: Influenza vaccine: done 02/16/19 Pneumococcal vaccine: done 02/20/16 Tdap vaccine: done 06/26/10 Shingles vaccine: Shingrix discussed. Please contact your pharmacy for coverage information.     Conditions/risks identified: Recommend continuing to stay healthy and active.   Next appointment: Please follow up in one year for your Medicare Annual Wellness visit.     Preventive Care 58 Years and Older, Female Preventive care refers to lifestyle choices and visits with your health care provider that can promote health and wellness. What does preventive care include?  A yearly physical exam. This is also called an annual well check.  Dental exams once or twice a year.  Routine eye exams. Ask your health care provider how often you should have your eyes checked.  Personal lifestyle choices, including:  Daily care of your teeth and gums.  Regular physical activity.  Eating a healthy diet.  Avoiding tobacco and drug use.  Limiting alcohol use.  Practicing safe sex.  Taking low-dose aspirin every day.  Taking vitamin and mineral supplements as recommended by your health care provider. What happens during an annual well check? The services and screenings done by your health care provider during your annual well check will depend on your age, overall health, lifestyle risk factors,  and family history of disease. Counseling  Your health care provider may ask you questions about your:  Alcohol use.  Tobacco use.  Drug use.  Emotional well-being.  Home and relationship well-being.  Sexual activity.  Eating habits.  History of falls.  Memory and ability to understand (cognition).  Work and work Statistician.  Reproductive health. Screening  You may have the following tests or measurements:  Height, weight, and BMI.  Blood pressure.  Lipid and cholesterol levels. These may be checked every 5 years, or more frequently if you are over 71 years old.  Skin check.  Lung cancer screening. You may have this screening every year starting at age 83 if you have a 30-pack-year history of smoking and currently smoke or have quit within the past 15 years.  Fecal occult blood test (FOBT) of the stool. You may have this test every year starting at age 29.  Flexible sigmoidoscopy or colonoscopy. You may have a sigmoidoscopy every 5 years or a colonoscopy every 10 years starting at age 63.  Hepatitis C blood test.  Hepatitis B blood test.  Sexually transmitted disease (STD) testing.  Diabetes screening. This is done by checking your blood sugar (glucose) after you have not eaten for a while (fasting). You may have this done every 1-3 years.  Bone density scan. This is done to screen for osteoporosis. You may have this done starting at age 28.  Mammogram. This may be done every 1-2 years. Talk to your health care provider about how often you should have regular mammograms. Talk with your health care provider about your test results, treatment options, and if necessary, the need for more tests. Vaccines  Your health care provider may recommend certain  vaccines, such as:  Influenza vaccine. This is recommended every year.  Tetanus, diphtheria, and acellular pertussis (Tdap, Td) vaccine. You may need a Td booster every 10 years.  Zoster vaccine. You may need  this after age 57.  Pneumococcal 13-valent conjugate (PCV13) vaccine. One dose is recommended after age 19.  Pneumococcal polysaccharide (PPSV23) vaccine. One dose is recommended after age 67. Talk to your health care provider about which screenings and vaccines you need and how often you need them. This information is not intended to replace advice given to you by your health care provider. Make sure you discuss any questions you have with your health care provider. Document Released: 05/18/2015 Document Revised: 01/09/2016 Document Reviewed: 02/20/2015 Elsevier Interactive Patient Education  2017 [place] Prevention in the Home Falls can cause injuries. They can happen to people of all ages. There are many things you can do to make your home safe and to help prevent falls. What can I do on the outside of my home?  Regularly fix the edges of walkways and driveways and fix any cracks.  Remove anything that might make you trip as you walk through a door, such as a raised step or threshold.  Trim any bushes or trees on the path to your home.  Use bright outdoor lighting.  Clear any walking paths of anything that might make someone trip, such as rocks or tools.  Regularly check to see if handrails are loose or broken. Make sure that both sides of any steps have handrails.  Any raised decks and porches should have guardrails on the edges.  Have any leaves, snow, or ice cleared regularly.  Use sand or salt on walking paths during winter.  Clean up any spills in your garage right away. This includes oil or grease spills. What can I do in the bathroom?  Use night lights.  Install grab bars by the toilet and in the tub and shower. Do not use towel bars as grab bars.  Use non-skid mats or decals in the tub or shower.  If you need to sit down in the shower, use a plastic, non-slip stool.  Keep the floor dry. Clean up any water that spills on the floor as soon as it  happens.  Remove soap buildup in the tub or shower regularly.  Attach bath mats securely with double-sided non-slip rug tape.  Do not have throw rugs and other things on the floor that can make you trip. What can I do in the bedroom?  Use night lights.  Make sure that you have a light by your bed that is easy to reach.  Do not use any sheets or blankets that are too big for your bed. They should not hang down onto the floor.  Have a firm chair that has side arms. You can use this for support while you get dressed.  Do not have throw rugs and other things on the floor that can make you trip. What can I do in the kitchen?  Clean up any spills right away.  Avoid walking on wet floors.  Keep items that you use a lot in easy-to-reach places.  If you need to reach something above you, use a strong step stool that has a grab bar.  Keep electrical cords out of the way.  Do not use floor polish or wax that makes floors slippery. If you must use wax, use non-skid floor wax.  Do not have throw rugs and other things  on the floor that can make you trip. What can I do with my stairs?  Do not leave any items on the stairs.  Make sure that there are handrails on both sides of the stairs and use them. Fix handrails that are broken or loose. Make sure that handrails are as long as the stairways.  Check any carpeting to make sure that it is firmly attached to the stairs. Fix any carpet that is loose or worn.  Avoid having throw rugs at the top or bottom of the stairs. If you do have throw rugs, attach them to the floor with carpet tape.  Make sure that you have a light switch at the top of the stairs and the bottom of the stairs. If you do not have them, ask someone to add them for you. What else can I do to help prevent falls?  Wear shoes that:  Do not have high heels.  Have rubber bottoms.  Are comfortable and fit you well.  Are closed at the toe. Do not wear sandals.  If you  use a stepladder:  Make sure that it is fully opened. Do not climb a closed stepladder.  Make sure that both sides of the stepladder are locked into place.  Ask someone to hold it for you, if possible.  Clearly mark and make sure that you can see:  Any grab bars or handrails.  First and last steps.  Where the edge of each step is.  Use tools that help you move around (mobility aids) if they are needed. These include:  Canes.  Walkers.  Scooters.  Crutches.  Turn on the lights when you go into a dark area. Replace any light bulbs as soon as they burn out.  Set up your furniture so you have a clear path. Avoid moving your furniture around.  If any of your floors are uneven, fix them.  If there are any pets around you, be aware of where they are.  Review your medicines with your doctor. Some medicines can make you feel dizzy. This can increase your chance of falling. Ask your doctor what other things that you can do to help prevent falls. This information is not intended to replace advice given to you by your health care provider. Make sure you discuss any questions you have with your health care provider. Document Released: 02/15/2009 Document Revised: 09/27/2015 Document Reviewed: 05/26/2014 Elsevier Interactive Patient Education  2017 [employer].

## 2019-03-15 NOTE — Progress Notes (Addendum)
Subjective:   Yvonne Curtis is a 68 y.o. female who presents for Medicare Annual (Subsequent) preventive examination.  Review of Systems:   Cardiac Risk Factors include: advanced age (>81men, >3 women);diabetes mellitus;dyslipidemia;hypertension;obesity (BMI >30kg/m2)     Objective:     Vitals: BP 120/78 (BP Location: Right Arm, Patient Position: Sitting, Cuff Size: Large)   Pulse 74   Temp (!) 96.9 F (36.1 C) (Temporal)   Resp 16   Ht 4\' 11"  (1.499 m)   Wt 209 lb 11.2 oz (95.1 kg)   SpO2 99%   BMI 42.35 kg/m   Body mass index is 42.35 kg/m.  Advanced Directives 03/15/2019 03/09/2018 03/04/2017 03/04/2017 12/04/2016 05/27/2016 01/22/2016  Does Patient Have a Medical Advance Directive? Yes Yes Yes Yes Yes No No  Type of Paramedic of [place];Living will [place];Living will Healthcare Power of Attorney Living will Living will;Healthcare Power of Attorney - -  Does patient want to make changes to medical advance directive? - No - Patient declined Yes (Inpatient - patient defers changing a medical advance directive at this time) Yes (Inpatient - patient defers changing a medical advance directive at this time) - - -  Copy of [place] in Chart? Yes - validated most recent copy scanned in chart (See row information) Yes Yes - Yes - -  Would patient like information on creating a medical advance directive? - - - - - - No - patient declined information    Tobacco Social History   Tobacco Use  Smoking Status Never Smoker  Smokeless Tobacco Never Used     Counseling given: Not Answered   Clinical Intake:  Pre-visit preparation completed: Yes  Pain : No/denies pain     BMI - recorded: 42.35 Nutritional Status: BMI > 30  Obese Nutritional Risks: None Diabetes: Yes CBG done?: No CBG resulted in Enter/ Edit results?: No Did pt. bring in CBG monitor from home?: No   Nutrition Risk Assessment:  Has the  patient had any N/V/D within the last 2 months?  No  Does the patient have any non-healing wounds?  No  Has the patient had any unintentional weight loss or weight gain?  No   Diabetes:  Is the patient diabetic?  Yes  If diabetic, was a CBG obtained today?  No  Did the patient bring in their glucometer from home?  No  How often do you monitor your CBG's? Pt does not actively check blood sugar.   Financial Strains and Diabetes Management:  Are you having any financial strains with the device, your supplies or your medication? No .  Does the patient want to be seen by Chronic Care Management for management of their diabetes?  Yes  - already enrolled Would the patient like to be referred to a Nutritionist or for Diabetic Management?  No   Diabetic Exams:  Diabetic Eye Exam: Completed 01/18/19 negative retinopathy.   Diabetic Foot Exam: Completed 02/16/19.   How often do you need to have someone help you when you read instructions, pamphlets, or other written materials from your doctor or pharmacy?: 1 - Never  Interpreter Needed?: No  Information entered by :: Clemetine Marker LPN  Past Medical History:  Diagnosis Date  . Arthritis    knees  . Cataract 11/25/2017   Nuclear - OU  . Diabetes mellitus without complication ([place])   . Hyperlipidemia   . Hyperopia, bilateral 11/25/2017   [place]  . Hypertension   .  Obesity   . Patient is Jehovah's Witness    declines blood products  . Wears dentures    full upper   Past Surgical History:  Procedure Laterality Date  . COLONOSCOPY WITH PROPOFOL N/A 11/30/2014   Procedure: COLONOSCOPY WITH PROPOFOL;  Surgeon: Lucilla Lame, MD;  Location: [place];  Service: Endoscopy;  Laterality: N/A;  WITH BIOPSY-- TRANSVERSE COLON POLYP  X 2 DESCENING COLON POLYP  . GALLBLADDER SURGERY    . SEPTOPLASTY    . tonsillectomy    . TUBAL LIGATION     Family History  Problem Relation Age of Onset  . Colon cancer Mother   . Lung  cancer Father   . Hypertension Sister   . Hypertension Sister    Social History   Socioeconomic History  . Marital status: Married    Spouse name: [name]   . Number of children: 2  . Years of education: Not on file  . Highest education level: 12th grade  Occupational History  . Occupation: retired  Scientific laboratory technician  . Financial resource strain: Not hard at all  . Food insecurity    Worry: Never true    Inability: Never true  . Transportation needs    Medical: No    Non-medical: No  Tobacco Use  . Smoking status: Never Smoker  . Smokeless tobacco: Never Used  Substance and Sexual Activity  . Alcohol use: No    Alcohol/week: 0.0 standard drinks  . Drug use: No  . Sexual activity: Yes    Partners: Male  Lifestyle  . Physical activity    Days per week: 3 days    Minutes per session: 20 min  . Stress: Not at all  Relationships  . Social connections    Talks on phone: More than three times a week    Gets together: More than three times a week    Attends religious service: More than 4 times per year    Active member of club or organization: Yes    Attends meetings of clubs or organizations: More than 4 times per year    Relationship status: Married  Other Topics Concern  . 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    She dances for physical activity   She has two grandchildren, one is in college    Outpatient Encounter Medications as of 03/15/2019  Medication Sig  . acetaminophen (TYLENOL) 500 MG tablet Take by mouth.  Marland Kitchen amLODipine (NORVASC) 10 MG tablet Take 1 tablet (10 mg total) by mouth daily.  Marland Kitchen aspirin EC 81 MG tablet Take by mouth.  Marland Kitchen atorvastatin (LIPITOR) 40 MG tablet Take 1 tablet (40 mg total) by mouth daily.  . Cholecalciferol (VITAMIN D3) 2000 UNITS capsule VITAMIN D3, 2000UNIT (Oral Capsule)  2 po qday for 0 days  Quantity: 60.00;  Refills: 0   Ordered :15-May-2010   Steele Sizer MD;  Buddy Duty 05-October-2008 Active  . fluticasone (FLONASE) 50 MCG/ACT nasal spray Place 2 sprays into both nostrils daily.  Marland Kitchen loratadine (CLARITIN) 10 MG tablet Take 1 tablet (10 mg total) by mouth daily. (Patient taking differently: Take 10 mg by mouth daily as needed. )  . metFORMIN (GLUCOPHAGE) 500 MG tablet Take 1 tablet (500 mg total) by mouth every evening.  Marland Kitchen telmisartan-hydrochlorothiazide (MICARDIS HCT) 80-25 MG tablet Take 1 tablet by mouth daily.   No facility-administered encounter medications on file as  of 03/15/2019.     Activities of Daily Living In your present state of health, do you have any difficulty performing the following activities: 03/15/2019 02/16/2019  Hearing? N N  Comment declines hearing aids -  Vision? N N  Comment - -  Difficulty concentrating or making decisions? Tempie Donning  Walking or climbing stairs? N N  Dressing or bathing? N N  Doing errands, shopping? N N  Preparing Food and eating ? N -  Using the Toilet? N -  In the past six months, have you accidently leaked urine? N -  Do you have problems with loss of bowel control? N -  Managing your Medications? N -  Managing your Finances? N -  Housekeeping or managing your Housekeeping? N -  Some recent data might be hidden    Patient Care Team: Steele Sizer, MD as PCP - General (Family Medicine) Idelle Leech, OD as Consulting Physician (Optometry) Mardene Celeste., MD as Consulting Physician (Dentistry) Lucilla Lame, MD as Consulting Physician (Gastroenterology) Yolonda Kida, MD as Consulting Physician (Cardiology) Cathi Roan, [facility] (Pharmacist)    Assessment:   This is a routine wellness examination for Jorryn.  Exercise Activities and Dietary recommendations Current Exercise Habits: Home exercise routine, Type of exercise: Other - see comments(chair dancing), Time (Minutes): 20, Frequency (Times/Week): 3, Weekly Exercise (Minutes/Week): 60, Intensity: Moderate,  Exercise limited by: None identified  Goals    . DIET - INCREASE WATER INTAKE     Recommend drinking 6-8 glasses of water per day     . I want to keep doing well (pt-stated)     Current Barriers:  . Adherence barrier with nationwide ARB shortages  Pharmacist Clinical Goal(s):  Marland Kitchen Over the next 90 days, patient will demonstrate Improved medication adherence as evidenced by pharmacy fill history  Interventions: . Comprehensive medication review performed. Updated medication list in EMR . Counseled patient on BP monitoring at home, importance of taking amlodipine and telmisartan-HCTZ daily for BP control . Education on [place]  Patient Self Care Activities:  . Self administers medications as prescribed . Calls provider office for new concerns or questions . Obtain a home BP cuff through [facility] OTC catalog   Initial goal documentation        Fall Risk Fall Risk  03/15/2019 02/16/2019 11/16/2018 06/16/2018 03/09/2018  Falls in the past year? 0 0 0 0 0  Number falls in past yr: 0 0 0 - -  Injury with Fall? 0 0 0 - -  Follow up Falls prevention discussed - - - -   FALL RISK PREVENTION PERTAINING TO THE HOME:  Any stairs in or around the home? Yes If so, do they handrails? Yes  Home free of loose throw rugs in walkways, pet beds, electrical cords, etc? Yes  Adequate lighting in your home to reduce risk of falls? Yes   ASSISTIVE DEVICES UTILIZED TO PREVENT FALLS:  Life alert? No  Use of a cane, walker or w/c? No  Grab bars in the bathroom? Yes  Shower chair or bench in shower? No  Elevated toilet seat or a handicapped toilet? No   DME ORDERS:  DME order needed?  No   TIMED UP AND GO:  Was the test performed? Yes .  Length of time to ambulate 10 feet: 5 sec.   GAIT:  Appearance of gait: Gait stead-fast and without the use of an assistive device.   Education: Fall risk prevention has been discussed.  Intervention(s) required?  No   Depression Screen PHQ 2/9 Scores  03/15/2019 02/16/2019 11/16/2018 06/16/2018  PHQ - 2 Score 0 0 0 0  PHQ- 9 Score - 0 0 -     Cognitive Function     6CIT Screen 03/15/2019 03/09/2018  What Year? 0 points 0 points  What month? 3 points 0 points  What time? 0 points 0 points  Count back from 20 0 points 0 points  Months in reverse 4 points 0 points  Repeat phrase 10 points 0 points  Total Score 17 0    Immunization History  Administered Date(s) Administered  . Influenza Nasal 02/12/2018  . Influenza Split 02/23/2008  . Influenza,inj,Quad PF,6+ Mos 02/09/2019  . Influenza-Unspecified 01/25/2014, 01/23/2015, 01/30/2016, 02/06/2017, 01/28/2018  . Pneumococcal Conjugate-13 01/17/2015  . Pneumococcal Polysaccharide-23 06/26/2010, 02/20/2016  . Tdap 06/26/2010  . Zoster 03/06/2011    Qualifies for Shingles Vaccine? Yes  Zostavax completed 2012. Due for Shingrix. Education has been provided regarding the importance of this vaccine. Pt has been advised to call insurance company to determine out of pocket expense. Advised may also receive vaccine at local pharmacy or Health Dept. Verbalized acceptance and understanding.  Tdap: Up to date  Flu Vaccine: Up to date  Pneumococcal Vaccine: Up to date   Screening Tests Health Maintenance  Topic Date Due  . HEMOGLOBIN A1C  08/17/2019  . COLONOSCOPY  11/30/2019  . MAMMOGRAM  12/24/2019  . OPHTHALMOLOGY EXAM  01/18/2020  . FOOT EXAM  02/16/2020  . TETANUS/TDAP  06/26/2020  . INFLUENZA VACCINE  Completed  . DEXA SCAN  Completed  . Hepatitis C Screening  Completed  . PNA vac Low Risk Adult  Completed    Cancer Screenings:  Colorectal Screening: Completed 11/30/14. Repeat every 5 years;   Mammogram: Completed 12/23/17. Repeat every year. Ordered today. Pt provided with contact information and advised to call to schedule appt.   Bone Density: Completed 06/01/17. Results reflect NORMAL. Repeat every 2 years. Ordered today. Pt provided with contact information and  advised to call to schedule appt.   Lung Cancer Screening: (Low Dose CT Chest recommended if Age 33-80 years, 30 pack-year currently smoking OR have quit w/in 15years.) does not qualify.    Additional Screening:  Hepatitis C Screening: does qualify; Completed 01/30/12  Vision Screening: Recommended annual ophthalmology exams for early detection of glaucoma and other disorders of the eye. Is the patient up to date with their annual eye exam?  Yes  Who is the provider or what is the name of the office in which the pt attends annual eye exams? Dr. Matilde Sprang  Dental Screening: Recommended annual dental exams for proper oral hygiene  Community Resource Referral:  CRR required this visit?  No      Plan:     I have personally reviewed and addressed the Medicare Annual Wellness questionnaire and have noted the following in the patient's chart:  A. Medical and social history B. Use of alcohol, tobacco or illicit drugs  C. Current medications and supplements D. Functional ability and status E.  Nutritional status F.  Physical activity G. Advance directives H. List of other physicians I.  Hospitalizations, surgeries, and ER visits in previous 12 months J.  [place] such as hearing and vision if needed, cognitive and depression L. Referrals and appointments   In addition, I have reviewed and discussed with patient certain preventive protocols, quality metrics, and best practice recommendations. A written personalized care plan for preventive services as well as general  preventive health recommendations were provided to patient.   Signed,  Clemetine Marker, LPN Nurse Health Advisor   Nurse Notes: pt states she has trouble remembering different things but tries to read a lot to keep her mind active. She did not attempt part of the 6CIT which increased the score to 17. She denies anyone commenting on memory status and states she still drives but does not like to drive by herself often.  Advised patient to schedule follow up with Dr. Ancil Boozer and discuss at that time.

## 2019-03-29 ENCOUNTER — Ambulatory Visit: Payer: Self-pay | Admitting: Pharmacist

## 2019-03-29 DIAGNOSIS — I10 Essential (primary) hypertension: Secondary | ICD-10-CM

## 2019-03-29 NOTE — Patient Instructions (Signed)
Congratulations! You have met all case management goals! You may call the case management team at any time should you have a question or if you have new case management needs. We are happy to help you! We will let your doctor know that you have met your goals.    Thank you allowing the Chronic Care Management Team to be a part of your care!   Please call a member of the CCM (Chronic Care Management) Team with any questions or case management needs in the future:   Felecia McCray, RN, BSN Nurse Care Coordinator  [phone]  Cristine Daw, PharmD  Clinical Pharmacist  [phone]  Chrystal Land, LCSW Social Woker [phone]   

## 2019-03-29 NOTE — Chronic Care Management (AMB) (Signed)
  Chronic Care Management   Follow Up Note   03/29/2019 Name: Yvonne Curtis MRN: [id number] DOB: 1951/02/18  Subjective Yvonne Curtis is a 68 y.o. year old female who is a primary care patient of Steele Sizer, MD. The CCM clinical pharmacist following up today for 90 day follow up. HIPAA identifiers verified.   Review of patient status, including review of consultants reports, relevant laboratory and other test results, and collaboration with appropriate care team members and the patient's provider was performed as part of comprehensive patient evaluation and provision of chronic care management services.    Objective.   Outpatient Encounter Medications as of 03/29/2019  Medication Sig  . Specialty Vitamins Products (ONE-A-DAY MEMORY PO) Take 1 tablet by mouth 2 (two) times daily.  Marland Kitchen acetaminophen (TYLENOL) 500 MG tablet Take by mouth.  Marland Kitchen amLODipine (NORVASC) 10 MG tablet Take 1 tablet (10 mg total) by mouth daily.  Marland Kitchen aspirin EC 81 MG tablet Take by mouth.  Marland Kitchen atorvastatin (LIPITOR) 40 MG tablet Take 1 tablet (40 mg total) by mouth daily.  . Cholecalciferol (VITAMIN D3) 2000 UNITS capsule VITAMIN D3, 2000UNIT (Oral Capsule)  2 po qday for 0 days  Quantity: 60.00;  Refills: 0   Ordered :15-May-2010  Steele Sizer MD;  Buddy Duty 05-October-2008 Active  . fluticasone (FLONASE) 50 MCG/ACT nasal spray Place 2 sprays into both nostrils daily.  Marland Kitchen loratadine (CLARITIN) 10 MG tablet Take 1 tablet (10 mg total) by mouth daily. (Patient taking differently: Take 10 mg by mouth daily as needed. )  . metFORMIN (GLUCOPHAGE) 500 MG tablet Take 1 tablet (500 mg total) by mouth every evening.  Marland Kitchen telmisartan-hydrochlorothiazide (MICARDIS HCT) 80-25 MG tablet Take 1 tablet by mouth daily.   No facility-administered encounter medications on file as of 03/29/2019.      Goals Addressed            This Visit's Progress   . I want to keep doing well (pt-stated)       Current Barriers:  .  Adherence barrier with nationwide ARB shortages  Pharmacist Clinical Goal(s):  Marland Kitchen Over the next 90 days, patient will demonstrate Improved medication adherence as evidenced by pharmacy fill history  Interventions: . Comprehensive medication review performed. Updated medication list in EMR . Counseled patient on BP monitoring at home, importance of taking amlodipine and telmisartan-HCTZ daily for BP control . Education on [employer] . Updated 11/24: patient reports filling pill box weekly, has not been without medications; taking new memory support vitamin twice daily; reviewed other memory strategies: puzzles, games, reading  Patient Self Care Activities:  . Self administers medications as prescribed . Calls provider office for new concerns or questions . Obtain a home BP cuff through [facility] OTC catalog   Please see past updates related to this goal by clicking on the "Past Updates" button in the selected goal         Plan The patient has been provided with contact information for the care management team and has been advised to call with any health related questions or concerns.    Ruben Reason, PharmD Clinical Pharmacist [facility] [phone]

## 2019-05-31 ENCOUNTER — Ambulatory Visit: Payer: BLUE CROSS/BLUE SHIELD | Attending: Internal Medicine

## 2019-05-31 DIAGNOSIS — Z20822 Contact with and (suspected) exposure to covid-19: Secondary | ICD-10-CM

## 2019-06-01 LAB — NOVEL CORONAVIRUS, NAA: SARS-CoV-2, NAA: NOT DETECTED

## 2019-06-09 ENCOUNTER — Other Ambulatory Visit: Payer: BLUE CROSS/BLUE SHIELD

## 2019-06-17 ENCOUNTER — Other Ambulatory Visit: Payer: Self-pay

## 2019-06-17 ENCOUNTER — Ambulatory Visit: Payer: BLUE CROSS/BLUE SHIELD | Attending: Internal Medicine

## 2019-06-17 DIAGNOSIS — Z23 Encounter for immunization: Secondary | ICD-10-CM | POA: Insufficient documentation

## 2019-06-17 NOTE — Progress Notes (Signed)
   Covid-19 Vaccination Clinic  Name:  Yvonne Curtis    MRN: [id number] DOB: 30-Jun-1950  06/17/2019  Yvonne Curtis was observed post Covid-19 immunization for 15 minutes without incidence. She was provided with Vaccine Information Sheet and instruction to access the V-Safe system.   Yvonne Curtis was instructed to call 911 with any severe reactions post vaccine: Marland Kitchen Difficulty breathing  . Swelling of your face and throat  . A fast heartbeat  . A bad rash all over your body  . Dizziness and weakness    Immunizations Administered    Name Date Dose VIS Date Route   Moderna COVID-19 Vaccine 06/17/2019  1:09 PM 0.5 mL 04/05/2019 Intramuscular   Manufacturer: Moderna   Lot: [id number]   [id number]

## 2019-06-30 LAB — HM DIABETES EYE EXAM

## 2019-07-05 ENCOUNTER — Encounter: Payer: Self-pay | Admitting: Family Medicine

## 2019-07-18 ENCOUNTER — Ambulatory Visit: Payer: BLUE CROSS/BLUE SHIELD | Attending: Internal Medicine

## 2019-07-18 DIAGNOSIS — Z23 Encounter for immunization: Secondary | ICD-10-CM

## 2019-07-18 NOTE — Progress Notes (Signed)
   Covid-19 Vaccination Clinic  Name:  Yvonne Curtis    MRN: [id number] DOB: [birth year]  07/18/2019  Yvonne Curtis was observed post Covid-19 immunization for 15 minutes without incident. She was provided with Vaccine Information Sheet and instruction to access the V-Safe system.   Yvonne Curtis was instructed to call 911 with any severe reactions post vaccine: Marland Kitchen Difficulty breathing  . Swelling of face and throat  . A fast heartbeat  . A bad rash all over body  . Dizziness and weakness   Immunizations Administered    Name Date Dose VIS Date Route   Moderna COVID-19 Vaccine 07/18/2019 12:44 PM 0.5 mL 04/05/2019 Intramuscular   Manufacturer: Moderna   Lot: [id number]   [id number]

## 2019-08-09 ENCOUNTER — Encounter: Payer: Self-pay | Admitting: Family Medicine

## 2019-08-09 ENCOUNTER — Ambulatory Visit (INDEPENDENT_AMBULATORY_CARE_PROVIDER_SITE_OTHER): Payer: Medicare Other | Admitting: Family Medicine

## 2019-08-09 ENCOUNTER — Other Ambulatory Visit: Payer: Self-pay

## 2019-08-09 VITALS — BP 154/90 | HR 107 | Temp 97.5°F | Resp 16 | Ht 59.0 in | Wt 216.6 lb

## 2019-08-09 DIAGNOSIS — Z79899 Other long term (current) drug therapy: Secondary | ICD-10-CM | POA: Diagnosis not present

## 2019-08-09 DIAGNOSIS — R7989 Other specified abnormal findings of blood chemistry: Secondary | ICD-10-CM | POA: Diagnosis not present

## 2019-08-09 DIAGNOSIS — E559 Vitamin D deficiency, unspecified: Secondary | ICD-10-CM

## 2019-08-09 DIAGNOSIS — F09 Unspecified mental disorder due to known physiological condition: Secondary | ICD-10-CM | POA: Diagnosis not present

## 2019-08-09 DIAGNOSIS — E785 Hyperlipidemia, unspecified: Secondary | ICD-10-CM

## 2019-08-09 DIAGNOSIS — N182 Chronic kidney disease, stage 2 (mild): Secondary | ICD-10-CM

## 2019-08-09 DIAGNOSIS — E1122 Type 2 diabetes mellitus with diabetic chronic kidney disease: Secondary | ICD-10-CM | POA: Diagnosis not present

## 2019-08-09 DIAGNOSIS — I10 Essential (primary) hypertension: Secondary | ICD-10-CM | POA: Diagnosis not present

## 2019-08-09 DIAGNOSIS — F419 Anxiety disorder, unspecified: Secondary | ICD-10-CM

## 2019-08-09 DIAGNOSIS — M170 Bilateral primary osteoarthritis of knee: Secondary | ICD-10-CM

## 2019-08-09 LAB — POCT GLYCOSYLATED HEMOGLOBIN (HGB A1C): HbA1c, POC (controlled diabetic range): 6.1 % (ref 0.0–7.0)

## 2019-08-09 MED ORDER — METFORMIN HCL 500 MG PO TABS: 500.0000 mg | ORAL_TABLET | Freq: Every evening | ORAL | 1 refills | Status: DC

## 2019-08-09 MED ORDER — ATORVASTATIN CALCIUM 40 MG PO TABS: 40.0000 mg | ORAL_TABLET | Freq: Every day | ORAL | 1 refills | Status: DC

## 2019-08-09 MED ORDER — DONEPEZIL HCL 10 MG PO TABS: 10.0000 mg | ORAL_TABLET | Freq: Every day | ORAL | 0 refills | Status: DC

## 2019-08-09 MED ORDER — AMLODIPINE BESYLATE 10 MG PO TABS: 10.0000 mg | ORAL_TABLET | Freq: Every day | ORAL | 1 refills | Status: DC

## 2019-08-09 MED ORDER — TELMISARTAN-HCTZ 80-25 MG PO TABS: 1.0000 | ORAL_TABLET | Freq: Every day | ORAL | 1 refills | Status: DC

## 2019-08-09 NOTE — Progress Notes (Addendum)
Name: Yvonne Curtis   MRN: [id number]    DOB: 1951-05-05   Date:08/09/2019       Progress Note  Subjective  Chief Complaint  Chief Complaint  Patient presents with  . Medication Refill    Amlodipine and Temisartan-HCTZ  . Diabetes  . Hypertension  . Dyslipidemia  . Memory Loss    Has been taking memory supplements and they are not working. Husband has concerns.  . Hypercalcemia    HPI  DMII with renal manifestation: she has been taking Metformin daily and denies side effects,including no diarrhea,shedoes not have a blood sugar meter, she denies hypoglycemic episode. Her hgbA1C was 5.7% today it was  6.1%  She denies polyphagia, polyuria or polydipsia.Last urine micro negative but she has a history of microalbuminuria that resolved withARB.Eye exam is up to date. Recheck urine micro   Hypercalcemia: she agrees on having repeat labs today, denies taking otc supplementation, denies cramping or heartburn. No history of kidney stones Last ptw was normal   HTN: she states getting forgetful and has not been checking her bp daily. Husband will start to remind her, bp is high today.   Hyperlipidemia: Taking Lipitor and denies side effects. No myalgia.  Obesity: Kennedy Bucker lost 13 lbs but gained it back since last visit . It was down to 209 lbs October 2020 and is now 216 lbs. She will try to dance more   OA knee: she states her knees no longer bothering her since she stopped working.She dances at home for physical activity and will start walking with her husband in the mornings.   Mild cognitive dysfunction: husband came in with her today. They are worried about her memory. She is still able to cook but forgets to turn on the stove, gets distracted, sometimes forgets how to put a blouse on ( what is the front or the back) , she has not been driving much because she does not recall how to get to places. No family history of dementia. She has been forgetting to take medications. She  does not get upset when husband re-directs her. Symptoms started gradually about 2-3 years ago but getting worse recently    Patient Active Problem List   Diagnosis Date Noted  . Acanthosis nigricans 11/14/2014  . Benign hypertension 11/14/2014  . Dyslipidemia 11/14/2014  . Gastro-esophageal reflux disease without esophagitis 11/14/2014  . Extreme obesity 11/14/2014  . Primary osteoarthritis of both knees 11/14/2014  . Microalbuminuria 10/15/2012  . Controlled type 2 diabetes mellitus with microalbuminuria ([place]) 06/05/2009  . Body dermatophytosis 12/13/2008    Past Surgical History:  Procedure Laterality Date  . COLONOSCOPY WITH PROPOFOL N/A 11/30/2014   Procedure: COLONOSCOPY WITH PROPOFOL;  Surgeon: Lucilla Lame, MD;  Location: [facility];  Service: Endoscopy;  Laterality: N/A;  WITH BIOPSY-- TRANSVERSE COLON POLYP  X 2 DESCENING COLON POLYP  . GALLBLADDER SURGERY    . SEPTOPLASTY    . tonsillectomy    . TUBAL LIGATION      Family History  Problem Relation Age of Onset  . Colon cancer Mother   . Lung cancer Father   . Hypertension Sister   . Hypertension Sister     Social History   Tobacco Use  . Smoking status: Never Smoker  . Smokeless tobacco: Never Used  Substance Use Topics  . Alcohol use: No    Alcohol/week: 0.0 standard drinks     Current Outpatient Medications:  .  acetaminophen (TYLENOL) 500 MG tablet, Take by mouth.,  Disp: , Rfl:  .  amLODipine (NORVASC) 10 MG tablet, Take 1 tablet (10 mg total) by mouth daily., Disp: 90 tablet, Rfl: 1 .  aspirin EC 81 MG tablet, Take by mouth., Disp: , Rfl:  .  atorvastatin (LIPITOR) 40 MG tablet, Take 1 tablet (40 mg total) by mouth daily., Disp: 90 tablet, Rfl: 1 .  Cholecalciferol (VITAMIN D3) 2000 UNITS capsule, VITAMIN D3, 2000UNIT (Oral Capsule)  2 po qday for 0 days  Quantity: 60.00;  Refills: 0   Ordered :15-May-2010  Steele Sizer MD;  Buddy Duty 05-October-2008 Active, Disp: , Rfl:  .  fluticasone  (FLONASE) 50 MCG/ACT nasal spray, Place 2 sprays into both nostrils daily., Disp: 48 g, Rfl: 1 .  loratadine (CLARITIN) 10 MG tablet, Take 1 tablet (10 mg total) by mouth daily. (Patient taking differently: Take 10 mg by mouth daily as needed. ), Disp: 30 tablet, Rfl: 0 .  metFORMIN (GLUCOPHAGE) 500 MG tablet, Take 1 tablet (500 mg total) by mouth every evening., Disp: 90 tablet, Rfl: 1 .  Specialty Vitamins Products (ONE-A-DAY MEMORY PO), Take 1 tablet by mouth 2 (two) times daily., Disp: , Rfl:  .  telmisartan-hydrochlorothiazide (MICARDIS HCT) 80-25 MG tablet, Take 1 tablet by mouth daily., Disp: 90 tablet, Rfl: 1  Allergies  Allergen Reactions  . Oxycodone Other (See Comments)  . Oxycodone Hcl     insomnia, agitation    I personally reviewed active problem list, medication list, allergies, family history, social history, health maintenance with the patient/caregiver today.   ROS  Constitutional: Negative for fever , positive for weight change.  Respiratory: Negative for cough and shortness of breath.   Cardiovascular: Negative for chest pain or palpitations.  Gastrointestinal: Negative for abdominal pain, no bowel changes.  Musculoskeletal: Negative for gait problem or joint swelling.  Skin: Negative for rash.  Neurological: Negative for dizziness or headache.  No other specific complaints in a complete review of systems (except as listed in HPI above).  Objective  Vitals:   08/09/19 1407 08/09/19 1439 08/09/19 1440  BP: (!) 160/90 (!) 150/90 (!) 160/90  Pulse: (!) 107    Resp: 16    Temp: (!) 97.5 F (36.4 C)    TempSrc: Temporal    SpO2: 96%    Weight: 216 lb 9.6 oz (98.2 kg)    Height: 4\' 11"  (1.499 m)      Body mass index is 43.75 kg/m.  Physical Exam  Constitutional: Patient appears well-developed and well-nourished. Obese  No distress.  HEENT: head atraumatic, normocephalic, pupils equal and reactive to light Cardiovascular: Normal rate, regular rhythm and  normal heart sounds.  No murmur heard. No BLE edema. Pulmonary/Chest: Effort normal and breath sounds normal. No respiratory distress. Abdominal: Soft.  There is no tenderness. Psychiatric: Patient has a normal mood and affect. behavior is normal. Friendly, her shirt tag was in the front   MMSE - Mini Mental State Exam 08/09/2019  Orientation to time 3  Orientation to Place 5  Registration 3  Attention/ Calculation 0  Recall 3  Language- name 2 objects 2  Language- repeat 1  Language- follow 3 step command 3  Language- read & follow direction 1  Write a sentence 1  Copy design 0  Total score 22     Recent Results (from the past 2160 hour(s))  Novel Coronavirus, NAA ([facility])     Status: None   Collection Time: 05/31/19  3:20 PM   Specimen: Nasopharyngeal(NP) swabs in vial transport medium  NASOPHARYNGE  TESTING  Result Value Ref Range   SARS-CoV-2, NAA Not Detected Not Detected    Comment: This nucleic acid amplification test was developed and its performance characteristics determined by [employer]. Nucleic acid amplification tests include RT-PCR and TMA. This test has not been FDA cleared or approved. This test has been authorized by FDA under an Emergency Use Authorization (EUA). This test is only authorized for the duration of time the declaration that circumstances exist justifying the authorization of the emergency use of in vitro diagnostic tests for detection of SARS-CoV-2 virus and/or diagnosis of COVID-19 infection under section 564(b)(1) of the Act, 21 U.S.C. PT:2852782) (1), unless the authorization is terminated or revoked sooner. When diagnostic testing is negative, the possibility of a false negative result should be considered in the context of a patient's recent exposures and the presence of clinical signs and symptoms consistent with COVID-19. An individual without symptoms of COVID-19 and who is not shedding SARS-CoV-2 virus wo uld expect to have  a negative (not detected) result in this assay.   HM DIABETES EYE EXAM     Status: None   Collection Time: 06/30/19 12:00 AM  Result Value Ref Range   HM Diabetic Eye Exam No Retinopathy No Retinopathy    Comment: [facility]     PHQ2/9: Depression screen [facility] 2/9 08/09/2019 03/15/2019 02/16/2019 11/16/2018 06/16/2018  Decreased Interest 0 0 0 0 0  Down, Depressed, Hopeless 0 0 0 0 0  PHQ - 2 Score 0 0 0 0 0  Altered sleeping 0 - 0 0 -  Tired, decreased energy 0 - 0 0 -  Change in appetite 0 - 0 0 -  Feeling bad or failure about yourself  0 - 0 0 -  Trouble concentrating 0 - 0 0 -  Moving slowly or fidgety/restless 0 - 0 0 -  Suicidal thoughts 0 - 0 0 -  PHQ-9 Score 0 - 0 0 -  Difficult doing work/chores - - - - -    phq 9 is negative   Fall Risk: Fall Risk  08/09/2019 03/15/2019 02/16/2019 11/16/2018 06/16/2018  Falls in the past year? 0 0 0 0 0  Number falls in past yr: 0 0 0 0 -  Injury with Fall? 0 0 0 0 -  Follow up - Falls prevention discussed - - -     Functional Status Survey: Is the patient deaf or have difficulty hearing?: No Does the patient have difficulty seeing, even when wearing glasses/contacts?: No Does the patient have difficulty concentrating, remembering, or making decisions?: Yes Does the patient have difficulty walking or climbing stairs?: No Does the patient have difficulty dressing or bathing?: No Does the patient have difficulty doing errands alone such as visiting a doctor's office or shopping?: No   Assessment & Plan  1. Controlled type 2 diabetes mellitus with stage 2 chronic kidney disease, without long-term current use of insulin (HCC)  - Urine Microalbumin w/creat. ratio - POCT HgB A1C - metFORMIN (GLUCOPHAGE) 500 MG tablet; Take 1 tablet (500 mg total) by mouth every evening.  Dispense: 90 tablet; Refill: 1 - telmisartan-hydrochlorothiazide (MICARDIS HCT) 80-25 MG tablet; Take 1 tablet by mouth daily.  Dispense: 90 tablet;  Refill: 1  2. Cognitive dysfunction, acquired  - TSH - CBC with Differential/Platelet - COMPLETE METABOLIC PANEL WITH GFR - RPR - Vitamin B12 - donepezil (ARICEPT) 10 MG tablet; Take 1 tablet (10 mg total) by mouth at bedtime.  Dispense: 90 tablet;  Refill: 0  3. Benign hypertension  - CBC with Differential/Platelet - COMPLETE METABOLIC PANEL WITH GFR - telmisartan-hydrochlorothiazide (MICARDIS HCT) 80-25 MG tablet; Take 1 tablet by mouth daily.  Dispense: 90 tablet; Refill: 1 - amLODipine (NORVASC) 10 MG tablet; Take 1 tablet (10 mg total) by mouth daily.  Dispense: 90 tablet; Refill: 1  4. Dyslipidemia  - atorvastatin (LIPITOR) 40 MG tablet; Take 1 tablet (40 mg total) by mouth daily.  Dispense: 90 tablet; Refill: 1  5. Primary osteoarthritis of both knees   6. Vitamin D deficiency   7. Hypercalcemia  Last pth was normal   8. Abnormal TSH  - TSH  9. Morbid obesity ([place])  Discussed with the patient the risk posed by an increased BMI. Discussed importance of portion control, calorie counting and at least 150 minutes of physical activity weekly. Avoid sweet beverages and drink more water. Eat at least 6 servings of fruit and vegetables daily   10. Anxiety  She worries all the time, but not sad. Explained being worried can also affect memory, but she is having executive function problems, so we will start with Aricept

## 2019-08-10 ENCOUNTER — Other Ambulatory Visit: Payer: Self-pay | Admitting: Family Medicine

## 2019-08-10 DIAGNOSIS — N182 Chronic kidney disease, stage 2 (mild): Secondary | ICD-10-CM

## 2019-08-10 DIAGNOSIS — E1122 Type 2 diabetes mellitus with diabetic chronic kidney disease: Secondary | ICD-10-CM

## 2019-08-10 LAB — COMPLETE METABOLIC PANEL WITH GFR
AG Ratio: 1.6 (calc) (ref 1.0–2.5)
ALT: 15 U/L (ref 6–29)
AST: 16 U/L (ref 10–35)
Albumin: 4.3 g/dL (ref 3.6–5.1)
Alkaline phosphatase (APISO): 73 U/L (ref 37–153)
BUN/Creatinine Ratio: 11 (calc) (ref 6–22)
BUN: 12 mg/dL (ref 7–25)
CO2: 28 mmol/L (ref 20–32)
Calcium: 10.4 mg/dL (ref 8.6–10.4)
Chloride: 103 mmol/L (ref 98–110)
Creat: 1.05 mg/dL — ABNORMAL HIGH (ref 0.50–0.99)
GFR, Est African American: 63 mL/min/{1.73_m2} (ref >=60)
GFR, Est Non African American: 54 mL/min/{1.73_m2} — ABNORMAL LOW (ref >=60)
Globulin: 2.7 g/dL (calc) (ref 1.9–3.7)
Glucose, Bld: 90 mg/dL (ref 65–99)
Potassium: 4.6 mmol/L (ref 3.5–5.3)
Sodium: 139 mmol/L (ref 135–146)
Total Bilirubin: 0.4 mg/dL (ref 0.2–1.2)
Total Protein: 7 g/dL (ref 6.1–8.1)

## 2019-08-10 LAB — CBC WITH DIFFERENTIAL/PLATELET
Absolute Monocytes: 684 cells/uL (ref 200–950)
Basophils Absolute: 57 cells/uL (ref 0–200)
Basophils Relative: 0.6 %
Eosinophils Absolute: 209 cells/uL (ref 15–500)
Eosinophils Relative: 2.2 %
HCT: 39.6 % (ref 35.0–45.0)
Hemoglobin: 12.9 g/dL (ref 11.7–15.5)
Lymphs Abs: 1891 cells/uL (ref 850–3900)
MCH: 27.8 pg (ref 27.0–33.0)
MCHC: 32.6 g/dL (ref 32.0–36.0)
MCV: 85.3 fL (ref 80.0–100.0)
MPV: 10.8 fL (ref 7.5–12.5)
Monocytes Relative: 7.2 %
Neutro Abs: 6660 cells/uL (ref 1500–7800)
Neutrophils Relative %: 70.1 %
Platelets: 357 10*3/uL (ref 140–400)
RBC: 4.64 10*6/uL (ref 3.80–5.10)
RDW: 14.2 % (ref 11.0–15.0)
Total Lymphocyte: 19.9 %
WBC: 9.5 10*3/uL (ref 3.8–10.8)

## 2019-08-10 LAB — TSH: TSH: 0.72 mIU/L (ref 0.40–4.50)

## 2019-08-10 LAB — MICROALBUMIN / CREATININE URINE RATIO
Creatinine, Urine: 81 mg/dL (ref 20–275)
Microalb, Ur: <0.2 mg/dL

## 2019-08-10 LAB — RPR: RPR Ser Ql: NONREACTIVE

## 2019-08-10 LAB — VITAMIN B12: Vitamin B-12: 507 pg/mL (ref 200–1100)

## 2019-08-10 MED ORDER — METFORMIN HCL 500 MG PO TABS: 500.0000 mg | ORAL_TABLET | Freq: Every evening | ORAL | 1 refills | Status: DC

## 2019-08-15 ENCOUNTER — Telehealth: Payer: Self-pay

## 2019-08-15 NOTE — Telephone Encounter (Signed)
Copied from [place] [id number]. Topic: General - Inquiry >> Aug 15, 2019 10:54 AM Richardo Priest, NT wrote: Reason for CRM: Patient called in stating she would like to speak with PCP's nurse in regards to recent medication given, donepezil (ARICEPT) 10 MG tablet. Patient would like to discuss with nurse as she states the medication gave her a sick nauseous feeling.

## 2019-08-16 ENCOUNTER — Other Ambulatory Visit: Payer: Self-pay | Admitting: Family Medicine

## 2019-08-16 DIAGNOSIS — F09 Unspecified mental disorder due to known physiological condition: Secondary | ICD-10-CM

## 2019-08-16 MED ORDER — DONEPEZIL HCL 5 MG PO TABS: 5.0000 mg | ORAL_TABLET | Freq: Every day | ORAL | 0 refills | Status: DC

## 2019-08-16 NOTE — Telephone Encounter (Signed)
Spoke with patient and she states she has tried taking the Aricept with food, however, she still feels queasy and would like a lower dosage of the medication.

## 2019-08-26 DIAGNOSIS — H2513 Age-related nuclear cataract, bilateral: Secondary | ICD-10-CM | POA: Diagnosis not present

## 2019-09-09 ENCOUNTER — Ambulatory Visit: Payer: Medicare Other | Admitting: Family Medicine

## 2019-09-13 ENCOUNTER — Ambulatory Visit (INDEPENDENT_AMBULATORY_CARE_PROVIDER_SITE_OTHER): Payer: Medicare Other | Admitting: Family Medicine

## 2019-09-13 ENCOUNTER — Other Ambulatory Visit: Payer: Self-pay

## 2019-09-13 ENCOUNTER — Encounter: Payer: Self-pay | Admitting: Family Medicine

## 2019-09-13 VITALS — BP 130/76 | HR 100 | Temp 97.9°F | Resp 16 | Ht 59.0 in | Wt 209.0 lb

## 2019-09-13 DIAGNOSIS — F09 Unspecified mental disorder due to known physiological condition: Secondary | ICD-10-CM

## 2019-09-13 NOTE — Progress Notes (Signed)
Name: Yvonne Curtis   MRN: [id number]    DOB: July 28, 1950   Date:09/13/2019       Progress Note  Subjective  Chief Complaint  Chief Complaint  Patient presents with  . Follow-up    6 week recheck momory    HPI  Cognitive dysfunction: she came in with her husband today. She had a gradual but rapid decline of her cognition over the past 8 months but significant worse over the past few months. She got worried when she started to have difficulty buttoning her shirts, afraid to drive because of fear of getting lost, also sometimes husband has to point out her clothes/shirts are backwards. We did a MMS on her last visit and it was 22. She had to stop medication due to side effects and is now only taking Prevagin otc. She denies any symptoms of stroke, she denies weakness, loss of balance or problems walking. She asks husbands same questions frequently. She denies headaches or double vision. She is willing to see neurologist now.   Patient Active Problem List   Diagnosis Date Noted  . Acanthosis nigricans 11/14/2014  . Benign hypertension 11/14/2014  . Dyslipidemia 11/14/2014  . Gastro-esophageal reflux disease without esophagitis 11/14/2014  . Extreme obesity 11/14/2014  . Primary osteoarthritis of both knees 11/14/2014  . Microalbuminuria 10/15/2012  . Controlled type 2 diabetes mellitus with microalbuminuria ([place]) 06/05/2009  . Body dermatophytosis 12/13/2008    Past Surgical History:  Procedure Laterality Date  . COLONOSCOPY WITH PROPOFOL N/A 11/30/2014   Procedure: COLONOSCOPY WITH PROPOFOL;  Surgeon: Lucilla Lame, MD;  Location: [place];  Service: Endoscopy;  Laterality: N/A;  WITH BIOPSY-- TRANSVERSE COLON POLYP  X 2 DESCENING COLON POLYP  . GALLBLADDER SURGERY    . SEPTOPLASTY    . tonsillectomy    . TUBAL LIGATION      Family History  Problem Relation Age of Onset  . Colon cancer Mother   . Lung cancer Father   . Hypertension Sister   . Hypertension Sister      Social History   Tobacco Use  . Smoking status: Never Smoker  . Smokeless tobacco: Never Used  Substance Use Topics  . Alcohol use: No    Alcohol/week: 0.0 standard drinks     Current Outpatient Medications:  .  acetaminophen (TYLENOL) 500 MG tablet, Take by mouth., Disp: , Rfl:  .  amLODipine (NORVASC) 10 MG tablet, Take 1 tablet (10 mg total) by mouth daily., Disp: 90 tablet, Rfl: 1 .  aspirin EC 81 MG tablet, Take by mouth., Disp: , Rfl:  .  atorvastatin (LIPITOR) 40 MG tablet, Take 1 tablet (40 mg total) by mouth daily., Disp: 90 tablet, Rfl: 1 .  Cholecalciferol (VITAMIN D3) 2000 UNITS capsule, VITAMIN D3, 2000UNIT (Oral Capsule)  2 po qday for 0 days  Quantity: 60.00;  Refills: 0   Ordered :15-May-2010  Steele Sizer MD;  Buddy Duty 05-October-2008 Active, Disp: , Rfl:  .  donepezil (ARICEPT) 5 MG tablet, Take 1 tablet (5 mg total) by mouth at bedtime., Disp: 90 tablet, Rfl: 0 .  fluticasone (FLONASE) 50 MCG/ACT nasal spray, Place 2 sprays into both nostrils daily., Disp: 48 g, Rfl: 1 .  loratadine (CLARITIN) 10 MG tablet, Take 1 tablet (10 mg total) by mouth daily. (Patient taking differently: Take 10 mg by mouth daily as needed. ), Disp: 30 tablet, Rfl: 0 .  metFORMIN (GLUCOPHAGE) 500 MG tablet, Take 1 tablet (500 mg total) by mouth every evening., Disp:  90 tablet, Rfl: 1 .  Specialty Vitamins Products (ONE-A-DAY MEMORY PO), Take 1 tablet by mouth 2 (two) times daily., Disp: , Rfl:  .  telmisartan-hydrochlorothiazide (MICARDIS HCT) 80-25 MG tablet, Take 1 tablet by mouth daily., Disp: 90 tablet, Rfl: 1  Allergies  Allergen Reactions  . Oxycodone Other (See Comments)  . Oxycodone Hcl     insomnia, agitation    I personally reviewed active problem list, medication list, allergies, family history, social history, health maintenance with the patient/caregiver today.   ROS  Constitutional: Negative for fever or weight change.  Respiratory: Negative for cough and shortness of  breath.   Cardiovascular: Negative for chest pain or palpitations.  Gastrointestinal: Negative for abdominal pain, no bowel changes.  Musculoskeletal: Negative for gait problem or joint swelling.  Skin: Negative for rash.  Neurological: Negative for dizziness or headache.  No other specific complaints in a complete review of systems (except as listed in HPI above).  Objective  Vitals:   09/13/19 1129  BP: 130/76  Pulse: 100  Resp: 16  Temp: 97.9 F (36.6 C)  TempSrc: Temporal  SpO2: 98%  Weight: 209 lb (94.8 kg)  Height: 4\' 11"  (1.499 m)    Body mass index is 42.21 kg/m.  Physical Exam  Constitutional: Patient appears well-developed and well-nourished. Obese  No distress.  HEENT: head atraumatic, normocephalic, pupils equal and reactive to light Cardiovascular: Normal rate, regular rhythm and normal heart sounds.  No murmur heard. No BLE edema. Pulmonary/Chest: Effort normal and breath sounds normal. No respiratory distress. Abdominal: Soft.  There is no tenderness. Psychiatric: Patient has a normal mood and affect. behavior is normal. Judgment and thought content normal. Neuro: no focal findings, normal gait  Recent Results (from the past 2160 hour(s))  HM DIABETES EYE EXAM     Status: None   Collection Time: 06/30/19 12:00 AM  Result Value Ref Range   HM Diabetic Eye Exam No Retinopathy No Retinopathy    Comment: [facility]  POCT HgB A1C     Status: Normal   Collection Time: 08/09/19  3:13 PM  Result Value Ref Range   Hemoglobin A1C     HbA1c POC (<> result, manual entry)     HbA1c, POC (prediabetic range)     HbA1c, POC (controlled diabetic range) 6.1 0.0 - 7.0 %  TSH     Status: None   Collection Time: 08/09/19  3:36 PM  Result Value Ref Range   TSH 0.72 0.40 - 4.50 mIU/L  CBC with Differential/Platelet     Status: None   Collection Time: 08/09/19  3:36 PM  Result Value Ref Range   WBC 9.5 3.8 - 10.8 Thousand/uL   RBC 4.64 3.80 - 5.10  Million/uL   Hemoglobin 12.9 11.7 - 15.5 g/dL   HCT 39.6 35.0 - 45.0 %   MCV 85.3 80.0 - 100.0 fL   MCH 27.8 27.0 - 33.0 pg   MCHC 32.6 32.0 - 36.0 g/dL   RDW 14.2 11.0 - 15.0 %   Platelets 357 140 - 400 Thousand/uL   MPV 10.8 7.5 - 12.5 fL   Neutro Abs 6,660 1,500 - 7,800 cells/uL   Lymphs Abs 1,891 850 - 3,900 cells/uL   Absolute Monocytes 684 200 - 950 cells/uL   Eosinophils Absolute 209 15 - 500 cells/uL   Basophils Absolute 57 0 - 200 cells/uL   Neutrophils Relative % 70.1 %   Total Lymphocyte 19.9 %   Monocytes Relative 7.2 %  Eosinophils Relative 2.2 %   Basophils Relative 0.6 %  COMPLETE METABOLIC PANEL WITH GFR     Status: Abnormal   Collection Time: 08/09/19  3:36 PM  Result Value Ref Range   Glucose, Bld 90 65 - 99 mg/dL    Comment: .            Fasting reference interval .    BUN 12 7 - 25 mg/dL   Creat 1.05 (H) 0.50 - 0.99 mg/dL    Comment: For patients >70 years of age, the reference limit for Creatinine is approximately 13% higher for people identified as African-American. .    GFR, Est Non African American 54 (L) > OR = 60 mL/min/1.37m2   GFR, Est African American 63 > OR = 60 mL/min/1.63m2   BUN/Creatinine Ratio 11 6 - 22 (calc)   Sodium 139 135 - 146 mmol/L   Potassium 4.6 3.5 - 5.3 mmol/L   Chloride 103 98 - 110 mmol/L   CO2 28 20 - 32 mmol/L   Calcium 10.4 8.6 - 10.4 mg/dL   Total Protein 7.0 6.1 - 8.1 g/dL   Albumin 4.3 3.6 - 5.1 g/dL   Globulin 2.7 1.9 - 3.7 g/dL (calc)   AG Ratio 1.6 1.0 - 2.5 (calc)   Total Bilirubin 0.4 0.2 - 1.2 mg/dL   Alkaline phosphatase (APISO) 73 37 - 153 U/L   AST 16 10 - 35 U/L   ALT 15 6 - 29 U/L  RPR     Status: None   Collection Time: 08/09/19  3:36 PM  Result Value Ref Range   RPR Ser Ql NON-REACTIVE NON-REACTI  Vitamin B12     Status: None   Collection Time: 08/09/19  3:36 PM  Result Value Ref Range   Vitamin B-12 507 200 - 1,100 pg/mL  Microalbumin / creatinine urine ratio     Status: None   Collection  Time: 08/09/19  3:36 PM  Result Value Ref Range   Creatinine, Urine 81 20 - 275 mg/dL   Microalb, Ur <0.2 mg/dL    Comment: Reference Range Not established    Microalb Creat Ratio NOTE <30 mcg/mg creat    Comment: NOTE: The urine albumin value is less than  0.2 mg/dL therefore we are unable to calculate  excretion and/or creatinine ratio. . The ADA defines abnormalities in albumin excretion as follows: [place] Kitchen Category         Result (mcg/mg creatinine) . Normal                    <30 Microalbuminuria         30-299  Clinical albuminuria   > OR = 300 . The ADA recommends that at least two of three specimens collected within a 3-6 month period be abnormal before considering a patient to be within a diagnostic category.       PHQ2/9: Depression screen [facility] 2/9 09/13/2019 08/09/2019 03/15/2019 02/16/2019 11/16/2018  Decreased Interest 0 0 0 0 0  Down, Depressed, Hopeless 0 0 0 0 0  PHQ - 2 Score 0 0 0 0 0  Altered sleeping 0 0 - 0 0  Tired, decreased energy 0 0 - 0 0  Change in appetite 0 0 - 0 0  Feeling bad or failure about yourself  0 0 - 0 0  Trouble concentrating 0 0 - 0 0  Moving slowly or fidgety/restless 0 0 - 0 0  Suicidal thoughts 0 0 - 0 0  PHQ-9  Score 0 0 - 0 0  Difficult doing work/chores Not difficult at all - - - -    phq 9 is negative   Fall Risk: Fall Risk  08/09/2019 03/15/2019 02/16/2019 11/16/2018 06/16/2018  Falls in the past year? 0 0 0 0 0  Number falls in past yr: 0 0 0 0 -  Injury with Fall? 0 0 0 0 -  Follow up - Falls prevention discussed - - -     Functional Status Survey: Is the patient deaf or have difficulty hearing?: No Does the patient have difficulty seeing, even when wearing glasses/contacts?: No Does the patient have difficulty concentrating, remembering, or making decisions?: Yes Does the patient have difficulty walking or climbing stairs?: No Does the patient have difficulty dressing or bathing?: No Does the patient have difficulty  doing errands alone such as visiting a doctor's office or shopping?: Yes    Assessment & Plan  1. Cognitive dysfunction, acquired  - Ambulatory referral to Neurology Could not tolerate Aricept, taking Previgen, discussed with patient and husband and they agreed on seeing sub-specialist for further evaluation

## 2019-09-14 ENCOUNTER — Ambulatory Visit: Payer: Medicare Other | Admitting: Family Medicine

## 2019-09-20 ENCOUNTER — Ambulatory Visit: Payer: Medicare Other | Admitting: Family Medicine

## 2019-09-22 DIAGNOSIS — R413 Other amnesia: Secondary | ICD-10-CM | POA: Diagnosis not present

## 2019-09-23 ENCOUNTER — Other Ambulatory Visit: Payer: Self-pay | Admitting: Neurology

## 2019-09-23 DIAGNOSIS — R413 Other amnesia: Secondary | ICD-10-CM

## 2019-09-29 ENCOUNTER — Other Ambulatory Visit: Payer: Self-pay

## 2019-09-29 ENCOUNTER — Ambulatory Visit
Admission: RE | Admit: 2019-09-29 | Discharge: 2019-09-29 | Disposition: A | Discharge status: Home / Self Care | Payer: Medicare Other | Source: Ambulatory Visit | Attending: Neurology | Admitting: Neurology

## 2019-09-29 DIAGNOSIS — R413 Other amnesia: Secondary | ICD-10-CM

## 2019-10-18 ENCOUNTER — Other Ambulatory Visit: Payer: Self-pay | Admitting: Family Medicine

## 2019-10-20 ENCOUNTER — Encounter: Payer: Self-pay | Admitting: Family Medicine

## 2019-10-20 ENCOUNTER — Other Ambulatory Visit: Payer: Self-pay

## 2019-10-20 ENCOUNTER — Ambulatory Visit (INDEPENDENT_AMBULATORY_CARE_PROVIDER_SITE_OTHER): Payer: Medicare Other | Admitting: Family Medicine

## 2019-10-20 DIAGNOSIS — F418 Other specified anxiety disorders: Secondary | ICD-10-CM | POA: Diagnosis not present

## 2019-10-20 DIAGNOSIS — F09 Unspecified mental disorder due to known physiological condition: Secondary | ICD-10-CM

## 2019-10-20 DIAGNOSIS — F419 Anxiety disorder, unspecified: Secondary | ICD-10-CM

## 2019-10-20 MED ORDER — BUSPIRONE HCL 5 MG PO TABS: 5.0000 mg | ORAL_TABLET | Freq: Two times a day (BID) | ORAL | 0 refills | Status: DC | PRN

## 2019-10-20 MED ORDER — CITALOPRAM HYDROBROMIDE 10 MG PO TABS: 10.0000 mg | ORAL_TABLET | Freq: Every day | ORAL | 1 refills | Status: DC

## 2019-10-20 NOTE — Progress Notes (Signed)
Name: Yvonne Curtis   MRN: [id number]    DOB: 02-Sep-1950   Date:10/20/2019       Progress Note  Subjective  Chief Complaint  Chief Complaint  Patient presents with  . Anxiety    patient state her nerves are messing with her    I connected with  [name] on 10/20/19 at 11:40 AM EDT by telephone and verified that I am speaking with the correct person using two identifiers.  I discussed the limitations, risks, security and privacy concerns of performing an evaluation and management service by telephone and the availability of in person appointments. Staff also discussed with the patient that there may be a patient responsible charge related to this service. Patient Location: at home , husband was on background helping her with answers  Provider Location: [facility]   HPI  Irritability: she states she is very worried about her heath, not sure if her memory will improve. She has been walking with her husband, has a supportive family and has been irritable and frustrated lately. She states went to purchase bras and needed help - she states that is very frustrating. Feeling edgy and not her normal self. She denies suicidal thoughts or ideation. phq 9 and GAD 7 are positive. Discussed options, we will try low dose Citalopram and buspar prn only, she has follow up in August but will reach out to [place] sooner if needed   Cognitive dysfunction: seen by Dr. Melrose Nakayama, medication changed from Aricept to [facility], she also had a negative MRI, still not improving. She is scared.   Patient Active Problem List   Diagnosis Date Noted  . Acanthosis nigricans 11/14/2014  . Benign hypertension 11/14/2014  . Dyslipidemia 11/14/2014  . Gastro-esophageal reflux disease without esophagitis 11/14/2014  . Extreme obesity 11/14/2014  . Primary osteoarthritis of both knees 11/14/2014  . Microalbuminuria 10/15/2012  . Controlled type 2 diabetes mellitus with microalbuminuria ([place]) 06/05/2009  . Body  dermatophytosis 12/13/2008    Past Surgical History:  Procedure Laterality Date  . COLONOSCOPY WITH PROPOFOL N/A 11/30/2014   Procedure: COLONOSCOPY WITH PROPOFOL;  Surgeon: Lucilla Lame, MD;  Location: [place];  Service: Endoscopy;  Laterality: N/A;  WITH BIOPSY-- TRANSVERSE COLON POLYP  X 2 DESCENING COLON POLYP  . GALLBLADDER SURGERY    . SEPTOPLASTY    . tonsillectomy    . TUBAL LIGATION      Family History  Problem Relation Age of Onset  . Colon cancer Mother   . Lung cancer Father   . Hypertension Sister   . Hypertension Sister       Current Outpatient Medications:  .  acetaminophen (TYLENOL) 500 MG tablet, Take by mouth., Disp: , Rfl:  .  amLODipine (NORVASC) 10 MG tablet, Take 1 tablet (10 mg total) by mouth daily., Disp: 90 tablet, Rfl: 1 .  Apoaequorin 10 MG CAPS, Take 1 capsule by mouth daily., Disp: , Rfl:  .  aspirin EC 81 MG tablet, Take by mouth., Disp: , Rfl:  .  atorvastatin (LIPITOR) 40 MG tablet, Take 1 tablet (40 mg total) by mouth daily., Disp: 90 tablet, Rfl: 1 .  Cholecalciferol (VITAMIN D3) 2000 UNITS capsule, VITAMIN D3, 2000UNIT (Oral Capsule)  2 po qday for 0 days  Quantity: 60.00;  Refills: 0   Ordered :15-May-2010  Steele Sizer MD;  Buddy Duty 05-October-2008 Active, Disp: , Rfl:  .  fluticasone (FLONASE) 50 MCG/ACT nasal spray, Place 2 sprays into both nostrils daily., Disp: 48 g, Rfl:  1 .  loratadine (CLARITIN) 10 MG tablet, Take 1 tablet (10 mg total) by mouth daily. (Patient taking differently: Take 10 mg by mouth daily as needed. ), Disp: 30 tablet, Rfl: 0 .  memantine (NAMENDA) 5 MG tablet, Take by mouth., Disp: , Rfl:  .  metFORMIN (GLUCOPHAGE) 500 MG tablet, Take 1 tablet (500 mg total) by mouth every evening., Disp: 90 tablet, Rfl: 1 .  Specialty Vitamins Products (ONE-A-DAY MEMORY PO), Take 1 tablet by mouth 2 (two) times daily., Disp: , Rfl:  .  telmisartan-hydrochlorothiazide (MICARDIS HCT) 80-25 MG tablet, Take 1 tablet by mouth  daily., Disp: 90 tablet, Rfl: 1 .  busPIRone (BUSPAR) 5 MG tablet, Take 1 tablet (5 mg total) by mouth 2 (two) times daily as needed., Disp: 60 tablet, Rfl: 0 .  citalopram (CELEXA) 10 MG tablet, Take 1 tablet (10 mg total) by mouth daily., Disp: 30 tablet, Rfl: 1  Allergies  Allergen Reactions  . Oxycodone Other (See Comments)  . Oxycodone Hcl     insomnia, agitation    I personally reviewed active problem list, medication list, allergies, family history, social history with the patient/caregiver today.   ROS  Ten systems reviewed and is negative except as mentioned in HPI   Objective  Virtual encounter, vitals not obtained.  There is no height or weight on file to calculate BMI.  Physical Exam  Awake, alert and oriented   PHQ2/9: Depression screen [facility] 2/9 10/20/2019 09/13/2019 08/09/2019 03/15/2019 02/16/2019  Decreased Interest 1 0 0 0 0  Down, Depressed, Hopeless 0 0 0 0 0  PHQ - 2 Score 1 0 0 0 0  Altered sleeping 0 0 0 - 0  Tired, decreased energy 0 0 0 - 0  Change in appetite 0 0 0 - 0  Feeling bad or failure about yourself  0 0 0 - 0  Trouble concentrating 1 0 0 - 0  Moving slowly or fidgety/restless 0 0 0 - 0  Suicidal thoughts 0 0 0 - 0  PHQ-9 Score 2 0 0 - 0  Difficult doing work/chores Not difficult at all Not difficult at all - - -  Some recent data might be hidden   PHQ-2/9 Result is positive.    GAD 7 : Generalized Anxiety Score 10/20/2019  Nervous, Anxious, on Edge 3  Control/stop worrying 3  Worry too much - different things 3  Trouble relaxing 0  Restless 0  Easily annoyed or irritable 3  Afraid - awful might happen 0  Total GAD 7 Score 12  Anxiety Difficulty Very difficult    Fall Risk: Fall Risk  10/20/2019 08/09/2019 03/15/2019 02/16/2019 11/16/2018  Falls in the past year? 0 0 0 0 0  Number falls in past yr: 0 0 0 0 0  Injury with Fall? 0 0 0 0 0  Follow up Falls evaluation completed - Falls prevention discussed - -     Assessment &  Plan  1. Depression with anxiety  - citalopram (CELEXA) 10 MG tablet; Take 1 tablet (10 mg total) by mouth daily.  Dispense: 30 tablet; Refill: 1  2. Anxiety  - busPIRone (BUSPAR) 5 MG tablet; Take 1 tablet (5 mg total) by mouth 2 (two) times daily as needed.  Dispense: 60 tablet; Refill: 0  3. Cognitive dysfunction, acquired  I discussed the assessment and treatment plan with the patient. The patient was provided an opportunity to ask questions and all were answered. The patient agreed with the plan and demonstrated  an understanding of the instructions.   The patient was advised to call back or seek an in-person evaluation if the symptoms worsen or if the condition fails to improve as anticipated.  I provided 15  minutes of non-face-to-face time during this encounter.  Loistine Chance, MD

## 2019-11-02 DIAGNOSIS — R0981 Nasal congestion: Secondary | ICD-10-CM | POA: Diagnosis not present

## 2019-11-02 DIAGNOSIS — J309 Allergic rhinitis, unspecified: Secondary | ICD-10-CM | POA: Diagnosis not present

## 2019-11-03 ENCOUNTER — Encounter: Payer: Self-pay | Admitting: Family Medicine

## 2019-11-03 DIAGNOSIS — E079 Disorder of thyroid, unspecified: Secondary | ICD-10-CM | POA: Diagnosis not present

## 2019-11-03 DIAGNOSIS — J301 Allergic rhinitis due to pollen: Secondary | ICD-10-CM | POA: Diagnosis not present

## 2019-11-04 LAB — CBC AND DIFFERENTIAL
Neutrophils Absolute: 11
WBC: 12.3

## 2019-11-09 ENCOUNTER — Ambulatory Visit: Payer: Medicare Other | Admitting: Internal Medicine

## 2019-11-15 ENCOUNTER — Telehealth: Payer: Self-pay

## 2019-11-15 DIAGNOSIS — D72829 Elevated white blood cell count, unspecified: Secondary | ICD-10-CM

## 2019-11-15 NOTE — Telephone Encounter (Signed)
[facility] faxed over lab results: Patient has a mildly elevated wbc: 12.3

## 2019-11-16 DIAGNOSIS — E079 Disorder of thyroid, unspecified: Secondary | ICD-10-CM | POA: Diagnosis not present

## 2019-11-16 DIAGNOSIS — D72829 Elevated white blood cell count, unspecified: Secondary | ICD-10-CM | POA: Diagnosis not present

## 2019-11-17 LAB — CBC WITH DIFFERENTIAL/PLATELET
Basophils Absolute: 0.1 10*3/uL (ref 0.0–0.2)
Basos: 1 %
EOS (ABSOLUTE): 0.3 10*3/uL (ref 0.0–0.4)
Eos: 3 %
Hematocrit: 33.9 % — ABNORMAL LOW (ref 34.0–46.6)
Hemoglobin: 11.6 g/dL (ref 11.1–15.9)
Immature Grans (Abs): 0 10*3/uL (ref 0.0–0.1)
Immature Granulocytes: 0 %
Lymphocytes Absolute: 2.2 10*3/uL (ref 0.7–3.1)
Lymphs: 17 %
MCH: 28.2 pg (ref 26.6–33.0)
MCHC: 34.2 g/dL (ref 31.5–35.7)
MCV: 82 fL (ref 79–97)
Monocytes Absolute: 0.7 10*3/uL (ref 0.1–0.9)
Monocytes: 5 %
Neutrophils Absolute: 9.2 10*3/uL — ABNORMAL HIGH (ref 1.4–7.0)
Neutrophils: 74 %
Platelets: 373 10*3/uL (ref 150–450)
RBC: 4.12 x10E6/uL (ref 3.77–5.28)
RDW: 15.3 % (ref 11.7–15.4)
WBC: 12.4 10*3/uL — ABNORMAL HIGH (ref 3.4–10.8)

## 2019-11-17 NOTE — Telephone Encounter (Signed)
Patient called. She is feeling fine. She will call back if she starts experiencing any of those symptoms.

## 2019-11-21 ENCOUNTER — Telehealth: Payer: Self-pay | Admitting: Family Medicine

## 2019-11-21 DIAGNOSIS — J019 Acute sinusitis, unspecified: Secondary | ICD-10-CM

## 2019-11-21 NOTE — Telephone Encounter (Signed)
Pt is currently taking citalopram 10mg , she was informed that if swelling or redness in or around the eye occurs for her to contact her provider. Pt does have redness in the eye and would like to know what should she do.

## 2019-11-22 ENCOUNTER — Other Ambulatory Visit: Payer: Self-pay | Admitting: Family Medicine

## 2019-11-22 DIAGNOSIS — D72829 Elevated white blood cell count, unspecified: Secondary | ICD-10-CM

## 2019-11-22 MED ORDER — AMOXICILLIN-POT CLAVULANATE 875-125 MG PO TABS: 1.0000 | ORAL_TABLET | Freq: Two times a day (BID) | ORAL | 0 refills | Status: DC

## 2019-11-22 NOTE — Telephone Encounter (Signed)
Husband came in today. He said that she has swelling and white matter coming out of her eyes. She also has a bad taste in her mouth. I told him to have her d/c the medication until she is seen on Thursday. Please advise.

## 2019-11-23 DIAGNOSIS — R5381 Other malaise: Secondary | ICD-10-CM | POA: Diagnosis not present

## 2019-11-23 DIAGNOSIS — H5203 Hypermetropia, bilateral: Secondary | ICD-10-CM | POA: Diagnosis not present

## 2019-11-23 DIAGNOSIS — E119 Type 2 diabetes mellitus without complications: Secondary | ICD-10-CM | POA: Diagnosis not present

## 2019-11-23 DIAGNOSIS — R0981 Nasal congestion: Secondary | ICD-10-CM | POA: Diagnosis not present

## 2019-11-23 DIAGNOSIS — H2513 Age-related nuclear cataract, bilateral: Secondary | ICD-10-CM | POA: Diagnosis not present

## 2019-11-23 DIAGNOSIS — R682 Dry mouth, unspecified: Secondary | ICD-10-CM | POA: Diagnosis not present

## 2019-11-23 DIAGNOSIS — H10233 Serous conjunctivitis, except viral, bilateral: Secondary | ICD-10-CM | POA: Diagnosis not present

## 2019-11-23 DIAGNOSIS — H25013 Cortical age-related cataract, bilateral: Secondary | ICD-10-CM | POA: Diagnosis not present

## 2019-11-23 DIAGNOSIS — H109 Unspecified conjunctivitis: Secondary | ICD-10-CM | POA: Diagnosis not present

## 2019-11-24 ENCOUNTER — Ambulatory Visit (INDEPENDENT_AMBULATORY_CARE_PROVIDER_SITE_OTHER): Payer: Medicare Other | Admitting: Internal Medicine

## 2019-11-24 ENCOUNTER — Other Ambulatory Visit: Payer: Self-pay

## 2019-11-24 ENCOUNTER — Encounter: Payer: Self-pay | Admitting: Internal Medicine

## 2019-11-24 VITALS — BP 128/78 | HR 105 | Temp 97.8°F | Resp 16 | Ht 59.0 in | Wt 203.8 lb

## 2019-11-24 DIAGNOSIS — H1033 Unspecified acute conjunctivitis, bilateral: Secondary | ICD-10-CM | POA: Diagnosis not present

## 2019-11-24 NOTE — Patient Instructions (Signed)

## 2019-11-24 NOTE — Progress Notes (Signed)
Patient ID: Yvonne Curtis, female    DOB: [birth year], 69 y.o.   MRN: [id number]  PCP: Steele Sizer, MD  Chief Complaint  Patient presents with  . Corner of eyes swelling    Corners of her eyes red/swollen started a couple of weeks ago, Dr. Ancil Boozer started patient on Augmentin 11/23/2019    Subjective:   Yvonne Curtis is a 69 y.o. female, presents to clinic with CC of the following:  Chief Complaint  Patient presents with  . Corner of eyes swelling    Corners of her eyes red/swollen started a couple of weeks ago, Dr. Ancil Boozer started patient on Augmentin 11/23/2019    HPI:  Patient is an 69 year old female patient of Dr. Ancil Boozer Problem messages in the chart, Dr. Ancil Boozer had her on Augmentin for possible sinusitis on 11/22/2019. She follows up today with eye concerns as above. She is here with her significant other who she often defers to for answers to questions.  She has noted for the past week plus, that her eyes are red, and often stay red during the day, and occasionally they feel puffy.  She denies any eye pain.  She notes they often have a little whitish discharge, in the morning when she wakes up and occasionally throughout the day.  It is both eyes that are involved.  She denies any vision changes with no double vision blurred vision or loss of vision.  He does not wear contacts.  He denies any marked nasal drainage, but does note a bad taste in her mouth at times.  He denies any fevers, although question if felt warm at one point yesterday.   As I discussed next steps in management at the end of our visit, it was noted that she went to the eye doctor yesterday, and was given drops to use 4 times a day, prescription antibiotic drops and also some type of spray to use to help.  It was unclear of the details of that.  They did pick that up at the [facility] and just started using.    Patient Active Problem List   Diagnosis Date Noted  . Acanthosis nigricans  11/14/2014  . Benign hypertension 11/14/2014  . Dyslipidemia 11/14/2014  . Gastro-esophageal reflux disease without esophagitis 11/14/2014  . Extreme obesity 11/14/2014  . Primary osteoarthritis of both knees 11/14/2014  . Microalbuminuria 10/15/2012  . Controlled type 2 diabetes mellitus with microalbuminuria ([place]) 06/05/2009  . Body dermatophytosis 12/13/2008      Current Outpatient Medications:  .  acetaminophen (TYLENOL) 500 MG tablet, Take by mouth., Disp: , Rfl:  .  amLODipine (NORVASC) 10 MG tablet, Take 1 tablet (10 mg total) by mouth daily., Disp: 90 tablet, Rfl: 1 .  amoxicillin-clavulanate (AUGMENTIN) 875-125 MG tablet, Take 1 tablet by mouth 2 (two) times daily., Disp: 20 tablet, Rfl: 0 .  aspirin EC 81 MG tablet, Take by mouth., Disp: , Rfl:  .  atorvastatin (LIPITOR) 40 MG tablet, Take 1 tablet (40 mg total) by mouth daily., Disp: 90 tablet, Rfl: 1 .  busPIRone (BUSPAR) 5 MG tablet, Take 1 tablet (5 mg total) by mouth 2 (two) times daily as needed., Disp: 60 tablet, Rfl: 0 .  Cholecalciferol (VITAMIN D3) 2000 UNITS capsule, VITAMIN D3, 2000UNIT (Oral Capsule)  2 po qday for 0 days  Quantity: 60.00;  Refills: 0   Ordered :15-May-2010  Steele Sizer MD;  Buddy Duty 05-October-2008 Active, Disp: , Rfl:  .  fluticasone (FLONASE) 50 MCG/ACT nasal  spray, Place 2 sprays into both nostrils daily., Disp: 48 g, Rfl: 1 .  loratadine (CLARITIN) 10 MG tablet, Take 1 tablet (10 mg total) by mouth daily. (Patient taking differently: Take 10 mg by mouth daily as needed. ), Disp: 30 tablet, Rfl: 0 .  memantine (NAMENDA) 5 MG tablet, Take by mouth., Disp: , Rfl:  .  metFORMIN (GLUCOPHAGE) 500 MG tablet, Take 1 tablet (500 mg total) by mouth every evening., Disp: 90 tablet, Rfl: 1 .  Specialty Vitamins Products (ONE-A-DAY MEMORY PO), Take 1 tablet by mouth 2 (two) times daily., Disp: , Rfl:  .  telmisartan-hydrochlorothiazide (MICARDIS HCT) 80-25 MG tablet, Take 1 tablet by mouth daily., Disp: 90  tablet, Rfl: 1 .  citalopram (CELEXA) 10 MG tablet, Take 1 tablet (10 mg total) by mouth daily. (Patient not taking: Reported on 11/24/2019), Disp: 30 tablet, Rfl: 1   Allergies  Allergen Reactions  . Oxycodone Other (See Comments)  . Oxycodone Hcl     insomnia, agitation     Past Surgical History:  Procedure Laterality Date  . COLONOSCOPY WITH PROPOFOL N/A 11/30/2014   Procedure: COLONOSCOPY WITH PROPOFOL;  Surgeon: Lucilla Lame, MD;  Location: [place];  Service: Endoscopy;  Laterality: N/A;  WITH BIOPSY-- TRANSVERSE COLON POLYP  X 2 DESCENING COLON POLYP  . GALLBLADDER SURGERY    . SEPTOPLASTY    . tonsillectomy    . TUBAL LIGATION       Family History  Problem Relation Age of Onset  . Colon cancer Mother   . Lung cancer Father   . Hypertension Sister   . Hypertension Sister      Social History   Tobacco Use  . Smoking status: Never Smoker  . Smokeless tobacco: Never Used  Substance Use Topics  . Alcohol use: No    Alcohol/week: 0.0 standard drinks    With staff assistance, above reviewed with the patient today.  ROS: As per HPI, otherwise no specific complaints on a limited and focused system review   No results found for this or any previous visit (from the past 72 hour(s)).   PHQ2/9: Depression screen [facility] 2/9 11/24/2019 10/20/2019 09/13/2019 08/09/2019 03/15/2019  Decreased Interest 0 1 0 0 0  Down, Depressed, Hopeless 0 0 0 0 0  PHQ - 2 Score 0 1 0 0 0  Altered sleeping 0 0 0 0 -  Tired, decreased energy 0 0 0 0 -  Change in appetite 0 0 0 0 -  Feeling bad or failure about yourself  0 0 0 0 -  Trouble concentrating 1 1 0 0 -  Moving slowly or fidgety/restless 0 0 0 0 -  Suicidal thoughts 0 0 0 0 -  PHQ-9 Score 1 2 0 0 -  Difficult doing work/chores Not difficult at all Not difficult at all Not difficult at all - -  Some recent data might be hidden   PHQ-2/9 Result is neg Fall Risk: Fall Risk  11/24/2019 10/20/2019 08/09/2019 03/15/2019  02/16/2019  Falls in the past year? 0 0 0 0 0  Number falls in past yr: 0 0 0 0 0  Injury with Fall? 0 0 0 0 0  Follow up - Falls evaluation completed - Falls prevention discussed -      Objective:   Vitals:   11/24/19 1125  BP: 128/78  Pulse: 105  Resp: 16  Temp: 97.8 F (36.6 C)  TempSrc: Temporal  SpO2: 99%  Weight: (!) 203 lb 12.8 oz (  92.4 kg)  Height: 4\' 11"  (1.499 m)    Body mass index is 41.16 kg/m.  Physical Exam   NAD, masked, not ill-appearing HEENT - [place]/AT, sclera anicteric, PERRL, EOMI with no pain with extraocular motion testing, conj minimally injected bilaterally, no marked lid swelling bilateral, no periorbital tenderness bilaterally, no discharge present, no focal sinus tenderness, pharynx clear with no oral mucosal lesions.  No exudates Neck - supple, no adenopathy, no rigidity Neuro/psychiatric - affect was not flat, appropriate with conversation  Alert with normal speech  Results for orders placed or performed in visit on 11/15/19  CBC and differential  Result Value Ref Range   Neutrophils Absolute 11    WBC 12.3   CBC with Differential/Platelet  Result Value Ref Range   WBC 12.4 (H) 3.4 - 10.8 x10E3/uL   RBC 4.12 3.77 - 5.28 x10E6/uL   Hemoglobin 11.6 11.1 - 15.9 g/dL   Hematocrit 33.9 (L) 34.0 - 46.6 %   MCV 82 79 - 97 fL   MCH 28.2 26.6 - 33.0 pg   MCHC 34.2 31 - 35 g/dL   RDW 15.3 11.7 - 15.4 %   Platelets 373 150 - 450 x10E3/uL   Neutrophils 74 Not Estab. %   Lymphs 17 Not Estab. %   Monocytes 5 Not Estab. %   Eos 3 Not Estab. %   Basos 1 Not Estab. %   Neutrophils Absolute 9.2 (H) 1 - 7 x10E3/uL   Lymphocytes Absolute 2.2 0 - 3 x10E3/uL   Monocytes Absolute 0.7 0 - 0 x10E3/uL   EOS (ABSOLUTE) 0.3 0.0 - 0.4 x10E3/uL   Basophils Absolute 0.1 0 - 0 x10E3/uL   Immature Granulocytes 0 Not Estab. %   Immature Grans (Abs) 0.0 0.0 - 0.1 x10E3/uL       Assessment & Plan:   1. Acute bacterial conjunctivitis of both eyes Educated  patient, noted likely with conjunctivitis, and concerned with the whitish type discharge for potential infectious source.  I did recommend antibiotic eyedrops, and she noted they were already prescribed by the eye doctor yesterday, and she is taking them 4 times a day.  I do not have the details, although her significant other noted they did fill up the prescription at a pharmacy and it is consistent with being an antibiotic eyedrop.  Does need to continue following the recommendations from the eye doctor appointment yesterday as discussed today. Not sure she needs to continue the Augmentin at this point, although having been recommended by Dr. Ancil Boozer whom they trust, wanted to continue and will do so presently.  Emphasized if symptoms not improving or more problematic, the need to follow-up again, and they were understanding of that. They do have a follow-up in August with Dr. Ancil Boozer again as well scheduled.         Towanda Malkin, MD 11/24/19 11:56 AM

## 2019-11-28 ENCOUNTER — Encounter: Payer: Self-pay | Admitting: Family Medicine

## 2019-11-30 DIAGNOSIS — H5203 Hypermetropia, bilateral: Secondary | ICD-10-CM | POA: Diagnosis not present

## 2019-11-30 DIAGNOSIS — H2513 Age-related nuclear cataract, bilateral: Secondary | ICD-10-CM | POA: Diagnosis not present

## 2019-11-30 DIAGNOSIS — H10239 Serous conjunctivitis, except viral, unspecified eye: Secondary | ICD-10-CM | POA: Diagnosis not present

## 2019-11-30 DIAGNOSIS — E119 Type 2 diabetes mellitus without complications: Secondary | ICD-10-CM | POA: Diagnosis not present

## 2019-11-30 DIAGNOSIS — H25013 Cortical age-related cataract, bilateral: Secondary | ICD-10-CM | POA: Diagnosis not present

## 2019-12-06 DIAGNOSIS — H1033 Unspecified acute conjunctivitis, bilateral: Secondary | ICD-10-CM | POA: Diagnosis not present

## 2019-12-08 DIAGNOSIS — H1033 Unspecified acute conjunctivitis, bilateral: Secondary | ICD-10-CM | POA: Diagnosis not present

## 2019-12-14 ENCOUNTER — Ambulatory Visit: Payer: Medicare Other | Admitting: Family Medicine

## 2019-12-15 ENCOUNTER — Ambulatory Visit: Payer: Medicare Other | Admitting: Family Medicine

## 2019-12-19 DIAGNOSIS — R413 Other amnesia: Secondary | ICD-10-CM | POA: Insufficient documentation

## 2019-12-19 DIAGNOSIS — R4586 Emotional lability: Secondary | ICD-10-CM | POA: Insufficient documentation

## 2019-12-19 DIAGNOSIS — R432 Parageusia: Secondary | ICD-10-CM | POA: Diagnosis not present

## 2019-12-21 NOTE — Progress Notes (Signed)
Name: KOULA VENIER   MRN: [id number]    DOB: [birth year]   Date:12/22/2019       Progress Note  Subjective  Chief Complaint  Chief Complaint  Patient presents with   Hypertension   Gastroesophageal Reflux   Diabetes   Dyslipidemia    HPI  Memory loss:  Cognitive dysfunction started end of 2020, labs were done in our office and normal, she was referred to Dr. Melrose Nakayama because of progression of symptoms, failed MMS , she is now on Namenda and off Aricept, she I staking antidepressant and prn buspar for agitation but per husband memory is the same. She is forgetful , husband is doing most of the cooking . He needs to dispense her medications, and she questions him when she needs to take pm dose.   DMII with renal manifestation: she has been taking Metformin daily and denies side effects,including no diarrhea,shedoes not have a blood sugar meter, she denies hypoglycemic episode. HerhgbA1C was 5.7% last level was 6.1% She denies polyphagia, polyuria or polydipsia.Last urine micro negative but she has a history of microalbuminuria that resolved withARB.Eye exam is up to date. She states food is causing a salty sensation in her mouth - they discussed it with Dr. Melrose Nakayama but no answers.   Hypercalcemia:denies taking otc supplementation, denies cramping or heartburn. No history of kidney stonesLast pth was normal   HTN: she takes medications daily, bp is at goal, no chest pain or palpitation   Hyperlipidemia: Taking Lipitor and denies side effects. No myalgia.Reviewed last labs   Obesity: Kennedy Bucker lost 13 lbs but gained it back since last visit . It was down to 209 lbs October 2020 n May it was  216 lbs and today is down to 201 lbs, she is not eating as much because she states everything tasted very salty  OA knee: she states her knees no longer bothering her since she stopped working.She dances at home and has been walking with her husband and pain has improved    Patient  Active Problem List   Diagnosis Date Noted   Acanthosis nigricans 11/14/2014   Benign hypertension 11/14/2014   Dyslipidemia 11/14/2014   Gastro-esophageal reflux disease without esophagitis 11/14/2014   Extreme obesity 11/14/2014   Primary osteoarthritis of both knees 11/14/2014   Microalbuminuria 10/15/2012   Controlled type 2 diabetes mellitus with microalbuminuria ([place]) 06/05/2009   Body dermatophytosis 12/13/2008    Past Surgical History:  Procedure Laterality Date   COLONOSCOPY WITH PROPOFOL N/A 11/30/2014   Procedure: COLONOSCOPY WITH PROPOFOL;  Surgeon: Lucilla Lame, MD;  Location: [facility];  Service: Endoscopy;  Laterality: N/A;  WITH BIOPSY-- TRANSVERSE COLON POLYP  X 2 DESCENING COLON POLYP   GALLBLADDER SURGERY     SEPTOPLASTY     tonsillectomy     TUBAL LIGATION      Family History  Problem Relation Age of Onset   Colon cancer Mother    Lung cancer Father    Hypertension Sister    Hypertension Sister     Social History   Tobacco Use   Smoking status: Never Smoker   Smokeless tobacco: Never Used  Substance Use Topics   Alcohol use: No    Alcohol/week: 0.0 standard drinks     Current Outpatient Medications:    acetaminophen (TYLENOL) 500 MG tablet, Take by mouth., Disp: , Rfl:    amLODipine (NORVASC) 10 MG tablet, Take 1 tablet (10 mg total) by mouth daily., Disp: 90 tablet, Rfl: 1  amoxicillin-clavulanate (AUGMENTIN) 875-125 MG tablet, Take 1 tablet by mouth 2 (two) times daily., Disp: 20 tablet, Rfl: 0   aspirin EC 81 MG tablet, Take by mouth., Disp: , Rfl:    atorvastatin (LIPITOR) 40 MG tablet, Take 1 tablet (40 mg total) by mouth daily., Disp: 90 tablet, Rfl: 1   busPIRone (BUSPAR) 5 MG tablet, Take 1 tablet (5 mg total) by mouth 2 (two) times daily as needed., Disp: 60 tablet, Rfl: 0   Cholecalciferol (VITAMIN D3) 2000 UNITS capsule, VITAMIN D3, 2000UNIT (Oral Capsule)  2 po qday for 0 days  Quantity: 60.00;   Refills: 0   Ordered :15-May-2010  Steele Sizer MD;  Buddy Duty 05-October-2008 Active, Disp: , Rfl:    citalopram (CELEXA) 10 MG tablet, Take 1 tablet (10 mg total) by mouth daily., Disp: 30 tablet, Rfl: 1   DUREZOL 0.05 % EMUL, Apply 1 drop to eye 4 (four) times daily., Disp: , Rfl:    fluticasone (FLONASE) 50 MCG/ACT nasal spray, Place 2 sprays into both nostrils daily., Disp: 48 g, Rfl: 1   loratadine (CLARITIN) 10 MG tablet, Take 1 tablet (10 mg total) by mouth daily. (Patient taking differently: Take 10 mg by mouth daily as needed. ), Disp: 30 tablet, Rfl: 0   memantine (NAMENDA) 5 MG tablet, Take by mouth., Disp: , Rfl:    metFORMIN (GLUCOPHAGE) 500 MG tablet, Take 1 tablet (500 mg total) by mouth every evening., Disp: 90 tablet, Rfl: 1   Multiple Vitamin (MULTI-VITAMIN) tablet, Take 1 tablet by mouth daily., Disp: , Rfl:    Specialty Vitamins Products (ONE-A-DAY MEMORY PO), Take 1 tablet by mouth 2 (two) times daily., Disp: , Rfl:    telmisartan-hydrochlorothiazide (MICARDIS HCT) 80-25 MG tablet, Take 1 tablet by mouth daily., Disp: 90 tablet, Rfl: 1  Allergies  Allergen Reactions   Oxycodone Other (See Comments)   Oxycodone Hcl     insomnia, agitation    I personally reviewed active problem list, medication list, allergies, family history, social history, health maintenance with the patient/caregiver today.   ROS  Constitutional: Negative for fever or weight change.  Respiratory: Negative for cough and shortness of breath.   Cardiovascular: Negative for chest pain or palpitations.  Gastrointestinal: Negative for abdominal pain, no bowel changes.  Musculoskeletal: Negative for gait problem or joint swelling.  Skin: Negative for rash.  Neurological: Negative for dizziness or headache.  No other specific complaints in a complete review of systems (except as listed in HPI above).  Objective  Vitals:   12/22/19 1014  BP: 110/70  Pulse: 98  Resp: 16  Temp: 98.5 F  (36.9 C)  TempSrc: Oral  SpO2: 99%  Weight: 201 lb 3.2 oz (91.3 kg)  Height: 4\' 11"  (1.499 m)    Body mass index is 40.64 kg/m.  Physical Exam  Constitutional: Patient appears well-developed and well-nourished. Obese  No distress.  HEENT: head atraumatic, normocephalic, pupils equal and reactive to light,  neck supple Cardiovascular: Normal rate, regular rhythm and normal heart sounds.  No murmur heard. No BLE edema. Pulmonary/Chest: Effort normal and breath sounds normal. No respiratory distress. Abdominal: Soft.  There is no tenderness. Psychiatric: Patient has a normal mood and affect. behavior is normal, sometimes looks at her husbands for answers . Judgment and thought content normal.  Recent Results (from the past 2160 hour(s))  CBC and differential     Status: None   Collection Time: 11/04/19 12:00 AM  Result Value Ref Range   Neutrophils Absolute 11  WBC 12.3   CBC with Differential/Platelet     Status: Abnormal   Collection Time: 11/16/19  7:18 AM  Result Value Ref Range   WBC 12.4 (H) 3.4 - 10.8 x10E3/uL   RBC 4.12 3.77 - 5.28 x10E6/uL   Hemoglobin 11.6 11.1 - 15.9 g/dL   Hematocrit 33.9 (L) 34.0 - 46.6 %   MCV 82 79 - 97 fL   MCH 28.2 26.6 - 33.0 pg   MCHC 34.2 31 - 35 g/dL   RDW 15.3 11.7 - 15.4 %   Platelets 373 150 - 450 x10E3/uL   Neutrophils 74 Not Estab. %   Lymphs 17 Not Estab. %   Monocytes 5 Not Estab. %   Eos 3 Not Estab. %   Basos 1 Not Estab. %   Neutrophils Absolute 9.2 (H) 1 - 7 x10E3/uL   Lymphocytes Absolute 2.2 0 - 3 x10E3/uL   Monocytes Absolute 0.7 0 - 0 x10E3/uL   EOS (ABSOLUTE) 0.3 0.0 - 0.4 x10E3/uL   Basophils Absolute 0.1 0 - 0 x10E3/uL   Immature Granulocytes 0 Not Estab. %   Immature Grans (Abs) 0.0 0.0 - 0.1 x10E3/uL      PHQ2/9: Depression screen [facility] 2/9 12/22/2019 11/24/2019 10/20/2019 09/13/2019 08/09/2019  Decreased Interest 0 0 1 0 0  Down, Depressed, Hopeless 0 0 0 0 0  PHQ - 2 Score 0 0 1 0 0  Altered sleeping 0 0 0 0  0  Tired, decreased energy 2 0 0 0 0  Change in appetite 2 0 0 0 0  Feeling bad or failure about yourself  0 0 0 0 0  Trouble concentrating 0 1 1 0 0  Moving slowly or fidgety/restless 0 0 0 0 0  Suicidal thoughts 0 0 0 0 0  PHQ-9 Score 4 1 2  0 0  Difficult doing work/chores - Not difficult at all Not difficult at all Not difficult at all -  Some recent data might be hidden    phq 9 is negative    Fall Risk: Fall Risk  11/24/2019 10/20/2019 08/09/2019 03/15/2019 02/16/2019  Falls in the past year? 0 0 0 0 0  Number falls in past yr: 0 0 0 0 0  Injury with Fall? 0 0 0 0 0  Follow up - Falls evaluation completed - Falls prevention discussed -    Assessment & Plan  1. Controlled type 2 diabetes mellitus with stage 2 chronic kidney disease, without long-term current use of insulin (HCC)  - POCT HgB A1C - metFORMIN (GLUCOPHAGE) 500 MG tablet; Take 1 tablet (500 mg total) by mouth every evening.  Dispense: 90 tablet; Refill: 1 - telmisartan-hydrochlorothiazide (MICARDIS HCT) 80-25 MG tablet; Take 1 tablet by mouth daily.  Dispense: 90 tablet; Refill: 1 - Hemoglobin A1c  2. Benign hypertension  - telmisartan-hydrochlorothiazide (MICARDIS HCT) 80-25 MG tablet; Take 1 tablet by mouth daily.  Dispense: 90 tablet; Refill: 1 - amLODipine (NORVASC) 10 MG tablet; Take 1 tablet (10 mg total) by mouth daily.  Dispense: 90 tablet; Refill: 1  3. Depression with anxiety  - citalopram (CELEXA) 20 MG tablet; Take 1 tablet (20 mg total) by mouth daily.  Dispense: 90 tablet; Refill: 0  4. Anxiety   5. Dyslipidemia  - atorvastatin (LIPITOR) 40 MG tablet; Take 1 tablet (40 mg total) by mouth daily.  Dispense: 90 tablet; Refill: 1  6. Cognitive dysfunction, acquired   7. Taste perversion  - Ammonia - Zinc  8. Mood change  -  Zinc   9. Colon cancer screening  - Ambulatory referral to Gastroenterology

## 2019-12-22 ENCOUNTER — Encounter: Payer: Self-pay | Admitting: Family Medicine

## 2019-12-22 ENCOUNTER — Ambulatory Visit (INDEPENDENT_AMBULATORY_CARE_PROVIDER_SITE_OTHER): Payer: Medicare Other | Admitting: Family Medicine

## 2019-12-22 ENCOUNTER — Other Ambulatory Visit: Payer: Self-pay

## 2019-12-22 VITALS — BP 110/70 | HR 98 | Temp 98.5°F | Resp 16 | Ht 59.0 in | Wt 201.2 lb

## 2019-12-22 DIAGNOSIS — I10 Essential (primary) hypertension: Secondary | ICD-10-CM

## 2019-12-22 DIAGNOSIS — R432 Parageusia: Secondary | ICD-10-CM

## 2019-12-22 DIAGNOSIS — N182 Chronic kidney disease, stage 2 (mild): Secondary | ICD-10-CM

## 2019-12-22 DIAGNOSIS — F419 Anxiety disorder, unspecified: Secondary | ICD-10-CM

## 2019-12-22 DIAGNOSIS — Z1211 Encounter for screening for malignant neoplasm of colon: Secondary | ICD-10-CM

## 2019-12-22 DIAGNOSIS — F09 Unspecified mental disorder due to known physiological condition: Secondary | ICD-10-CM

## 2019-12-22 DIAGNOSIS — F418 Other specified anxiety disorders: Secondary | ICD-10-CM | POA: Diagnosis not present

## 2019-12-22 DIAGNOSIS — E1122 Type 2 diabetes mellitus with diabetic chronic kidney disease: Secondary | ICD-10-CM | POA: Diagnosis not present

## 2019-12-22 DIAGNOSIS — E785 Hyperlipidemia, unspecified: Secondary | ICD-10-CM | POA: Diagnosis not present

## 2019-12-22 DIAGNOSIS — R4586 Emotional lability: Secondary | ICD-10-CM

## 2019-12-22 MED ORDER — ATORVASTATIN CALCIUM 40 MG PO TABS: 40.0000 mg | ORAL_TABLET | Freq: Every day | ORAL | 1 refills | Status: DC

## 2019-12-22 MED ORDER — CITALOPRAM HYDROBROMIDE 20 MG PO TABS: 20.0000 mg | ORAL_TABLET | Freq: Every day | ORAL | 0 refills | Status: DC

## 2019-12-22 MED ORDER — METFORMIN HCL 500 MG PO TABS: 500.0000 mg | ORAL_TABLET | Freq: Every evening | ORAL | 1 refills | Status: DC

## 2019-12-22 MED ORDER — BLOOD GLUCOSE METER KIT: PACK | 0 refills | Status: DC

## 2019-12-22 MED ORDER — TELMISARTAN-HCTZ 80-25 MG PO TABS: 1.0000 | ORAL_TABLET | Freq: Every day | ORAL | 1 refills | Status: DC

## 2019-12-22 MED ORDER — AMLODIPINE BESYLATE 10 MG PO TABS: 10.0000 mg | ORAL_TABLET | Freq: Every day | ORAL | 1 refills | Status: DC

## 2019-12-25 LAB — HEMOGLOBIN A1C
Hgb A1c MFr Bld: 5.7 % of total Hgb — ABNORMAL HIGH (ref <5.7)
Mean Plasma Glucose: 117 (calc)
eAG (mmol/L): 6.5 (calc)

## 2019-12-25 LAB — ZINC: Zinc: 74 ug/dL (ref 60–130)

## 2019-12-25 LAB — AMMONIA: Ammonia: 37 umol/L (ref <=72)

## 2019-12-28 ENCOUNTER — Other Ambulatory Visit: Payer: Self-pay

## 2019-12-28 ENCOUNTER — Telehealth (INDEPENDENT_AMBULATORY_CARE_PROVIDER_SITE_OTHER): Payer: Self-pay | Admitting: Gastroenterology

## 2019-12-28 DIAGNOSIS — Z860100 Personal history of colon polyps, unspecified: Secondary | ICD-10-CM

## 2019-12-28 MED ORDER — NA SULFATE-K SULFATE-MG SULF 17.5-3.13-1.6 GM/177ML PO SOLN: 1.0000 | Freq: Once | ORAL | 0 refills | Status: AC

## 2019-12-28 NOTE — Progress Notes (Signed)
Gastroenterology Pre-Procedure Review  Request Date: Tuesday 01/17/20 Requesting Physician: Dr. Allen Norris  PATIENT REVIEW QUESTIONS: The patient responded to the following health history questions as indicated:    1. Are you having any GI issues? no 2. Do you have a personal history of Polyps? yes (11/30/14 colonoscopy performed by Dr. Allen Norris) 3. Do you have a family history of Colon Cancer or Polyps? yes (mother colon cancer) 4. Diabetes Mellitus? yes (type 2) 5. Joint replacements in the past 12 months?no 6. Major health problems in the past 3 months?no 7. Any artificial heart valves, MVP, or defibrillator?no    MEDICATIONS & ALLERGIES:    Patient reports the following regarding taking any anticoagulation/antiplatelet therapy:   Plavix, Coumadin, Eliquis, Xarelto, Lovenox, Pradaxa, Brilinta, or Effient? no Aspirin? yes (81 mg daily)  Patient confirms/reports the following medications:  Current Outpatient Medications  Medication Sig Dispense Refill  . acetaminophen (TYLENOL) 500 MG tablet Take by mouth.    Marland Kitchen amLODipine (NORVASC) 10 MG tablet Take 1 tablet (10 mg total) by mouth daily. 90 tablet 1  . aspirin EC 81 MG tablet Take by mouth.    Marland Kitchen atorvastatin (LIPITOR) 40 MG tablet Take 1 tablet (40 mg total) by mouth daily. 90 tablet 1  . blood glucose meter kit and supplies Dispense based on patient and insurance preference. Use once daily as directed. (FOR ICD-10 E10.9, E11.9). STrips 100 Lancets 100 1 each 0  . busPIRone (BUSPAR) 5 MG tablet Take 1 tablet (5 mg total) by mouth 2 (two) times daily as needed. 60 tablet 0  . Cholecalciferol (VITAMIN D3) 2000 UNITS capsule VITAMIN D3, 2000UNIT (Oral Capsule)  2 po qday for 0 days  Quantity: 60.00;  Refills: 0   Ordered :15-May-2010  Steele Sizer MD;  Buddy Duty 05-October-2008 Active    . citalopram (CELEXA) 20 MG tablet Take 1 tablet (20 mg total) by mouth daily. 90 tablet 0  . DUREZOL 0.05 % EMUL Apply 1 drop to eye 4 (four) times daily.     . fluticasone (FLONASE) 50 MCG/ACT nasal spray Place 2 sprays into both nostrils daily. 48 g 1  . loratadine (CLARITIN) 10 MG tablet Take 1 tablet (10 mg total) by mouth daily. (Patient taking differently: Take 10 mg by mouth daily as needed. ) 30 tablet 0  . memantine (NAMENDA) 5 MG tablet Take by mouth.    . metFORMIN (GLUCOPHAGE) 500 MG tablet Take 1 tablet (500 mg total) by mouth every evening. 90 tablet 1  . Multiple Vitamin (MULTI-VITAMIN) tablet Take 1 tablet by mouth daily.    . Na Sulfate-K Sulfate-Mg Sulf 17.5-3.13-1.6 GM/177ML SOLN Take 1 kit by mouth once for 1 dose. 354 mL 0  . Specialty Vitamins Products (ONE-A-DAY MEMORY PO) Take 1 tablet by mouth 2 (two) times daily.    Marland Kitchen telmisartan-hydrochlorothiazide (MICARDIS HCT) 80-25 MG tablet Take 1 tablet by mouth daily. 90 tablet 1   No current facility-administered medications for this visit.    Patient confirms/reports the following allergies:  Allergies  Allergen Reactions  . Oxycodone Other (See Comments)  . Oxycodone Hcl     insomnia, agitation    No orders of the defined types were placed in this encounter.   AUTHORIZATION INFORMATION Primary Insurance: 1D#: Group #:  Secondary Insurance: 1D#: Group #:  SCHEDULE INFORMATION: Date: Tues 01/17/20 Time: Location:[facility]

## 2020-01-13 ENCOUNTER — Other Ambulatory Visit
Admission: RE | Admit: 2020-01-13 | Discharge: 2020-01-13 | Disposition: A | Discharge status: Home / Self Care | Payer: Medicare Other | Source: Ambulatory Visit | Attending: Gastroenterology | Admitting: Gastroenterology

## 2020-01-13 ENCOUNTER — Other Ambulatory Visit: Payer: Self-pay

## 2020-01-13 DIAGNOSIS — Z01812 Encounter for preprocedural laboratory examination: Secondary | ICD-10-CM | POA: Diagnosis not present

## 2020-01-13 DIAGNOSIS — Z20822 Contact with and (suspected) exposure to covid-19: Secondary | ICD-10-CM | POA: Insufficient documentation

## 2020-01-13 LAB — SARS CORONAVIRUS 2 (TAT 6-24 HRS): SARS Coronavirus 2: NEGATIVE

## 2020-01-16 ENCOUNTER — Encounter: Payer: Self-pay | Admitting: Gastroenterology

## 2020-01-17 ENCOUNTER — Other Ambulatory Visit: Payer: Self-pay

## 2020-01-17 ENCOUNTER — Encounter: Payer: Self-pay | Admitting: Gastroenterology

## 2020-01-17 ENCOUNTER — Ambulatory Visit
Admission: RE | Admit: 2020-01-17 | Discharge: 2020-01-17 | Disposition: A | Discharge status: Home / Self Care | Payer: Medicare Other | Attending: Gastroenterology | Admitting: Gastroenterology

## 2020-01-17 ENCOUNTER — Ambulatory Visit: Payer: Medicare Other | Admitting: Anesthesiology

## 2020-01-17 ENCOUNTER — Encounter
Admission: RE | Disposition: A | Discharge status: Home / Self Care | Payer: Self-pay | Source: Home / Self Care | Attending: Gastroenterology

## 2020-01-17 DIAGNOSIS — G4730 Sleep apnea, unspecified: Secondary | ICD-10-CM | POA: Insufficient documentation

## 2020-01-17 DIAGNOSIS — D123 Benign neoplasm of transverse colon: Secondary | ICD-10-CM | POA: Insufficient documentation

## 2020-01-17 DIAGNOSIS — Z1211 Encounter for screening for malignant neoplasm of colon: Secondary | ICD-10-CM | POA: Diagnosis not present

## 2020-01-17 DIAGNOSIS — M170 Bilateral primary osteoarthritis of knee: Secondary | ICD-10-CM | POA: Diagnosis not present

## 2020-01-17 DIAGNOSIS — K5790 Diverticulosis of intestine, part unspecified, without perforation or abscess without bleeding: Secondary | ICD-10-CM | POA: Diagnosis not present

## 2020-01-17 DIAGNOSIS — Z86010 Personal history of colonic polyps: Secondary | ICD-10-CM | POA: Diagnosis not present

## 2020-01-17 DIAGNOSIS — Z7982 Long term (current) use of aspirin: Secondary | ICD-10-CM | POA: Insufficient documentation

## 2020-01-17 DIAGNOSIS — K640 First degree hemorrhoids: Secondary | ICD-10-CM | POA: Diagnosis not present

## 2020-01-17 DIAGNOSIS — K5730 Diverticulosis of large intestine without perforation or abscess without bleeding: Secondary | ICD-10-CM | POA: Insufficient documentation

## 2020-01-17 DIAGNOSIS — K635 Polyp of colon: Secondary | ICD-10-CM | POA: Diagnosis not present

## 2020-01-17 DIAGNOSIS — E669 Obesity, unspecified: Secondary | ICD-10-CM | POA: Diagnosis not present

## 2020-01-17 DIAGNOSIS — Z6829 Body mass index (BMI) 29.0-29.9, adult: Secondary | ICD-10-CM | POA: Insufficient documentation

## 2020-01-17 DIAGNOSIS — Z7984 Long term (current) use of oral hypoglycemic drugs: Secondary | ICD-10-CM | POA: Diagnosis not present

## 2020-01-17 DIAGNOSIS — Z79899 Other long term (current) drug therapy: Secondary | ICD-10-CM | POA: Insufficient documentation

## 2020-01-17 DIAGNOSIS — E785 Hyperlipidemia, unspecified: Secondary | ICD-10-CM | POA: Diagnosis not present

## 2020-01-17 DIAGNOSIS — E1136 Type 2 diabetes mellitus with diabetic cataract: Secondary | ICD-10-CM | POA: Insufficient documentation

## 2020-01-17 DIAGNOSIS — K649 Unspecified hemorrhoids: Secondary | ICD-10-CM | POA: Diagnosis not present

## 2020-01-17 DIAGNOSIS — I10 Essential (primary) hypertension: Secondary | ICD-10-CM | POA: Diagnosis not present

## 2020-01-17 DIAGNOSIS — Z885 Allergy status to narcotic agent status: Secondary | ICD-10-CM | POA: Insufficient documentation

## 2020-01-17 DIAGNOSIS — D126 Benign neoplasm of colon, unspecified: Secondary | ICD-10-CM | POA: Diagnosis not present

## 2020-01-17 HISTORY — PX: COLONOSCOPY WITH PROPOFOL: SHX5780

## 2020-01-17 LAB — HM COLONOSCOPY

## 2020-01-17 LAB — GLUCOSE, CAPILLARY: Glucose-Capillary: 116 mg/dL — ABNORMAL HIGH (ref 70–99)

## 2020-01-17 SURGERY — COLONOSCOPY WITH PROPOFOL
Anesthesia: General

## 2020-01-17 MED ORDER — LIDOCAINE HCL (CARDIAC) PF 100 MG/5ML IV SOSY
PREFILLED_SYRINGE | INTRAVENOUS | Status: DC | PRN
  Administered 2020-01-17: 50 mg via INTRAVENOUS

## 2020-01-17 MED ORDER — PROPOFOL 500 MG/50ML IV EMUL
INTRAVENOUS | Status: AC
  Filled 2020-01-17: qty 50

## 2020-01-17 MED ORDER — PROPOFOL 500 MG/50ML IV EMUL
INTRAVENOUS | Status: DC | PRN
  Administered 2020-01-17: 120 ug/kg/min via INTRAVENOUS

## 2020-01-17 MED ORDER — PROPOFOL 10 MG/ML IV BOLUS
INTRAVENOUS | Status: DC | PRN
  Administered 2020-01-17: 20 mg via INTRAVENOUS
  Administered 2020-01-17: 80 mg via INTRAVENOUS

## 2020-01-17 MED ORDER — SODIUM CHLORIDE 0.9 % IV SOLN: INTRAVENOUS | Status: DC

## 2020-01-17 MED ORDER — PHENYLEPHRINE HCL (PRESSORS) 10 MG/ML IV SOLN
INTRAVENOUS | Status: DC | PRN
  Administered 2020-01-17: 100 ug via INTRAVENOUS

## 2020-01-17 MED ORDER — PROPOFOL 10 MG/ML IV BOLUS
INTRAVENOUS | Status: AC
  Filled 2020-01-17: qty 20

## 2020-01-17 NOTE — Progress Notes (Signed)
   01/17/20 0750  Clinical Encounter Type  Visited With Patient and family together  Visit Type Initial  Referral From Chaplain  Consult/Referral To Chaplain  While rounding the [facility] waiting area, chaplain spoke to patient and her husband. She said everything is alright, but she is ready to eat. Chaplain notice pt's husband was wearing a air [employer] hat and she thanked him for his service. Chaplain wished pt and her husband well and left.

## 2020-01-17 NOTE — H&P (Signed)
Lucilla Lame, MD [address] [place], [place] [address] Phone:[phone] Fax : [phone]  Primary Care Physician:  Steele Sizer, MD Primary Gastroenterologist:  Dr. Allen Norris  Pre-Procedure History & Physical: HPI:  Yvonne Curtis is a 69 y.o. female is here for an colonoscopy.   Past Medical History:  Diagnosis Date  . Arthritis    knees  . Cataract 11/25/2017   Nuclear - OU  . Diabetes mellitus without complication ([employer])   . Hyperlipidemia   . Hyperopia, bilateral 11/25/2017   [place]  . Hypertension   . Obesity   . Patient is Jehovah's Witness    declines blood products  . Wears dentures    full upper    Past Surgical History:  Procedure Laterality Date  . CHOLECYSTECTOMY    . COLONOSCOPY WITH PROPOFOL N/A 11/30/2014   Procedure: COLONOSCOPY WITH PROPOFOL;  Surgeon: Lucilla Lame, MD;  Location: [facility];  Service: Endoscopy;  Laterality: N/A;  WITH BIOPSY-- TRANSVERSE COLON POLYP  X 2 DESCENING COLON POLYP  . GALLBLADDER SURGERY    . SEPTOPLASTY    . tonsillectomy    . TONSILLECTOMY    . TUBAL LIGATION      Prior to Admission medications   Medication Sig Start Date End Date Taking? Authorizing Provider  amLODipine (NORVASC) 10 MG tablet Take 1 tablet (10 mg total) by mouth daily. 12/22/19  Yes Steele Sizer, MD  aspirin EC 81 MG tablet Take by mouth.   Yes [provider]  atorvastatin (LIPITOR) 40 MG tablet Take 1 tablet (40 mg total) by mouth daily. 12/22/19  Yes Sowles, Drue Stager, MD  busPIRone (BUSPAR) 5 MG tablet Take 1 tablet (5 mg total) by mouth 2 (two) times daily as needed. 10/20/19  Yes Sowles, Drue Stager, MD  Cholecalciferol (VITAMIN D3) 2000 UNITS capsule VITAMIN D3, 2000UNIT (Oral Capsule)  2 po qday for 0 days  Quantity: 60.00;  Refills: 0   Ordered :15-May-2010  Steele Sizer MD;  Started 05-October-2008 Active 10/05/08  Yes [provider]  citalopram (CELEXA) 20 MG tablet Take 1 tablet (20 mg  total) by mouth daily. 12/22/19  Yes Sowles, Drue Stager, MD  loratadine (CLARITIN) 10 MG tablet Take 1 tablet (10 mg total) by mouth daily. Patient taking differently: Take 10 mg by mouth daily as needed.  01/22/16  Yes Sowles, Drue Stager, MD  memantine [facility]) 5 MG tablet Take by mouth. 09/26/19 09/25/20 Yes [provider]  metFORMIN (GLUCOPHAGE) 500 MG tablet Take 1 tablet (500 mg total) by mouth every evening. 12/22/19  Yes Sowles, Drue Stager, MD  Multiple Vitamin (MULTI-VITAMIN) tablet Take 1 tablet by mouth daily.   Yes [provider]  telmisartan-hydrochlorothiazide (MICARDIS HCT) 80-25 MG tablet Take 1 tablet by mouth daily. 12/22/19  Yes Sowles, Drue Stager, MD  acetaminophen (TYLENOL) 500 MG tablet Take by mouth. 10/15/12   [provider]  blood glucose meter kit and supplies Dispense based on patient and insurance preference. Use once daily as directed. (FOR ICD-10 E10.9, E11.9). STrips 100 Lancets 100 12/22/19   Sowles, Drue Stager, MD  DUREZOL 0.05 % EMUL Apply 1 drop to eye 4 (four) times daily. 12/06/19   [provider]  fluticasone (FLONASE) 50 MCG/ACT nasal spray Place 2 sprays into both nostrils daily. 02/16/19   Steele Sizer, MD  Specialty Vitamins Products (ONE-A-DAY MEMORY PO) Take 1 tablet by mouth 2 (two) times daily.    [provider]    Allergies as of 12/28/2019 - Review Complete  12/28/2019  Allergen Reaction Noted  . Oxycodone Other (See Comments) 11/17/2014  . Oxycodone hcl  11/14/2014    Family History  Problem Relation Age of Onset  . Colon cancer Mother   . Lung cancer Father   . Hypertension Sister   . Hypertension Sister     Social History   Socioeconomic History  . Marital status: Married    Spouse name: [name]   . Number of children: 2  . Years of education: Not on file  . Highest education level: 12th grade  Occupational History  . Occupation: retired  Tobacco Use  . Smoking status: Never Smoker  . Smokeless  tobacco: Never Used  Vaping Use  . Vaping Use: Never used  Substance and Sexual Activity  . Alcohol use: No    Alcohol/week: 0.0 standard drinks  . Drug use: No  . Sexual activity: Yes    Partners: Male  Other Topics Concern  . 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    She dances for physical activity   She has two grandchildren, one is in college   Social Determinants of Health   Financial Resource Strain:   . Difficulty of Paying Living Expenses: Not on file  Food Insecurity:   . Worried About Charity fundraiser in the Last Year: Not on file  . Ran Out of Food in the Last Year: Not on file  Transportation Needs:   . Lack of Transportation (Medical): Not on file  . Lack of Transportation (Non-Medical): Not on file  Physical Activity:   . Days of Exercise per Week: Not on file  . Minutes of Exercise per Session: Not on file  Stress:   . Feeling of Stress : Not on file  Social Connections:   . Frequency of Communication with Friends and Family: Not on file  . Frequency of Social Gatherings with Friends and Family: Not on file  . Attends Religious Services: Not on file  . Active Member of Clubs or Organizations: Not on file  . Attends Archivist Meetings: Not on file  . Marital Status: Not on file  Intimate Partner Violence:   . Fear of Current or Ex-Partner: Not on file  . Emotionally Abused: Not on file  . Physically Abused: Not on file  . Sexually Abused: Not on file    Review of Systems: See HPI, otherwise negative ROS  Physical Exam: BP (!) 143/86   Pulse (!) 114   Temp (!) 97.1 F (36.2 C) (Tympanic)   Resp 16   Ht $R'5\' 2"'qO$  (1.575 m)   Wt 72.6 kg   SpO2 98%   BMI 29.26 kg/m  General:   Alert,  pleasant and cooperative in NAD Head:  Normocephalic and atraumatic. Neck:  Supple; no masses or thyromegaly. Lungs:  Clear throughout to auscultation.    Heart:   Regular rate and rhythm. Abdomen:  Soft, nontender and nondistended. Normal bowel sounds, without guarding, and without rebound.   Neurologic:  Alert and  oriented x4;  grossly normal neurologically.  Impression/Plan: Yvonne Curtis is here for an colonoscopy to be performed for a history of adenomatous polyps on 11/2014  Risks, benefits, limitations, and alternatives regarding  colonoscopy have been reviewed with the patient.  Questions have been answered.  All parties agreeable.   Lucilla Lame, MD  01/17/2020, 9:07 AM

## 2020-01-17 NOTE — Transfer of Care (Signed)
Immediate Anesthesia Transfer of Care Note  Patient: Yvonne Curtis  Procedure(s) Performed: COLONOSCOPY WITH PROPOFOL (N/A )  Patient Location: Endoscopy Unit  Anesthesia Type:General  Level of Consciousness: drowsy and patient cooperative  Airway & Oxygen Therapy: Patient Spontanous Breathing  Post-op Assessment: Report given to RN and Post -op Vital signs reviewed and stable  Post vital signs: Reviewed and stable  Last Vitals:  Vitals Value Taken Time  BP 110/62 01/17/20 0940  Temp 36.2 C 01/17/20 0938  Pulse 87 01/17/20 0941  Resp 22 01/17/20 0941  SpO2 100 % 01/17/20 0941  Vitals shown include unvalidated device data.  Last Pain:  Vitals:   01/17/20 0938  TempSrc: Temporal  PainSc: 0-No pain         Complications: No complications documented.

## 2020-01-17 NOTE — Anesthesia Postprocedure Evaluation (Signed)
Anesthesia Post Note  Patient: Yvonne Curtis  Procedure(s) Performed: COLONOSCOPY WITH PROPOFOL (N/A )  Patient location during evaluation: PACU Anesthesia Type: General Level of consciousness: awake and alert Pain management: pain level controlled Vital Signs Assessment: post-procedure vital signs reviewed and stable Respiratory status: spontaneous breathing, nonlabored ventilation and respiratory function stable Cardiovascular status: blood pressure returned to baseline and stable Postop Assessment: no apparent nausea or vomiting Anesthetic complications: no   No complications documented.   Last Vitals:  Vitals:   01/17/20 0938 01/17/20 0940  BP:  110/62  Pulse:  98  Resp:  18  Temp: (!) 36.2 C   SpO2:  97%    Last Pain:  Vitals:   01/17/20 1008  TempSrc:   PainSc: 0-No pain                 Brett Canales [name]

## 2020-01-17 NOTE — Anesthesia Preprocedure Evaluation (Addendum)
Anesthesia Evaluation  Patient identified by MRN, date of birth, ID band Patient awake    Reviewed: Allergy & Precautions, H&P , NPO status , Patient's Chart, lab work & pertinent test results  History of Anesthesia Complications Negative for: history of anesthetic complications  Airway Mallampati: II  TM Distance: >3 FB Neck ROM: full    Dental  (+) Upper Dentures   Pulmonary sleep apnea (pt unsure if she has OSA or not) , neg COPD,    breath sounds clear to auscultation       Cardiovascular hypertension, (-) angina(-) Past MI and (-) Cardiac Stents (-) dysrhythmias  Rhythm:regular Rate:Tachycardia     Neuro/Psych negative neurological ROS  negative psych ROS   GI/Hepatic Neg liver ROS, GERD  Controlled,  Endo/Other  diabetesMorbid obesity  Renal/GU negative Renal ROS  negative genitourinary   Musculoskeletal   Abdominal   Peds  Hematology negative hematology ROS (+)   Anesthesia Other Findings Past Medical History: No date: Arthritis     Comment:  knees 11/25/2017: Cataract     Comment:  Nuclear - OU No date: Diabetes mellitus without complication ([place]) No date: Hyperlipidemia 11/25/2017: Hyperopia, bilateral     Comment:  [place] [address] [place] No date: Hypertension No date: Obesity No date: Patient is Sales promotion account executive Witness     Comment:  declines blood products No date: Wears dentures     Comment:  full upper  Past Surgical History: No date: CHOLECYSTECTOMY 11/30/2014: COLONOSCOPY WITH PROPOFOL; N/A     Comment:  Procedure: COLONOSCOPY WITH PROPOFOL;  Surgeon: Lucilla Lame, MD;  Location: [facility];  Service:               Endoscopy;  Laterality: N/A;  WITH BIOPSY-- TRANSVERSE               COLON POLYP  X 2 DESCENING COLON POLYP No date: GALLBLADDER SURGERY No date: SEPTOPLASTY No date: tonsillectomy No date: TONSILLECTOMY No date: TUBAL LIGATION  BMI    Body Mass Index: 29.26 kg/m       Reproductive/Obstetrics negative OB ROS                             Anesthesia Physical Anesthesia Plan  ASA: III  Anesthesia Plan: General   Post-op Pain Management:    Induction:   PONV Risk Score and Plan: Propofol infusion and TIVA  Airway Management Planned: Simple Face Mask  Additional Equipment:   Intra-op Plan:   Post-operative Plan:   Informed Consent: I have reviewed the patients History and Physical, chart, labs and discussed the procedure including the risks, benefits and alternatives for the proposed anesthesia with the patient or authorized representative who has indicated his/her understanding and acceptance.     Dental Advisory Given  Plan Discussed with: Anesthesiologist, CRNA and Surgeon  Anesthesia Plan Comments:        Anesthesia Quick Evaluation

## 2020-01-17 NOTE — Op Note (Signed)
[facility] Gastroenterology Patient Name: Yvonne Curtis Procedure Date: 01/17/2020 9:07 AM MRN: [id number] Account #: [id number] Date of Birth: 04-May-1951 Admit Type: Outpatient Age: 69 Room: [facility] ENDO ROOM 1 Gender: Female Note Status: Finalized Procedure:             Colonoscopy Indications:           High risk colon cancer surveillance: Personal history                         of colonic polyps Providers:             Lucilla Lame MD, MD Referring MD:          Bethena Roys. Sowles, MD (Referring MD) Medicines:             Propofol per Anesthesia Complications:         No immediate complications. Procedure:             Pre-Anesthesia Assessment:                        - Prior to the procedure, a History and Physical was                         performed, and patient medications and allergies were                         reviewed. The patient's tolerance of previous                         anesthesia was also reviewed. The risks and benefits                         of the procedure and the sedation options and risks                         were discussed with the patient. All questions were                         answered, and informed consent was obtained. Prior                         Anticoagulants: The patient has taken no previous                         anticoagulant or antiplatelet agents. ASA Grade                         Assessment: II - A patient with mild systemic disease.                         After reviewing the risks and benefits, the patient                         was deemed in satisfactory condition to undergo the                         procedure.  After obtaining informed consent, the colonoscope was                         passed under direct vision. Throughout the procedure,                         the patient's blood pressure, pulse, and oxygen                         saturations were monitored continuously. The                          Colonoscope was introduced through the anus and                         advanced to the the cecum, identified by appendiceal                         orifice and ileocecal valve. The colonoscopy was                         performed without difficulty. The patient tolerated                         the procedure well. The quality of the bowel                         preparation was excellent. Findings:      The perianal and digital rectal examinations were normal.      Two sessile polyps were found in the transverse colon. The polyps were 4       to 5 mm in size. These polyps were removed with a cold snare. Resection       and retrieval were complete.      Non-bleeding internal hemorrhoids were found during retroflexion. The       hemorrhoids were Grade I (internal hemorrhoids that do not prolapse).      Multiple small-mouthed diverticula were found in the sigmoid colon. Impression:            - Two 4 to 5 mm polyps in the transverse colon,                         removed with a cold snare. Resected and retrieved.                        - Non-bleeding internal hemorrhoids.                        - Diverticulosis in the sigmoid colon. Recommendation:        - Discharge patient to home.                        - Resume previous diet.                        - Continue present medications.                        - Await pathology results. Procedure Code(s):     --- Professional ---  45385, Colonoscopy, flexible; with removal of                         tumor(s), polyp(s), or other lesion(s) by snare                         technique Diagnosis Code(s):     --- Professional ---                        Z86.010, Personal history of colonic polyps                        K63.5, Polyp of colon CPT copyright 2019 American Medical Association. All rights reserved. The codes documented in this report are preliminary and upon coder review may  be revised to meet  current compliance requirements. Lucilla Lame MD, MD 01/17/2020 9:30:32 AM This report has been signed electronically. Number of Addenda: 0 Note Initiated On: 01/17/2020 9:07 AM Scope Withdrawal Time: 0 hours 6 minutes 5 seconds  Total Procedure Duration: 0 hours 9 minutes 53 seconds  Estimated Blood Loss:  Estimated blood loss: none.      [facility]

## 2020-01-18 LAB — HM COLONOSCOPY

## 2020-01-18 LAB — SURGICAL PATHOLOGY

## 2020-01-19 ENCOUNTER — Encounter: Payer: Self-pay | Admitting: Gastroenterology

## 2020-02-27 DIAGNOSIS — R413 Other amnesia: Secondary | ICD-10-CM | POA: Diagnosis not present

## 2020-03-09 ENCOUNTER — Other Ambulatory Visit: Payer: Self-pay | Admitting: Family Medicine

## 2020-03-09 DIAGNOSIS — R7989 Other specified abnormal findings of blood chemistry: Secondary | ICD-10-CM

## 2020-03-15 ENCOUNTER — Ambulatory Visit: Payer: Medicare Other

## 2020-03-23 ENCOUNTER — Ambulatory Visit (INDEPENDENT_AMBULATORY_CARE_PROVIDER_SITE_OTHER): Payer: Medicare Other | Admitting: Family Medicine

## 2020-03-23 ENCOUNTER — Other Ambulatory Visit: Payer: Self-pay | Admitting: Family Medicine

## 2020-03-23 ENCOUNTER — Encounter: Payer: Self-pay | Admitting: Family Medicine

## 2020-03-23 ENCOUNTER — Other Ambulatory Visit: Payer: Self-pay

## 2020-03-23 VITALS — BP 128/72 | HR 98 | Temp 98.1°F | Resp 16 | Ht 59.0 in | Wt 212.7 lb

## 2020-03-23 DIAGNOSIS — E785 Hyperlipidemia, unspecified: Secondary | ICD-10-CM | POA: Diagnosis not present

## 2020-03-23 DIAGNOSIS — R413 Other amnesia: Secondary | ICD-10-CM

## 2020-03-23 DIAGNOSIS — N182 Chronic kidney disease, stage 2 (mild): Secondary | ICD-10-CM

## 2020-03-23 DIAGNOSIS — Z1231 Encounter for screening mammogram for malignant neoplasm of breast: Secondary | ICD-10-CM

## 2020-03-23 DIAGNOSIS — I10 Essential (primary) hypertension: Secondary | ICD-10-CM

## 2020-03-23 DIAGNOSIS — E1122 Type 2 diabetes mellitus with diabetic chronic kidney disease: Secondary | ICD-10-CM

## 2020-03-23 DIAGNOSIS — F418 Other specified anxiety disorders: Secondary | ICD-10-CM

## 2020-03-23 DIAGNOSIS — E559 Vitamin D deficiency, unspecified: Secondary | ICD-10-CM

## 2020-03-23 LAB — POCT GLYCOSYLATED HEMOGLOBIN (HGB A1C): Hemoglobin A1C: 6.1 % — AB (ref 4.0–5.6)

## 2020-03-23 MED ORDER — CITALOPRAM HYDROBROMIDE 10 MG PO TABS: 10.0000 mg | ORAL_TABLET | Freq: Every day | ORAL | 0 refills | Status: DC

## 2020-03-23 MED ORDER — ATORVASTATIN CALCIUM 40 MG PO TABS: 40.0000 mg | ORAL_TABLET | Freq: Every day | ORAL | 1 refills | Status: DC

## 2020-03-23 MED ORDER — AMLODIPINE BESYLATE 10 MG PO TABS: 10.0000 mg | ORAL_TABLET | Freq: Every day | ORAL | 1 refills | Status: DC

## 2020-03-23 MED ORDER — METFORMIN HCL 500 MG PO TABS: 500.0000 mg | ORAL_TABLET | Freq: Every evening | ORAL | 1 refills | Status: DC

## 2020-03-23 MED ORDER — BLOOD GLUCOSE METER KIT: PACK | 0 refills | Status: DC

## 2020-03-23 NOTE — Progress Notes (Signed)
Name: Yvonne Curtis   MRN: [id number]    DOB: 05-Jul-1950   Date:03/23/2020       Progress Note  Subjective  Chief Complaint  Follow up   HPI   Memory loss:  Cognitive dysfunction started end of 2020, labs were done in our office and normal, she was referred to Dr. Melrose Nakayama because of progression of symptoms, failed MMS , she is now on Namenda and off Aricept because it caused nausea She is forgetful , husband is doing most of the cooking, also needs to  dispense her medications, and help her get dressed. She is not taking Citalopram or buspar, but seems to be depressed, no longer dancing, does not want to go for walks.   DMII with renal manifestation: she has been taking Metformin daily and denies side effects,including no diarrhea,, she denies hypoglycemic episode. HerhgbA1C was 5.7% l, today is 6.1 %  She denies polyphagia, polyuria or polydipsia.Last urine micro negative but she has a history of microalbuminuria that resolved withARB.Eye exam is up to date. She states food is causing a salty sensation in her mouth - they discussed it with Dr. Melrose Nakayama.   Hypercalcemia:denies taking otc supplementation, denies cramping or heartburn. No history of kidney stonesLast pth was normal   HTN: she takes medications daily, bp is at goal, no chest pain,  palpitation or dizineess.   Hyperlipidemia: Taking Lipitor and denies side effects. No myalgia.Sending refill to pharmacy   Obesity: Kennedy Bucker lost 13 lbs but gained it back since last visit . It was down to 209 lbs October 2020 n May it was  216 lbs it was down to 201 lbs, weight is up to 212.7 lbs. She has some taste disturbance, never had covid -19, needs to resume physical activity   OA knee: she states her knees no longer bothering her since she stopped working.She has not been dancing or walking as often.   Morbid obesity: BMI above 40, discussed importance of weight loss  Patient Active Problem List   Diagnosis Date Noted  .  History of colonic polyps   . Polyp of transverse colon   . Abnormal sense of taste 12/19/2019  . Loss of memory 12/19/2019  . Mood changes 12/19/2019  . Acanthosis nigricans 11/14/2014  . Benign hypertension 11/14/2014  . Dyslipidemia 11/14/2014  . Gastro-esophageal reflux disease without esophagitis 11/14/2014  . Extreme obesity 11/14/2014  . Primary osteoarthritis of both knees 11/14/2014  . Microalbuminuria 10/15/2012  . Controlled type 2 diabetes mellitus with microalbuminuria ([place]) 06/05/2009  . Body dermatophytosis 12/13/2008    Past Surgical History:  Procedure Laterality Date  . CHOLECYSTECTOMY    . COLONOSCOPY WITH PROPOFOL N/A 11/30/2014   Procedure: COLONOSCOPY WITH PROPOFOL;  Surgeon: Lucilla Lame, MD;  Location: [place];  Service: Endoscopy;  Laterality: N/A;  WITH BIOPSY-- TRANSVERSE COLON POLYP  X 2 DESCENING COLON POLYP  . COLONOSCOPY WITH PROPOFOL N/A 01/17/2020   Procedure: COLONOSCOPY WITH PROPOFOL;  Surgeon: Lucilla Lame, MD;  Location: [facility] ENDOSCOPY;  Service: Endoscopy;  Laterality: N/A;  . GALLBLADDER SURGERY    . SEPTOPLASTY    . tonsillectomy    . TONSILLECTOMY    . TUBAL LIGATION      Family History  Problem Relation Age of Onset  . Colon cancer Mother   . Lung cancer Father   . Hypertension Sister   . Hypertension Sister     Social History   Tobacco Use  . Smoking status: Never Smoker  . Smokeless  tobacco: Never Used  Substance Use Topics  . Alcohol use: No    Alcohol/week: 0.0 standard drinks     Current Outpatient Medications:  .  acetaminophen (TYLENOL) 500 MG tablet, Take by mouth., Disp: , Rfl:  .  amLODipine (NORVASC) 10 MG tablet, Take 1 tablet (10 mg total) by mouth daily., Disp: 90 tablet, Rfl: 1 .  aspirin EC 81 MG tablet, Take by mouth., Disp: , Rfl:  .  atorvastatin (LIPITOR) 40 MG tablet, Take 1 tablet (40 mg total) by mouth daily., Disp: 90 tablet, Rfl: 1 .  cetirizine (ZYRTEC) 10 MG tablet, Take 10 mg by  mouth daily., Disp: , Rfl:  .  Cholecalciferol (VITAMIN D3) 2000 UNITS capsule, VITAMIN D3, 2000UNIT (Oral Capsule)  2 po qday for 0 days  Quantity: 60.00;  Refills: 0   Ordered :15-May-2010  Steele Sizer MD;  Buddy Duty 05-October-2008 Active, Disp: , Rfl:  .  citalopram (CELEXA) 10 MG tablet, Take 1 tablet (10 mg total) by mouth daily., Disp: 90 tablet, Rfl: 0 .  DUREZOL 0.05 % EMUL, Apply 1 drop to eye 4 (four) times daily., Disp: , Rfl:  .  memantine (NAMENDA) 10 MG tablet, Take 10 mg by mouth 2 (two) times daily., Disp: , Rfl:  .  metFORMIN (GLUCOPHAGE) 500 MG tablet, Take 1 tablet (500 mg total) by mouth every evening., Disp: 90 tablet, Rfl: 1 .  Multiple Vitamin (MULTI-VITAMIN) tablet, Take 1 tablet by mouth daily., Disp: , Rfl:  .  Specialty Vitamins Products (ONE-A-DAY MEMORY PO), Take 1 tablet by mouth 2 (two) times daily., Disp: , Rfl:  .  telmisartan-hydrochlorothiazide (MICARDIS HCT) 80-25 MG tablet, Take 1 tablet by mouth daily., Disp: 90 tablet, Rfl: 1 .  blood glucose meter kit and supplies, Dispense based on patient and insurance preference. Use once daily as directed. (FOR ICD-10 E10.9, E11.9). STrips 100 Lancets 100, Disp: 1 each, Rfl: 0 .  fluticasone (FLONASE) 50 MCG/ACT nasal spray, Place 2 sprays into both nostrils daily. (Patient not taking: Reported on 03/23/2020), Disp: 48 g, Rfl: 1 .  loratadine (CLARITIN) 10 MG tablet, Take 1 tablet (10 mg total) by mouth daily. (Patient not taking: Reported on 03/23/2020), Disp: 30 tablet, Rfl: 0  Allergies  Allergen Reactions  . Oxycodone Other (See Comments)  . Oxycodone Hcl     insomnia, agitation    I personally reviewed active problem list, medication list, allergies, family history, social history, health maintenance with the patient/caregiver today.   ROS  Constitutional: Negative for fever, positive for  weight change.  Respiratory: Negative for cough and shortness of breath.   Cardiovascular: Negative for chest pain or  palpitations.  Gastrointestinal: Negative for abdominal pain, no bowel changes.  Musculoskeletal: Negative for gait problem or joint swelling.  Skin: Negative for rash.  Neurological: Negative for dizziness or headache.  No other specific complaints in a complete review of systems (except as listed in HPI above).  Objective  Vitals:   03/23/20 1058  BP: 128/72  Pulse: 98  Resp: 16  Temp: 98.1 F (36.7 C)  TempSrc: Oral  SpO2: 99%  Weight: 212 lb 11.2 oz (96.5 kg)  Height: $Remove'4\' 11"'MgyTwAa$  (1.499 m)    Body mass index is 42.96 kg/m.  Physical Exam  Constitutional: Patient appears well-developed and well-nourished. Obese No distress.  HEENT: head atraumatic, normocephalic, pupils equal and reactive to light,  neck supple Cardiovascular: Normal rate, regular rhythm and normal heart sounds.  No murmur heard. No BLE edema. Pulmonary/Chest: Effort  normal and breath sounds normal. No respiratory distress. Abdominal: Soft.  There is no tenderness. Psychiatric: Patient has a normal mood and affect. behavior is normal. Judgment and thought content normal.  Recent Results (from the past 2160 hour(s))  SARS CORONAVIRUS 2 (TAT 6-24 HRS) Nasopharyngeal Nasopharyngeal Swab     Status: None   Collection Time: 01/13/20 10:50 AM   Specimen: Nasopharyngeal Swab  Result Value Ref Range   SARS Coronavirus 2 NEGATIVE NEGATIVE    Comment: (NOTE) SARS-CoV-2 target nucleic acids are NOT DETECTED.  The SARS-CoV-2 RNA is generally detectable in upper and lower respiratory specimens during the acute phase of infection. Negative results do not preclude SARS-CoV-2 infection, do not rule out co-infections with other pathogens, and should not be used as the sole basis for treatment or other patient management decisions. Negative results must be combined with clinical observations, patient history, and epidemiological information. The expected result is Negative.  Fact Sheet for  Patients: SugarRoll.be  Fact Sheet for Healthcare Providers: https://www.woods-mathews.com/  This test is not yet approved or cleared by the [place] FDA and  has been authorized for detection and/or diagnosis of SARS-CoV-2 by FDA under an Emergency Use Authorization (EUA). This EUA will remain  in effect (meaning this test can be used) for the duration of the COVID-19 declaration under Se ction 564(b)(1) of the Act, 21 U.S.C. section 360bbb-3(b)(1), unless the authorization is terminated or revoked sooner.  Performed at [facility], [address] [place], [place] [address]   HM COLONOSCOPY     Status: None   Collection Time: 01/17/20 12:00 AM  Result Value Ref Range   HM Colonoscopy See Report (in chart) See Report (in chart), Patient Reported  Glucose, capillary     Status: Abnormal   Collection Time: 01/17/20  8:41 AM  Result Value Ref Range   Glucose-Capillary 116 (H) 70 - 99 mg/dL    Comment: Glucose reference range applies only to samples taken after fasting for at least 8 hours.  Surgical pathology     Status: None   Collection Time: 01/17/20  9:21 AM  Result Value Ref Range   SURGICAL PATHOLOGY      SURGICAL PATHOLOGY CASE: [id number] PATIENT: [place] [facility] Report     Specimen Submitted: A. Colon polyp x2, transverse; cold snare  Clinical History: Personal history of colon polyps.  Colon polyps, diverticulosis, hemorrhoids.      DIAGNOSIS: A. COLON POLYP X2, TRANSVERSE; COLD SNARE: - TUBULAR ADENOMA (MULTIPLE FRAGMENTS). - NEGATIVE FOR HIGH-GRADE DYSPLASIA AND MALIGNANCY.  GROSS DESCRIPTION: A. Labeled: Cold snare polyp x2 transverse colon Received: Formalin Tissue fragment(s): Multiple Size: Aggregate, 1.2 x 0.4 x 0.2 cm Description: Tan soft tissue fragments Entirely submitted in 1 cassette.    Final Diagnosis performed by Quay Burow, MD.   Electronically  signed 01/18/2020 9:08:51AM The electronic signature indicates that the named Attending Pathologist has evaluated the specimen Technical component performed at [facility], [address] [place], [place] [address] Lab: [phone] Dir: Rush Farmer, MD, MMM  Professi onal component performed at [facility], [facility], [place], [place], [place] [address] Lab: [phone] Dir: Dellia Nims. Rubinas, MD   HM COLONOSCOPY     Status: None   Collection Time: 01/18/20 12:00 AM  Result Value Ref Range   HM Colonoscopy See Report (in chart) See Report (in chart), Patient Reported    Diabetic Foot Exam: Diabetic Foot Exam - Simple   Simple Foot Form Visual Inspection No deformities,  no ulcerations, no other skin breakdown bilaterally: Yes Sensation Testing Intact to touch and monofilament testing bilaterally: Yes Pulse Check Posterior Tibialis and Dorsalis pulse intact bilaterally: Yes Comments      PHQ2/9: Depression screen [facility] 2/9 03/23/2020 12/22/2019 11/24/2019 10/20/2019 09/13/2019  Decreased Interest 2 0 0 1 0  Down, Depressed, Hopeless 0 0 0 0 0  PHQ - 2 Score 2 0 0 1 0  Altered sleeping 0 0 0 0 0  Tired, decreased energy 1 2 0 0 0  Change in appetite 0 2 0 0 0  Feeling bad or failure about yourself  0 0 0 0 0  Trouble concentrating 3 0 1 1 0  Moving slowly or fidgety/restless 0 0 0 0 0  Suicidal thoughts 0 0 0 0 0  PHQ-9 Score $RemoveBef'6 4 1 2 'dWDvwlMWIf$ 0  Difficult doing work/chores Very difficult - Not difficult at all Not difficult at all Not difficult at all  Some recent data might be hidden    phq 9 is positive  Fall Risk: Fall Risk  03/23/2020 11/24/2019 10/20/2019 08/09/2019 03/15/2019  Falls in the past year? 0 0 0 0 0  Number falls in past yr: 0 0 0 0 0  Injury with Fall? 0 0 0 0 0  Follow up - - Falls evaluation completed - Falls prevention discussed     Functional Status Survey: Is the patient deaf or have difficulty hearing?: No Does the patient have  difficulty seeing, even when wearing glasses/contacts?: No Does the patient have difficulty concentrating, remembering, or making decisions?: Yes Does the patient have difficulty walking or climbing stairs?: No Does the patient have difficulty dressing or bathing?: No Does the patient have difficulty doing errands alone such as visiting a doctor's office or shopping?: No   Assessment & Plan  1. Controlled type 2 diabetes mellitus with stage 2 chronic kidney disease, without long-term current use of insulin (HCC)  - POCT HgB A1C - HM Diabetes Foot Exam - metFORMIN (GLUCOPHAGE) 500 MG tablet; Take 1 tablet (500 mg total) by mouth every evening.  Dispense: 90 tablet; Refill: 1 - blood glucose meter kit and supplies; Dispense based on patient and insurance preference. Use once daily as directed. (FOR ICD-10 E10.9, E11.9). STrips 100 Lancets 100  Dispense: 1 each; Refill: 0  2. Breast cancer screening by mammogram  - MM Digital Screening; Future  3. Benign hypertension  - amLODipine (NORVASC) 10 MG tablet; Take 1 tablet (10 mg total) by mouth daily.  Dispense: 90 tablet; Refill: 1  4. Dyslipidemia  - atorvastatin (LIPITOR) 40 MG tablet; Take 1 tablet (40 mg total) by mouth daily.  Dispense: 90 tablet; Refill: 1  5. Morbid obesity ([place])  Discussed with the patient the risk posed by an increased BMI. Discussed importance of portion control, calorie counting and at least 150 minutes of physical activity weekly. Avoid sweet beverages and drink more water. Eat at least 6 servings of fruit and vegetables daily   6. Vitamin D deficiency   7. Memory loss   8. Depression with anxiety  - citalopram (CELEXA) 10 MG tablet; Take 1 tablet (10 mg total) by mouth daily.  Dispense: 90 tablet; Refill: 0   Patient fell in the exam room. She was sitting at the exam table and was leaning forward to put her shoes on, I suggested her to seat at the chair instead before putting her shoes on, she  stood up with her right shoe half on and socks on  left foot , she slipped and I used my arm to break her fall. It was in slow motion, she fell towards the left side and slid on the exam table step with left outer hip. She was able to get up on her own, she denied any pain or discomfort, normal exam after the fall.

## 2020-04-17 ENCOUNTER — Encounter: Payer: Self-pay | Admitting: Family Medicine

## 2020-04-17 ENCOUNTER — Other Ambulatory Visit: Payer: Self-pay

## 2020-04-17 ENCOUNTER — Ambulatory Visit (INDEPENDENT_AMBULATORY_CARE_PROVIDER_SITE_OTHER): Payer: Medicare Other | Admitting: Family Medicine

## 2020-04-17 ENCOUNTER — Ambulatory Visit (INDEPENDENT_AMBULATORY_CARE_PROVIDER_SITE_OTHER): Payer: Medicare Other

## 2020-04-17 VITALS — BP 122/70 | HR 86 | Temp 97.7°F | Resp 16 | Ht 59.0 in | Wt 204.0 lb

## 2020-04-17 VITALS — BP 102/56 | HR 86 | Temp 97.7°F | Resp 15 | Ht 59.0 in | Wt 204.0 lb

## 2020-04-17 DIAGNOSIS — R634 Abnormal weight loss: Secondary | ICD-10-CM

## 2020-04-17 DIAGNOSIS — Z0000 Encounter for general adult medical examination without abnormal findings: Secondary | ICD-10-CM

## 2020-04-17 DIAGNOSIS — R6889 Other general symptoms and signs: Secondary | ICD-10-CM

## 2020-04-17 DIAGNOSIS — R432 Parageusia: Secondary | ICD-10-CM | POA: Diagnosis not present

## 2020-04-17 NOTE — Patient Instructions (Signed)
Yvonne Curtis , Thank you for taking time to come for your Medicare Wellness Visit. I appreciate your ongoing commitment to your health goals. Please review the following plan we discussed and let me know if I can assist you in the future.   Screening recommendations/referrals: Colonoscopy: done 01/18/20. Repeat in 2026 Mammogram: done 12/23/17. Scheduled for 05/17/20 Bone Density: done 06/01/17 Recommended yearly ophthalmology/optometry visit for glaucoma screening and checkup Recommended yearly dental visit for hygiene and checkup  Vaccinations: Influenza vaccine: done 01/18/20 Pneumococcal vaccine: done 02/20/20 Tdap vaccine: done 06/26/10 Shingles vaccine: will contact [name] for vaccine information    Covid-19:done 06/17/19 & 07/18/19; please bring your vaccine record with you to your next appt for your booster information   Conditions/risks identified: Recommend drinking 6-8 glasses of water per day   Next appointment: Follow up in one year for your annual wellness visit    Preventive Care 65 Years and Older, Female Preventive care refers to lifestyle choices and visits with your health care provider that can promote health and wellness. What does preventive care include?  A yearly physical exam. This is also called an annual well check.  Dental exams once or twice a year.  Routine eye exams. Ask your health care provider how often you should have your eyes checked.  Personal lifestyle choices, including:  Daily care of your teeth and gums.  Regular physical activity.  Eating a healthy diet.  Avoiding tobacco and drug use.  Limiting alcohol use.  Practicing safe sex.  Taking low-dose aspirin every day.  Taking vitamin and mineral supplements as recommended by your health care provider. What happens during an annual well check? The services and screenings done by your health care provider during your annual well check will depend on your age, overall health,  lifestyle risk factors, and family history of disease. Counseling  Your health care provider may ask you questions about your:  Alcohol use.  Tobacco use.  Drug use.  Emotional well-being.  Home and relationship well-being.  Sexual activity.  Eating habits.  History of falls.  Memory and ability to understand (cognition).  Work and work Statistician.  Reproductive health. Screening  You may have the following tests or measurements:  Height, weight, and BMI.  Blood pressure.  Lipid and cholesterol levels. These may be checked every 5 years, or more frequently if you are over 51 years old.  Skin check.  Lung cancer screening. You may have this screening every year starting at age 71 if you have a 30-pack-year history of smoking and currently smoke or have quit within the past 15 years.  Fecal occult blood test (FOBT) of the stool. You may have this test every year starting at age 14.  Flexible sigmoidoscopy or colonoscopy. You may have a sigmoidoscopy every 5 years or a colonoscopy every 10 years starting at age 29.  Hepatitis C blood test.  Hepatitis B blood test.  Sexually transmitted disease (STD) testing.  Diabetes screening. This is done by checking your blood sugar (glucose) after you have not eaten for a while (fasting). You may have this done every 1-3 years.  Bone density scan. This is done to screen for osteoporosis. You may have this done starting at age 69.  Mammogram. This may be done every 1-2 years. Talk to your health care provider about how often you should have regular mammograms. Talk with your health care provider about your test results, treatment options, and if necessary, the need for more tests. Vaccines  Your health care provider may recommend certain vaccines, such as:  Influenza vaccine. This is recommended every year.  Tetanus, diphtheria, and acellular pertussis (Tdap, Td) vaccine. You may need a Td booster every 10 years.  Zoster  vaccine. You may need this after age 66.  Pneumococcal 13-valent conjugate (PCV13) vaccine. One dose is recommended after age 21.  Pneumococcal polysaccharide (PPSV23) vaccine. One dose is recommended after age 70. Talk to your health care provider about which screenings and vaccines you need and how often you need them. This information is not intended to replace advice given to you by your health care provider. Make sure you discuss any questions you have with your health care provider. Document Released: 05/18/2015 Document Revised: 01/09/2016 Document Reviewed: 02/20/2015 Elsevier Interactive Patient Education  2017 [employer] Prevention in the Home Falls can cause injuries. They can happen to people of all ages. There are many things you can do to make your home safe and to help prevent falls. What can I do on the outside of my home?  Regularly fix the edges of walkways and driveways and fix any cracks.  Remove anything that might make you trip as you walk through a door, such as a raised step or threshold.  Trim any bushes or trees on the path to your home.  Use bright outdoor lighting.  Clear any walking paths of anything that might make someone trip, such as rocks or tools.  Regularly check to see if handrails are loose or broken. Make sure that both sides of any steps have handrails.  Any raised decks and porches should have guardrails on the edges.  Have any leaves, snow, or ice cleared regularly.  Use sand or salt on walking paths during winter.  Clean up any spills in your garage right away. This includes oil or grease spills. What can I do in the bathroom?  Use night lights.  Install grab bars by the toilet and in the tub and shower. Do not use towel bars as grab bars.  Use non-skid mats or decals in the tub or shower.  If you need to sit down in the shower, use a plastic, non-slip stool.  Keep the floor dry. Clean up any water that spills on the  floor as soon as it happens.  Remove soap buildup in the tub or shower regularly.  Attach bath mats securely with double-sided non-slip rug tape.  Do not have throw rugs and other things on the floor that can make you trip. What can I do in the bedroom?  Use night lights.  Make sure that you have a light by your bed that is easy to reach.  Do not use any sheets or blankets that are too big for your bed. They should not hang down onto the floor.  Have a firm chair that has side arms. You can use this for support while you get dressed.  Do not have throw rugs and other things on the floor that can make you trip. What can I do in the kitchen?  Clean up any spills right away.  Avoid walking on wet floors.  Keep items that you use a lot in easy-to-reach places.  If you need to reach something above you, use a strong step stool that has a grab bar.  Keep electrical cords out of the way.  Do not use floor polish or wax that makes floors slippery. If you must use wax, use non-skid floor wax.  Do  not have throw rugs and other things on the floor that can make you trip. What can I do with my stairs?  Do not leave any items on the stairs.  Make sure that there are handrails on both sides of the stairs and use them. Fix handrails that are broken or loose. Make sure that handrails are as long as the stairways.  Check any carpeting to make sure that it is firmly attached to the stairs. Fix any carpet that is loose or worn.  Avoid having throw rugs at the top or bottom of the stairs. If you do have throw rugs, attach them to the floor with carpet tape.  Make sure that you have a light switch at the top of the stairs and the bottom of the stairs. If you do not have them, ask someone to add them for you. What else can I do to help prevent falls?  Wear shoes that:  Do not have high heels.  Have rubber bottoms.  Are comfortable and fit you well.  Are closed at the toe. Do not wear  sandals.  If you use a stepladder:  Make sure that it is fully opened. Do not climb a closed stepladder.  Make sure that both sides of the stepladder are locked into place.  Ask someone to hold it for you, if possible.  Clearly mark and make sure that you can see:  Any grab bars or handrails.  First and last steps.  Where the edge of each step is.  Use tools that help you move around (mobility aids) if they are needed. These include:  Canes.  Walkers.  Scooters.  Crutches.  Turn on the lights when you go into a dark area. Replace any light bulbs as soon as they burn out.  Set up your furniture so you have a clear path. Avoid moving your furniture around.  If any of your floors are uneven, fix them.  If there are any pets around you, be aware of where they are.  Review your medicines with your doctor. Some medicines can make you feel dizzy. This can increase your chance of falling. Ask your doctor what other things that you can do to help prevent falls. This information is not intended to replace advice given to you by your health care provider. Make sure you discuss any questions you have with your health care provider. Document Released: 02/15/2009 Document Revised: 09/27/2015 Document Reviewed: 05/26/2014 Elsevier Interactive Patient Education  2017 [employer].

## 2020-04-17 NOTE — Progress Notes (Signed)
Subjective:   Yvonne Curtis is a 69 y.o. female who presents for Medicare Annual (Subsequent) preventive examination.  Review of Systems     Cardiac Risk Factors include: advanced age (>18men, >49 women);diabetes mellitus;dyslipidemia;hypertension;sedentary lifestyle;obesity (BMI >30kg/m2)     Objective:    Today's Vitals   04/17/20 0820 04/17/20 0823  BP: (!) 102/56   Pulse: 86   Resp: 15   Temp: 97.7 F (36.5 C)   TempSrc: Oral   SpO2: 98%   Weight: 204 lb (92.5 kg)   Height: $Remove'4\' 11"'bUTDLkh$  (1.499 m)   PainSc:  0-No pain   Body mass index is 41.2 kg/m.  Advanced Directives 04/17/2020 01/17/2020 03/15/2019 03/09/2018 03/04/2017 03/04/2017 12/04/2016  Does Patient Have a Medical Advance Directive? Yes Yes Yes Yes Yes Yes Yes  Type of Paramedic of [place];Living will - [place];Living will [place];Living will Healthcare Power of Attorney Living will Living will;Healthcare Power of Attorney  Does patient want to make changes to medical advance directive? - - - No - Patient declined Yes (Inpatient - patient defers changing a medical advance directive at this time) Yes (Inpatient - patient defers changing a medical advance directive at this time) -  Copy of [place] in Chart? Yes - validated most recent copy scanned in chart (See row information) - Yes - validated most recent copy scanned in chart (See row information) Yes Yes - Yes  Would patient like information on creating a medical advance directive? - - - - - - -    Current Medications (verified) Outpatient Encounter Medications as of 04/17/2020  Medication Sig  . acetaminophen (TYLENOL) 500 MG tablet Take by mouth.  Marland Kitchen amLODipine (NORVASC) 10 MG tablet Take 1 tablet (10 mg total) by mouth daily.  Marland Kitchen aspirin EC 81 MG tablet Take by mouth.  Marland Kitchen atorvastatin (LIPITOR) 40 MG tablet Take 1 tablet (40 mg total) by mouth daily.  . blood glucose meter  kit and supplies Dispense based on patient and insurance preference. Use once daily as directed. (FOR ICD-10 E10.9, E11.9). STrips 100 Lancets 100  . Cholecalciferol (VITAMIN D3) 2000 UNITS capsule VITAMIN D3, 2000UNIT (Oral Capsule)  2 po qday for 0 days  Quantity: 60.00;  Refills: 0   Ordered :15-May-2010  Steele Sizer MD;  Buddy Duty 05-October-2008 Active  . citalopram (CELEXA) 10 MG tablet Take 1 tablet (10 mg total) by mouth daily.  . Lancets (ONETOUCH DELICA PLUS ZYSAYT01S) MISC Use as directed to check blood sugar  . memantine (NAMENDA) 10 MG tablet Take 10 mg by mouth 2 (two) times daily.  . metFORMIN (GLUCOPHAGE) 500 MG tablet Take 1 tablet (500 mg total) by mouth every evening.  . Multiple Vitamin (MULTI-VITAMIN) tablet Take 1 tablet by mouth daily.  Glory Rosebush ULTRA test strip 1 each daily. Use as directed to check blood sugar  . Specialty Vitamins Products (ONE-A-DAY MEMORY PO) Take 1 tablet by mouth 2 (two) times daily.  Marland Kitchen telmisartan-hydrochlorothiazide (MICARDIS HCT) 80-25 MG tablet Take 1 tablet by mouth daily.  . cetirizine (ZYRTEC) 10 MG tablet Take 10 mg by mouth daily. (Patient not taking: No sig reported)  . fluticasone (FLONASE) 50 MCG/ACT nasal spray Place 2 sprays into both nostrils daily. (Patient not taking: No sig reported)  . loratadine (CLARITIN) 10 MG tablet Take 1 tablet (10 mg total) by mouth daily. (Patient not taking: No sig reported)  . [DISCONTINUED] DUREZOL 0.05 % EMUL Apply 1 drop to  eye 4 (four) times daily.   No facility-administered encounter medications on file as of 04/17/2020.    Allergies (verified) Oxycodone and Oxycodone hcl   History: Past Medical History:  Diagnosis Date  . Arthritis    knees  . Cataract 11/25/2017   Nuclear - OU  . Diabetes mellitus without complication ([place])   . Hyperlipidemia   . Hyperopia, bilateral 11/25/2017   [place]. [place]  . Hypertension   . Obesity   . Patient is Jehovah's Witness    declines blood  products  . Wears dentures    full upper   Past Surgical History:  Procedure Laterality Date  . CHOLECYSTECTOMY    . COLONOSCOPY WITH PROPOFOL N/A 11/30/2014   Procedure: COLONOSCOPY WITH PROPOFOL;  Surgeon: Lucilla Lame, MD;  Location: [facility];  Service: Endoscopy;  Laterality: N/A;  WITH BIOPSY-- TRANSVERSE COLON POLYP  X 2 DESCENING COLON POLYP  . COLONOSCOPY WITH PROPOFOL N/A 01/17/2020   Procedure: COLONOSCOPY WITH PROPOFOL;  Surgeon: Lucilla Lame, MD;  [facility]COPY;  Service: Endoscopy;  Laterality: N/A;  . GALLBLADDER SURGERY    . SEPTOPLASTY    . tonsillectomy    . TONSILLECTOMY    . TUBAL LIGATION     Family History  Problem Relation Age of Onset  . Colon cancer Mother   . Lung cancer Father   . Hypertension Sister   . Hypertension Sister    Social History   Socioeconomic History  . Marital status: Married    Spouse name: [name]   . Number of children: 2  . Years of education: Not on file  . Highest education level: 12th grade  Occupational History  . Occupation: retired  Tobacco Use  . Smoking status: Never Smoker  . Smokeless tobacco: Never Used  Vaping Use  . Vaping Use: Never used  Substance and Sexual Activity  . Alcohol use: No    Alcohol/week: 0.0 standard drinks  . Drug use: No  . Sexual activity: Yes    Partners: Male  Other Topics Concern  . 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    She dances for physical activity   She has two grandchildren, one is in college   Social Determinants of Health   Financial Resource Strain: Low Risk   . Difficulty of Paying Living Expenses: Not hard at all  Food Insecurity: No Food Insecurity  . Worried About Charity fundraiser in the Last Year: Never true  . Ran Out of Food in the Last Year: Never true  Transportation Needs: No Transportation Needs  . Lack of Transportation (Medical): No  . Lack  of Transportation (Non-Medical): No  Physical Activity: Inactive  . Days of Exercise per Week: 0 days  . Minutes of Exercise per Session: 0 min  Stress: No Stress Concern Present  . Feeling of Stress : Not at all  Social Connections: Socially Integrated  . Frequency of Communication with Friends and Family: More than three times a week  . Frequency of Social Gatherings with Friends and Family: More than three times a week  . Attends Religious Services: More than 4 times per year  . Active Member of Clubs or Organizations: Yes  . Attends Archivist Meetings: More than 4 times per year  . Marital Status: Married    Tobacco Counseling Counseling given: Not Answered   Clinical Intake:  Pre-visit preparation completed: Yes  Pain : No/denies pain Pain Score: 0-No pain     BMI - recorded: 41.2 Nutritional Status: BMI > 30  Obese Nutritional Risks: None Diabetes: Yes CBG done?: No Did pt. bring in CBG monitor from home?: No  How often do you need to have someone help you when you read instructions, pamphlets, or other written materials from your doctor or pharmacy?: 1 - Never  Nutrition Risk Assessment:  Has the patient had any N/V/D within the last 2 months?  No  Does the patient have any non-healing wounds?  No  Has the patient had any unintentional weight loss or weight gain?  Yes   Diabetes:  Is the patient diabetic?  Yes  If diabetic, was a CBG obtained today?  No  Did the patient bring in their glucometer from home?  No  How often do you monitor your CBG's? As needed.   Financial Strains and Diabetes Management:  Are you having any financial strains with the device, your supplies or your medication? No .  Does the patient want to be seen by Chronic Care Management for management of their diabetes?  No  Would the patient like to be referred to a Nutritionist or for Diabetic Management?  No   Diabetic Exams:  Diabetic Eye Exam: Completed 06/30/19  negative retinopathy Dr. Matilde Sprang. .  Diabetic Foot Exam: Completed 03/23/20.   Interpreter Needed?: No  Information entered by :: Clemetine Marker LPN   Activities of Daily Living In your present state of health, do you have any difficulty performing the following activities: 04/17/2020 03/23/2020  Hearing? N N  Comment declines hearing aids -  Vision? N N  Difficulty concentrating or making decisions? Tempie Donning  Walking or climbing stairs? N N  Dressing or bathing? N N  Doing errands, shopping? N N  Preparing Food and eating ? N -  Using the Toilet? N -  In the past six months, have you accidently leaked urine? N -  Do you have problems with loss of bowel control? N -  Managing your Medications? Y -  Managing your Finances? Y -  Housekeeping or managing your Housekeeping? Y -  Some recent data might be hidden    Patient Care Team: Steele Sizer, MD as PCP - General (Family Medicine) Mardene Celeste., MD as Consulting Physician (Dentistry) Jannifer Franklin, NP as Nurse Practitioner (Neurology) Eulogio Bear, MD as Consulting Physician (Ophthalmology)  Indicate any recent Medical Services you may have received from other than [facility] providers in the past year (date may be approximate).     Assessment:   This is a routine wellness examination for [place].  Hearing/Vision screen  Hearing Screening   '125Hz'$  $Remo'250Hz'uheqe$'500Hz'$'1000Hz'$'2000Hz'$'3000Hz'$'4000Hz'$'6000Hz'$'8000Hz'$   Right ear:           Left ear:           Comments: Pt denies hearing difficulty  Vision Screening Comments: [facility] screen[facility] Dr. Edison Pace  Dietary issues and exercise activities discussed: Current Exercise Habits: The patient does not participate in regular exercise at present, Exercise limited by: None identified  Goals    . DIET - INCREASE WATER INTAKE     Recommend drinking 6-8 glasses of water per day       Depression Screen PHQ 2/9 Scores 04/17/2020 03/23/2020 12/22/2019  11/24/2019 10/20/2019 09/13/2019 08/09/2019  PHQ - 2 Score 3 2 0 0 1 0 0  PHQ- 9 Score $Remov'8 6 4 1 2 'LwzqIS$ 0 0    Fall Risk Fall Risk  04/17/2020 03/23/2020 11/24/2019 10/20/2019 08/09/2019  Falls in the past year? 1 0 0 0 0  Number falls in past yr: 1 0 0 0 0  Injury with Fall? 0 0 0 0 0  Risk for fall due to : History of fall(s) - - - -  Follow up Falls prevention discussed - - Falls evaluation completed -    FALL RISK PREVENTION PERTAINING TO THE HOME:  Any stairs in or around the home? Yes  If so, are there any without handrails? No  Home free of loose throw rugs in walkways, pet beds, electrical cords, etc? Yes  Adequate lighting in your home to reduce risk of falls? Yes   ASSISTIVE DEVICES UTILIZED TO PREVENT FALLS:  Life alert? No  Use of a cane, walker or w/c? No  Grab bars in the bathroom? No  Shower chair or bench in shower? Yes  Elevated toilet seat or a handicapped toilet? No   TIMED UP AND GO:  Was the test performed? Yes .  Length of time to ambulate 10 feet: 6 sec.   Gait steady and fast without use of assistive device  Cognitive Function: Cognitive status assessed by direct observation. Patient has current diagnosis of cognitive impairment. Patient is followed by neurology for ongoing assessment.    MMSE - Mini Mental State Exam 08/09/2019  Orientation to time 3  Orientation to Place 5  Registration 3  Attention/ Calculation 0  Recall 3  Language- name 2 objects 2  Language- repeat 1  Language- follow 3 step command 3  Language- read & follow direction 1  Write a sentence 1  Copy design 0  Total score 22     6CIT Screen 03/15/2019 03/09/2018  What Year? 0 points 0 points  What month? 3 points 0 points  What time? 0 points 0 points  Count back from 20 0 points 0 points  Months in reverse 4 points 0 points  Repeat phrase 10 points 0 points  Total Score 17 0    Immunizations Immunization History  Administered Date(s) Administered  . Influenza Nasal 02/12/2018   . Influenza Split 02/23/2008  . Influenza,inj,Quad PF,6+ Mos 02/09/2019  . Influenza-Unspecified 01/25/2014, 01/23/2015, 01/30/2016, 02/06/2017, 01/28/2018, 01/18/2020  . Moderna Sars-Covid-2 Vaccination 06/17/2019, 07/18/2019  . Pneumococcal Conjugate-13 01/17/2015  . Pneumococcal Polysaccharide-23 06/26/2010, 02/20/2016  . Tdap 06/26/2010  . Zoster 03/06/2011    TDAP status: Up to date  Flu Vaccine status: Up to date  Pneumococcal vaccine status: Up to date  Covid-19 vaccine status: Completed vaccines  Qualifies for Shingles Vaccine? Yes   Zostavax completed Yes   Shingrix Completed?: Yes; will contact [address] for vaccine information  Screening Tests Health Maintenance  Topic Date Due  . MAMMOGRAM  12/24/2019  . COVID-19 Vaccine (3 - Booster for Moderna series) 01/18/2020  . TETANUS/TDAP  06/26/2020  . OPHTHALMOLOGY EXAM  06/29/2020  . HEMOGLOBIN A1C  09/20/2020  . FOOT EXAM  03/23/2021  . COLONOSCOPY  01/17/2025  . INFLUENZA VACCINE  Completed  . DEXA SCAN  Completed  . Hepatitis C Screening  Completed  . PNA vac Low Risk Adult  Completed    Health Maintenance  Health Maintenance Due  Topic Date Due  . MAMMOGRAM  12/24/2019  . COVID-19 Vaccine (3 - Booster for Moderna series) 01/18/2020    Colorectal cancer screening: Type of screening: Colonoscopy. Completed 01/18/20. Repeat  every 5 years  Mammogram status: Completed 12/23/17. Repeat every year  Bone Density status: Completed 06/01/17. Results reflect: Bone density results: NORMAL. Repeat every 3-5 years.  Lung Cancer Screening: (Low Dose CT Chest recommended if Age 40-80 years, 30 pack-year currently smoking OR have quit w/in 15years.) does not qualify.   Additional Screening:  Hepatitis C Screening: does qualify; Completed 01/30/12  Vision Screening: Recommended annual ophthalmology exams for early detection of glaucoma and other disorders of the eye. Is the patient up to date with their  annual eye exam?  Yes  Who is the provider or what is the name of the office in which the patient attends annual eye exams? Dr[facility]  Dental Screening: Recommended annual dental exams for proper oral hygiene  Community Resource Referral / Chronic Care Management: CRR required this visit?  No   CCM required this visit?  No      Plan:     I have personally reviewed and noted the following in the patient's chart:   . Medical and social history . Use of alcohol, tobacco or illicit drugs  . Current medications and supplements . Functional ability and status . Nutritional status . Physical activity . Advanced directives . List of other physicians . Hospitalizations, surgeries, and ER visits in previous 12 months . Vitals . Screenings to include cognitive, depression, and falls . Referrals and appointments  In addition, I have reviewed and discussed with patient certain preventive protocols, quality metrics, and best practice recommendations. A written personalized care plan for preventive services as well as general preventive health recommendations were provided to patient.     Clemetine Marker, LPN   [id number]   Nurse Notes: pt c/o salty taste in her mouth for the past week, decreased appetite, 9# weight loss in the last month, stays cold constantly and fell last night but no injury. Pt worked in to see Dr. Ancil Boozer today due to concerns for weight loss and hypotension

## 2020-04-17 NOTE — Progress Notes (Signed)
Name: Yvonne Curtis   MRN: [id number]    DOB: 07-Dec-1950   Date:04/17/2020       Progress Note  Subjective  Chief Complaint  Chief Complaint  Patient presents with   Consult    HPI  Taster perversion weight loss: husband and patient states she continues to think that everything is very salty, even a pancake tasted salted to her, she has been eating smaller portion because of it. She has cognitive impairment . She is seeing Dr. Melrose Nakayama also seen by ENT - Dr Tami Ribas and was advised to use a mouthwash , she just started a few days ago, but she states it does not seem to work. Symptoms waxes and wanes. She has lost 8 lbs in the past month, however her weight seems to go up and down and based on weight from last year, only down 5 lbs. Try to eat smaller portions, change the foot if does not taste good   Low BP: when she first arrived, but back to normal, explained importance of staying hydrated. We will continue current medications  Cold intolerance: multiple TSH levels, we will not recheck it today, no anemia.   Patient Active Problem List   Diagnosis Date Noted   History of colonic polyps    Polyp of transverse colon    Abnormal sense of taste 12/19/2019   Loss of memory 12/19/2019   Mood changes 12/19/2019   Acanthosis nigricans 11/14/2014   Benign hypertension 11/14/2014   Dyslipidemia 11/14/2014   Gastro-esophageal reflux disease without esophagitis 11/14/2014   Extreme obesity 11/14/2014   Primary osteoarthritis of both knees 11/14/2014   Microalbuminuria 10/15/2012   Controlled type 2 diabetes mellitus with microalbuminuria ([place]) 06/05/2009   Body dermatophytosis 12/13/2008    Social History   Tobacco Use   Smoking status: Never Smoker   Smokeless tobacco: Never Used  Substance Use Topics   Alcohol use: No    Alcohol/week: 0.0 standard drinks     Current Outpatient Medications:    acetaminophen (TYLENOL) 500 MG tablet, Take by mouth., Disp: ,  Rfl:    amLODipine (NORVASC) 10 MG tablet, Take 1 tablet (10 mg total) by mouth daily., Disp: 90 tablet, Rfl: 1   aspirin EC 81 MG tablet, Take by mouth., Disp: , Rfl:    atorvastatin (LIPITOR) 40 MG tablet, Take 1 tablet (40 mg total) by mouth daily., Disp: 90 tablet, Rfl: 1   blood glucose meter kit and supplies, Dispense based on patient and insurance preference. Use once daily as directed. (FOR ICD-10 E10.9, E11.9). STrips 100 Lancets 100, Disp: 1 each, Rfl: 0   Cholecalciferol (VITAMIN D3) 2000 UNITS capsule, VITAMIN D3, 2000UNIT (Oral Capsule)  2 po qday for 0 days  Quantity: 60.00;  Refills: 0   Ordered :15-May-2010  Steele Sizer MD;  Buddy Duty 05-October-2008 Active, Disp: , Rfl:    citalopram (CELEXA) 10 MG tablet, Take 1 tablet (10 mg total) by mouth daily., Disp: 90 tablet, Rfl: 0   Lancets (ONETOUCH DELICA PLUS [id number]) MISC, Use as directed to check blood sugar, Disp: , Rfl:    memantine (NAMENDA) 10 MG tablet, Take 10 mg by mouth 2 (two) times daily., Disp: , Rfl:    metFORMIN (GLUCOPHAGE) 500 MG tablet, Take 1 tablet (500 mg total) by mouth every evening., Disp: 90 tablet, Rfl: 1   Multiple Vitamin (MULTI-VITAMIN) tablet, Take 1 tablet by mouth daily., Disp: , Rfl:    ONETOUCH ULTRA test strip, 1 each daily. Use as directed to  check blood sugar, Disp: , Rfl:    Specialty Vitamins Products (ONE-A-DAY MEMORY PO), Take 1 tablet by mouth 2 (two) times daily., Disp: , Rfl:    telmisartan-hydrochlorothiazide (MICARDIS HCT) 80-25 MG tablet, Take 1 tablet by mouth daily., Disp: 90 tablet, Rfl: 1   cetirizine (ZYRTEC) 10 MG tablet, Take 10 mg by mouth daily. (Patient not taking: No sig reported), Disp: , Rfl:    fluticasone (FLONASE) 50 MCG/ACT nasal spray, Place 2 sprays into both nostrils daily. (Patient not taking: No sig reported), Disp: 48 g, Rfl: 1   loratadine (CLARITIN) 10 MG tablet, Take 1 tablet (10 mg total) by mouth daily. (Patient not taking: No sig reported), Disp:  30 tablet, Rfl: 0  Allergies  Allergen Reactions   Oxycodone Other (See Comments)   Oxycodone Hcl     insomnia, agitation    ROS  Ten systems reviewed and is negative except as mentioned in HPI   Objective  Vitals:   04/17/20 0913 04/17/20 1006  BP: (!) 102/56 122/70  Pulse: 86   Resp: 16   Temp: 97.7 F (36.5 C)   TempSrc: Oral   SpO2: 98%   Weight: 204 lb (92.5 kg)   Height: _0  (1.499 m)     Body mass index is 41.2 kg/m.    Physical Exam  Constitutional: Patient appears well-developed and well-nourished. Obese No distress.  HEENT: head atraumatic, normocephalic, pupils equal and reactive to light,  neck supple, throat within normal limits Cardiovascular: Normal rate, regular rhythm and normal heart sounds.  No murmur heard. No BLE edema. Pulmonary/Chest: Effort normal and breath sounds normal. No respiratory distress. Abdominal: Soft.  There is no tenderness. Psychiatric: Patient has a normal mood and affect. behavior is normal. Judgment and thought content normal.  Recent Results (from the past 2160 hour(s))  POCT HgB A1C     Status: Abnormal   Collection Time: 03/23/20 12:48 PM  Result Value Ref Range   Hemoglobin A1C 6.1 (A) 4.0 - 5.6 %   HbA1c POC (<> result, manual entry)     HbA1c, POC (prediabetic range)     HbA1c, POC (controlled diabetic range)       Assessment & Plan  1. Taste perversion  Seen by ENT trying new mouth wash, discussed trying food without any added salt. Like hard boiled eggs, rice. Cook without adding any salt  2. Weight loss  Reviewed records and weight goes up and down, but stable over the past year  3. Cold intolerance  TSH has been normal, discussed layers

## 2020-05-09 ENCOUNTER — Other Ambulatory Visit: Payer: Self-pay | Admitting: Family Medicine

## 2020-05-09 DIAGNOSIS — Z780 Asymptomatic menopausal state: Secondary | ICD-10-CM

## 2020-05-09 DIAGNOSIS — Z1231 Encounter for screening mammogram for malignant neoplasm of breast: Secondary | ICD-10-CM

## 2020-05-17 ENCOUNTER — Other Ambulatory Visit: Payer: Self-pay

## 2020-05-17 ENCOUNTER — Ambulatory Visit
Admission: RE | Admit: 2020-05-17 | Discharge: 2020-05-17 | Disposition: A | Discharge status: Home / Self Care | Payer: HMO | Source: Ambulatory Visit | Attending: Family Medicine | Admitting: Family Medicine

## 2020-05-17 DIAGNOSIS — Z1231 Encounter for screening mammogram for malignant neoplasm of breast: Secondary | ICD-10-CM | POA: Insufficient documentation

## 2020-05-24 ENCOUNTER — Other Ambulatory Visit: Payer: Self-pay | Admitting: Family Medicine

## 2020-05-24 DIAGNOSIS — F418 Other specified anxiety disorders: Secondary | ICD-10-CM

## 2020-05-26 ENCOUNTER — Other Ambulatory Visit: Payer: Self-pay | Admitting: Family Medicine

## 2020-05-26 DIAGNOSIS — I10 Essential (primary) hypertension: Secondary | ICD-10-CM

## 2020-05-26 DIAGNOSIS — E1122 Type 2 diabetes mellitus with diabetic chronic kidney disease: Secondary | ICD-10-CM

## 2020-05-26 DIAGNOSIS — N182 Chronic kidney disease, stage 2 (mild): Secondary | ICD-10-CM

## 2020-05-30 NOTE — Progress Notes (Unsigned)
Name: Yvonne Curtis   MRN: [id number]    DOB: 12/18/1950   Date:06/01/2020       Progress Note  Subjective  Chief Complaint  Acute visit: Salty taste  HPI  Taster perversion weight loss: husband and patient states she continues to think that everything is very salty, even a pancake tasted salted to her, she is now eating only one toast sometimes . Symptoms waxes and wanes. She has lost another 11 lbs since last month. She eats only one toast at times, husband has tried giving her protein shakes but she states it does not taste right. She has cognitive impairment . She is seeing Dr. Melrose Nakayama also seen by ENT - Dr Tami Ribas and was advised to use a mouthwash .MRI brain normal back in 09/2019. Discussed checking inflammatory markers and also TSH since she has noticed cold intolerance. Husband changed thermostat to 85 degree and she was still cold. Going on for months   MDD: pha9 positive, husband thinks she is feeling worse, patient states she is fine. She no longer can do things she enjoyed like dancing and reading She still talks on the phone with family. Relatives noticed her memory is not as good . She does not feel bad about herself.    Patient Active Problem List   Diagnosis Date Noted  . History of colonic polyps   . Polyp of transverse colon   . Abnormal sense of taste 12/19/2019  . Loss of memory 12/19/2019  . Mood changes 12/19/2019  . Acanthosis nigricans 11/14/2014  . Benign hypertension 11/14/2014  . Dyslipidemia 11/14/2014  . Gastro-esophageal reflux disease without esophagitis 11/14/2014  . Extreme obesity 11/14/2014  . Primary osteoarthritis of both knees 11/14/2014  . Microalbuminuria 10/15/2012  . Controlled type 2 diabetes mellitus with microalbuminuria ([place]) 06/05/2009  . Body dermatophytosis 12/13/2008    Past Surgical History:  Procedure Laterality Date  . CHOLECYSTECTOMY    . COLONOSCOPY WITH PROPOFOL N/A 11/30/2014   Procedure: COLONOSCOPY WITH PROPOFOL;   Surgeon: Lucilla Lame, MD;  Location: [facility];  Service: Endoscopy;  Laterality: N/A;  WITH BIOPSY-- TRANSVERSE COLON POLYP  X 2 DESCENING COLON POLYP  . COLONOSCOPY WITH PROPOFOL N/A 01/17/2020   Procedure: COLONOSCOPY WITH PROPOFOL;  Surgeon: Lucilla Lame, MD;  Location: [facility]COPY;  Service: Endoscopy;  Laterality: N/A;  . GALLBLADDER SURGERY    . SEPTOPLASTY    . tonsillectomy    . TONSILLECTOMY    . TUBAL LIGATION      Family History  Problem Relation Age of Onset  . Colon cancer Mother   . Lung cancer Father   . Hypertension Sister   . Hypertension Sister   . Breast cancer Neg Hx     Social History   Tobacco Use  . Smoking status: Never Smoker  . Smokeless tobacco: Never Used  Substance Use Topics  . Alcohol use: No    Alcohol/week: 0.0 standard drinks     Current Outpatient Medications:  .  amLODipine (NORVASC) 10 MG tablet, Take 1 tablet (10 mg total) by mouth daily., Disp: 90 tablet, Rfl: 1 .  atorvastatin (LIPITOR) 40 MG tablet, Take 1 tablet (40 mg total) by mouth daily., Disp: 90 tablet, Rfl: 1 .  blood glucose meter kit and supplies, Dispense based on patient and insurance preference. Use once daily as directed. (FOR ICD-10 E10.9, E11.9). STrips 100 Lancets 100, Disp: 1 each, Rfl: 0 .  Cholecalciferol (VITAMIN D3) 2000 UNITS capsule, VITAMIN D3, 2000UNIT (Oral Capsule)  2 po qday for 0 days  Quantity: 60.00;  Refills: 0   Ordered :15-May-2010  Steele Sizer MD;  Started 05-October-2008 Active, Disp: , Rfl:  .  citalopram (CELEXA) 10 MG tablet, TAKE 1 TABLET BY MOUTH  DAILY, Disp: 90 tablet, Rfl: 0 .  donepezil (ARICEPT) 10 MG tablet, , Disp: , Rfl:  .  fluticasone (FLONASE) 50 MCG/ACT nasal spray, Place 2 sprays into both nostrils daily., Disp: 48 g, Rfl: 1 .  Lancets (ONETOUCH DELICA PLUS [id number]) MISC, Use as directed to check blood sugar, Disp: , Rfl:  .  memantine (NAMENDA) 10 MG tablet, Take 10 mg by mouth 2 (two) times daily., Disp: , Rfl:  .   metFORMIN (GLUCOPHAGE) 500 MG tablet, Take 1 tablet (500 mg total) by mouth every evening., Disp: 90 tablet, Rfl: 1 .  Multiple Vitamin (MULTI-VITAMIN) tablet, Take 1 tablet by mouth daily., Disp: , Rfl:  .  ONETOUCH ULTRA test strip, 1 each daily. Use as directed to check blood sugar, Disp: , Rfl:  .  Specialty Vitamins Products (ONE-A-DAY MEMORY PO), Take 1 tablet by mouth 2 (two) times daily., Disp: , Rfl:  .  telmisartan-hydrochlorothiazide (MICARDIS HCT) 80-25 MG tablet, TAKE 1 TABLET BY MOUTH  DAILY, Disp: 90 tablet, Rfl: 1 .  acetaminophen (TYLENOL) 500 MG tablet, Take by mouth. (Patient not taking: Reported on 06/01/2020), Disp: , Rfl:   Allergies  Allergen Reactions  . Oxycodone Other (See Comments)  . Oxycodone Hcl     insomnia, agitation    I personally reviewed active problem list, medication list, allergies, family history, social history with the patient/caregiver today.   ROS  Constitutional: Negative for fever, positive for  weight change.  Respiratory: Negative for cough and shortness of breath.   Cardiovascular: Negative for chest pain or palpitations.  Gastrointestinal: Negative for abdominal pain, no bowel changes.  Musculoskeletal: Negative for gait problem or joint swelling.  Skin: Negative for rash.  Neurological: Negative for dizziness or headache.  No other specific complaints in a complete review of systems (except as listed in HPI above).  Objective  Vitals:   06/01/20 1424  BP: 110/64  Pulse: 97  Resp: 16  Temp: 98.2 F (36.8 C)  TempSrc: Oral  SpO2: 95%  Weight: 191 lb 12.8 oz (87 kg)  Height: 4\' 11"  (1.499 m)    Body mass index is 38.74 kg/m.  Physical Exam    Constitutional: Patient appears well-developed and well-nourished. Obese No distress.  HEENT: head atraumatic, normocephalic, pupils equal and reactive to light,  neck supple Cardiovascular: Normal rate, regular rhythm and normal heart sounds.  No murmur heard. No BLE  edema. Pulmonary/Chest: Effort normal and breath sounds normal. No respiratory distress. Abdominal: Soft.  There is no tenderness. Psychiatric: Patient has a normal mood and affect. behavior is normal. Judgment and thought content normal.  Recent Results (from the past 2160 hour(s))  POCT HgB A1C     Status: Abnormal   Collection Time: 03/23/20 12:48 PM  Result Value Ref Range   Hemoglobin A1C 6.1 (A) 4.0 - 5.6 %   HbA1c POC (<> result, manual entry)     HbA1c, POC (prediabetic range)     HbA1c, POC (controlled diabetic range)       PHQ2/9: Depression screen [facility] 2/9 06/01/2020 04/17/2020 03/23/2020 12/22/2019 11/24/2019  Decreased Interest 3 3 2  0 0  Down, Depressed, Hopeless 0 0 0 0 0  PHQ - 2 Score 3 3 2  0 0  Altered sleeping 3  0 0 0 0  Tired, decreased energy 3 1 1 2  0  Change in appetite 3 2 0 2 0  Feeling bad or failure about yourself  0 0 0 0 0  Trouble concentrating 3 2 3  0 1  Moving slowly or fidgety/restless 3 0 0 0 0  Suicidal thoughts 0 0 0 0 0  PHQ-9 Score 18 8 6 4 1   Difficult doing work/chores Very difficult Very difficult Very difficult - Not difficult at all  Some recent data might be hidden    phq 9 is positive   Fall Risk: Fall Risk  06/01/2020 04/17/2020 03/23/2020 11/24/2019 10/20/2019  Falls in the past year? 1 1 0 0 0  Number falls in past yr: 1 1 0 0 0  Injury with Fall? 0 0 0 0 0  Risk for fall due to : History of fall(s) History of fall(s) - - -  Follow up - Falls prevention discussed - - Falls evaluation completed     Functional Status Survey: Is the patient deaf or have difficulty hearing?: No Does the patient have difficulty seeing, even when wearing glasses/contacts?: No Does the patient have difficulty concentrating, remembering, or making decisions?: Yes Does the patient have difficulty walking or climbing stairs?: Yes Does the patient have difficulty dressing or bathing?: Yes Does the patient have difficulty doing errands alone such as  visiting a doctor's office or shopping?: Yes    Assessment & Plan  1. Taste perversion  - AMB Referral to [facility]  2. Weight loss  - TSH - Sedimentation rate - C-reactive protein - AMB Referral to [facility]  3. Cold intolerance  - AMB Referral to [facility].  3. Cold intolerance  - AMB Referral to [facility]   4. Moderate episode of recurrent major depressive disorder (HCC)  On citalopram

## 2020-06-01 ENCOUNTER — Other Ambulatory Visit: Payer: Self-pay

## 2020-06-01 ENCOUNTER — Ambulatory Visit (INDEPENDENT_AMBULATORY_CARE_PROVIDER_SITE_OTHER): Payer: HMO | Admitting: Family Medicine

## 2020-06-01 ENCOUNTER — Encounter: Payer: Self-pay | Admitting: Family Medicine

## 2020-06-01 VITALS — BP 110/64 | HR 97 | Temp 98.2°F | Resp 16 | Ht 59.0 in | Wt 191.8 lb

## 2020-06-01 DIAGNOSIS — F331 Major depressive disorder, recurrent, moderate: Secondary | ICD-10-CM

## 2020-06-01 DIAGNOSIS — F418 Other specified anxiety disorders: Secondary | ICD-10-CM

## 2020-06-01 DIAGNOSIS — N182 Chronic kidney disease, stage 2 (mild): Secondary | ICD-10-CM

## 2020-06-01 DIAGNOSIS — I10 Essential (primary) hypertension: Secondary | ICD-10-CM

## 2020-06-01 DIAGNOSIS — E1122 Type 2 diabetes mellitus with diabetic chronic kidney disease: Secondary | ICD-10-CM

## 2020-06-01 DIAGNOSIS — R6889 Other general symptoms and signs: Secondary | ICD-10-CM

## 2020-06-01 DIAGNOSIS — R432 Parageusia: Secondary | ICD-10-CM | POA: Diagnosis not present

## 2020-06-01 DIAGNOSIS — R634 Abnormal weight loss: Secondary | ICD-10-CM

## 2020-06-01 MED ORDER — CITALOPRAM HYDROBROMIDE 10 MG PO TABS: 10.0000 mg | ORAL_TABLET | Freq: Every day | ORAL | 0 refills | Status: DC

## 2020-06-01 MED ORDER — METFORMIN HCL 500 MG PO TABS
500.0000 mg | ORAL_TABLET | Freq: Every evening | ORAL | 1 refills | Status: DC
Start: 2020-06-01 — End: 2020-09-04

## 2020-06-01 MED ORDER — TELMISARTAN-HCTZ 80-25 MG PO TABS: 1.0000 | ORAL_TABLET | Freq: Every day | ORAL | 1 refills | Status: DC

## 2020-06-01 MED ORDER — AMLODIPINE BESYLATE 10 MG PO TABS: 10.0000 mg | ORAL_TABLET | Freq: Every day | ORAL | 1 refills | Status: DC

## 2020-06-02 LAB — SEDIMENTATION RATE: Sed Rate: 6 mm/h (ref 0–30)

## 2020-06-02 LAB — C-REACTIVE PROTEIN: CRP: 4.2 mg/L

## 2020-06-02 LAB — TSH: TSH: 0.29 mIU/L — ABNORMAL LOW (ref 0.40–4.50)

## 2020-06-04 ENCOUNTER — Telehealth: Payer: Self-pay | Admitting: *Deleted

## 2020-06-04 NOTE — Chronic Care Management (AMB) (Signed)
  Chronic Care Management   Note  06/04/2020 Name: Yvonne Curtis MRN: [id number] DOB: [birth year]  Yvonne Curtis is a 70 y.o. year old female who is a primary care patient of Steele Sizer, MD. I reached out to [name] by phone today in response to a referral sent by Ms. Lorilynn G Lora's PCP,Sowles, Drue Stager, MD.  Yvonne Curtis was given information about Chronic Care Management services today including:  1. CCM service includes personalized support from designated clinical staff supervised by her physician, including individualized plan of care and coordination with other care providers 2. 24/7 contact phone numbers for assistance for urgent and routine care needs. 3. Service will only be billed when office clinical staff spend 20 minutes or more in a month to coordinate care. 4. Only one practitioner may furnish and bill the service in a calendar month. 5. The patient may stop CCM services at any time (effective at the end of the month) by phone call to the office staff. 6. The patient will be responsible for cost sharing (co-pay) of up to 20% of the service fee (after annual deductible is met).  Patient agreed to services and verbal consent obtained.   Follow up plan: Telephone appointment with care management team member scheduled for:  RNCM on 12/11/7845  Licensed Clinical Social Worker 06/15/2020  [facility]

## 2020-06-04 NOTE — Chronic Care Management (AMB) (Signed)
  Chronic Care Management   Outreach Note  06/04/2020 Name: Yvonne Curtis MRN: [id number] DOB: 1951-04-11  Yvonne Curtis is a 70 y.o. year old female who is a primary care patient of Steele Sizer, MD. I reached out to [name] by phone today in response to a referral sent by Ms. [place]bo Bitting's PCP, Steele Sizer, MD.  An unsuccessful telephone outreach was attempted today. The patient was referred to the case management team for assistance with care management and care coordination.   Follow Up Plan: A HIPAA compliant phone message was left for the patient providing contact information and requesting a return call. The care management team will reach out to the patient again over the next 7 days. If patient returns call to provider office, please advise to call [facility] at [phone].  [facility]

## 2020-06-11 ENCOUNTER — Ambulatory Visit (INDEPENDENT_AMBULATORY_CARE_PROVIDER_SITE_OTHER): Payer: HMO

## 2020-06-11 DIAGNOSIS — R432 Parageusia: Secondary | ICD-10-CM

## 2020-06-11 DIAGNOSIS — F09 Unspecified mental disorder due to known physiological condition: Secondary | ICD-10-CM

## 2020-06-11 DIAGNOSIS — E1122 Type 2 diabetes mellitus with diabetic chronic kidney disease: Secondary | ICD-10-CM

## 2020-06-11 DIAGNOSIS — I10 Essential (primary) hypertension: Secondary | ICD-10-CM

## 2020-06-11 NOTE — Chronic Care Management (AMB) (Signed)
Chronic Care Management   Initial Visit Note  06/11/2020 Name: Yvonne Curtis MRN: [id number] DOB: 12-Oct-1950  Primary Care Provider: Steele Sizer, MD Reason for referral : Chronic Care Management  Yvonne Curtis is a 70 y.o. year old female who is a primary care patient of Steele Sizer, MD. The CCM team was consulted for assistance with chronic disease management and care coordination.  Review of Yvonne Curtis's status, including review of consultants reports, relevant labs and test results was conducted today. Collaboration with appropriate care team members was performed as part of the comprehensive evaluation and provision of chronic care management services.    SDOH (Social Determinants of Health) assessments performed: Yes See Care Plan activities for detailed interventions related to SDOH  SDOH Interventions   Flowsheet Row Most Recent Value  SDOH Interventions   Food Insecurity Interventions Intervention Not Indicated  Transportation Interventions Intervention Not Indicated      Medications: Outpatient Encounter Medications as of 06/11/2020  Medication Sig  . acetaminophen (TYLENOL) 500 MG tablet Take by mouth.  Marland Kitchen amLODipine (NORVASC) 10 MG tablet Take 1 tablet (10 mg total) by mouth daily.  Marland Kitchen atorvastatin (LIPITOR) 40 MG tablet Take 1 tablet (40 mg total) by mouth daily.  . blood glucose meter kit and supplies Dispense based on patient and insurance preference. Use once daily as directed. (FOR ICD-10 E10.9, E11.9). STrips 100 Lancets 100  . Cholecalciferol (VITAMIN D3) 2000 UNITS capsule VITAMIN D3, 2000UNIT (Oral Capsule)  2 po qday for 0 days  Quantity: 60.00;  Refills: 0   Ordered :15-May-2010  Steele Sizer MD;  Buddy Duty 05-October-2008 Active  . citalopram (CELEXA) 10 MG tablet Take 1 tablet (10 mg total) by mouth daily.  . Lancets (ONETOUCH DELICA PLUS [id number]) MISC Use as directed to check blood sugar  . memantine (NAMENDA) 10 MG tablet Take 10 mg by  mouth 2 (two) times daily.  . metFORMIN (GLUCOPHAGE) 500 MG tablet Take 1 tablet (500 mg total) by mouth every evening.  Yvonne Curtis ULTRA test strip 1 each daily. Use as directed to check blood sugar  . telmisartan-hydrochlorothiazide (MICARDIS HCT) 80-25 MG tablet Take 1 tablet by mouth daily.  Marland Kitchen donepezil (ARICEPT) 10 MG tablet  (Patient not taking: Reported on 06/11/2020)  . fluticasone (FLONASE) 50 MCG/ACT nasal spray Place 2 sprays into both nostrils daily. (Patient not taking: Reported on 06/11/2020)  . Multiple Vitamin (MULTI-VITAMIN) tablet Take 1 tablet by mouth daily. (Patient not taking: Reported on 06/11/2020)  . Specialty Vitamins Products (ONE-A-DAY MEMORY PO) Take 1 tablet by mouth 2 (two) times daily. (Patient not taking: Reported on 06/11/2020)   No facility-administered encounter medications on file as of 06/11/2020.     Objective: Patient Care Plan: Diabetes Type 2 (Adult)    Problem Identified: Disease Progression (Diabetes, Type 2)     Long-Range Goal: Disease Progression Prevented or Minimized   Start Date: 06/11/2020  Expected End Date: 10/09/2020  Priority: High  Note:   Objective:  Lab Results  Component Value Date   HGBA1C 6.1 (A) 03/23/2020 .   Lab Results  Component Value Date   CREATININE 1.05 (H) 08/09/2019   CREATININE 1.03 (H) 11/16/2018   CREATININE 0.90 10/14/2017   . No results found for: EGFR  Current Barriers:  . Chronic disease management and support related to Diabetes self-management  Case Manager Clinical Goal(s):  Over the next 120 days, patient will: Marland Kitchen Demonstrate improved adherence to prescribed treatment plan for Diabetes self management as evidenced by  monitoring and recording of CBG, adherence to ADA/ carb modified diet and adherence to prescribed medication regimen.  Interventions:  . Collaboration with Steele Sizer, MD regarding development and update of comprehensive plan of care as evidenced by provider attestation and  co-signature . Inter-disciplinary care team collaboration (see longitudinal plan of care) . Reviewed medications. Encouraged to take medications as prescribed and notify team with concerns regarding prescription cost. . Provided information regarding importance of consistent blood glucose monitoring. Encouraged to monitor and maintain a log. Discussed importance of monitoring for signs of hypoglycemia d/t decreased intake. Patient and spouse verbalized awareness of appropriate interventions for low readings.  . Discussed nutritional intake. Reports weight loss of 13 lbs within the last month. Reports daily intake has decreased d/t altered sense of taste. Denies nausea or vomiting. Discussed diabetic friendly meal supplements and importance of staying hydrated. She has supplements available but still having difficulty tolerating d/t altered taste. Reports adequate fluid intake and currently eating mostly toast during the day. Agreed to continue monitoring weight and eating as tolerated. Discussed indications for notifying a provider. . Discussed importance of completing recommended DM preventive care exams.  Patient Goals/Self-Care Activities Over the next 120 days, patient will:  - Self administer oral medications as prescribed - Attend all scheduled provider appointments - Monitor blood glucose levels, record readings and utilize recommended interventions - Maintain adequate hydration and increase intake of nutritional meal supplements    when unable to tolerate meals. - Notify provider or care management team with health related questions or new concerns  Follow Up Plan:  -Will follow up within a month.     Patient Care Plan: Wellness (Adult)    Problem Identified: Cognitive Function (Wellness)     Long-Range Goal: Cognitive Function Enhanced   Start Date: 06/11/2020  Expected End Date: 10/09/2020  Priority: High  Note:   Current Barriers:        Chronic Care Management support needs  r/t cognitive function.  Clinical Goal(s):  Marland Kitchen       Over the next 90 days, patient will not experience complications r/t decline in cognitive function.  Interventions:   Collaboration with Steele Sizer, MD regarding development and update of comprehensive plan of             care as evidenced by provider attestation and co-signature.  Inter-disciplinary care team collaboration (see longitudinal plan of care)  Discussed current ability to perform self care. Discussed concerns r/t cognitive decline. Reports              performing self-care tasks independently but requires assistance with IADL's. Reports spouse is             available to provide transportation and assist as needed. Declined current need for additional             assistance in the home. Agreed to update the team if her needs change.        Discussed activity level, safety, and fall prevention measures. Discussed potential medication side           effects and risks r/t low blood sugars Encouraged to keep assistive device readily available to use as       needed.         Reviewed medications and pending Neurology appointment with Dr. Melrose Nakayama. Reports taking Namenda        as prescribed. Pending follow-up with Dr. Melrose Nakayama in April.  Patient Goals/Self Care Activities:  Over the next 90 days,  patient will:  - Take medications as prescribed. - Adhere to recommended fall prevention measures - Update the care management team if in-home assistance is needed. - Attend follow up with Dr. Melrose Nakayama as scheduled.  Follow Up Plan:  - Will follow up next month.   Patient Care Plan: Hypertension (Adult)    Problem Identified: Hypertension (Hypertension)     Long-Range Goal: Hypertension Management   Start Date: 06/11/2020  Expected End Date: 10/09/2020  Note:   Objective:  . Last practice recorded BP readings:  BP Readings from Last 3 Encounters:  06/01/20 110/64  04/17/20 122/70  04/17/20 (!) 102/56 .   Marland Kitchen Most recent  eGFR/CrCl: No results found for: EGFR  No components found for: CRCL  Current Barriers:  . Chronic disease management needs and support r/t Hypertension self-management.  Case Manager Clinical Goal(s):  Marland Kitchen Over the next 120 days, patient will demonstrate adherence to prescribed treatment plan for             hypertension as evidenced by taking all medications as prescribed, monitoring and recording blood             pressure as directed, and adhering to a cardiac prudent diet.  Interventions:  . Collaboration with Steele Sizer, MD regarding development and update of comprehensive plan of             care as evidenced by provider attestation and co-signature .       Inter-disciplinary care team collaboration (see longitudinal plan of care) . Provided education regarding prevention of complications such as stroke and heart attack. . Reviewed medications. Encouraged to take medications as prescribed and notify provider if unable to             tolerate the prescribed regimen. Encouraged to notify care management team with concerns             regarding medication management or prescription cost.  . Provided information regarding established BP parameters and indications for notifying a provider.             Encouraged to monitor and record readings.    Patient Goals/Self-Care Activities Over the next 120 days, patient will:  - Self administer medications as prescribed - Attend all scheduled provider appointments - Call provider office for new concerns or BP outside of established parameters - Monitor BP and record readings - Adhere to recommended cardiac prudent diet  Follow Up Plan:  Will follow up within the next month     Ms. Curtis was given information about Chronic Care Management services including:  1. CCM service includes personalized support from designated clinical staff supervised by her physician, including individualized plan of care and coordination with other care  providers 2. 24/7 contact phone numbers for assistance for urgent and routine care needs. 3. Service will only be billed when office clinical staff spend 20 minutes or more in a month to coordinate care. 4. Only one practitioner may furnish and bill the service in a calendar month. 5. The patient may stop CCM services at any time (effective at the end of the month) by phone call to the office staff. 6. The patient will be responsible for cost sharing (co-pay) of up to 20% of the service fee (after annual deductible is met).  Patient agreed to services and verbal consent obtained.   PLAN A member of the care management team will follow-up with Yvonne Curtis within the next month.   [facility]  [employer] [phone]

## 2020-06-12 NOTE — Patient Instructions (Signed)
Thank you for allowing the Chronic Care Management team to participate in your care.   Patient Care Plan: Diabetes Type 2 (Adult)    Problem Identified: Disease Progression (Diabetes, Type 2)     Long-Range Goal: Disease Progression Prevented or Minimized   Start Date: 06/11/2020  Expected End Date: 10/09/2020  Priority: High  Note:   Objective:  Lab Results  Component Value Date   HGBA1C 6.1 (A) 03/23/2020 .   Lab Results  Component Value Date   CREATININE 1.05 (H) 08/09/2019   CREATININE 1.03 (H) 11/16/2018   CREATININE 0.90 10/14/2017   . No results found for: EGFR  Current Barriers:  . Chronic disease management and support related to Diabetes self-management  Case Manager Clinical Goal(s):  Over the next 120 days, patient will: Marland Kitchen Demonstrate improved adherence to prescribed treatment plan for Diabetes self management as evidenced by monitoring and recording of CBG, adherence to ADA/ carb modified diet and adherence to prescribed medication regimen.  Interventions:  . Collaboration with Steele Sizer, MD regarding development and update of comprehensive plan of care as evidenced by provider attestation and co-signature . Inter-disciplinary care team collaboration (see longitudinal plan of care) . Reviewed medications. Encouraged to take medications as prescribed and notify team with concerns regarding prescription cost. . Provided information regarding importance of consistent blood glucose monitoring. Encouraged to monitor and maintain a log. Discussed importance of monitoring for signs of hypoglycemia d/t decreased intake. Patient and spouse verbalized awareness of appropriate interventions for low readings.  . Discussed nutritional intake. Reports weight loss of 13 lbs within the last month. Reports daily intake has decreased d/t altered sense of taste. Denies nausea or vomiting. Discussed diabetic friendly meal supplements and importance of staying hydrated. She has  supplements available but still having difficulty tolerating d/t altered taste. Reports adequate fluid intake and currently eating mostly toast during the day. Agreed to continue monitoring weight and eating as tolerated. Discussed indications for notifying a provider. . Discussed importance of completing recommended DM preventive care exams.  Patient Goals/Self-Care Activities Over the next 120 days, patient will:  - Self administer oral medications as prescribed - Attend all scheduled provider appointments - Monitor blood glucose levels, record readings and utilize recommended interventions - Maintain adequate hydration and increase intake of nutritional meal supplements    when unable to tolerate meals. - Notify provider or care management team with health related questions or new concerns  Follow Up Plan:  -Will follow up within a month.     Patient Care Plan: Wellness (Adult)    Problem Identified: Cognitive Function (Wellness)     Long-Range Goal: Cognitive Function Enhanced   Start Date: 06/11/2020  Expected End Date: 10/09/2020  Priority: High  Note:   Current Barriers:        Chronic Care Management support needs r/t cognitive function.  Clinical Goal(s):  Marland Kitchen       Over the next 90 days, patient will not experience complications r/t decline in cognitive function.  Interventions:   Collaboration with Steele Sizer, MD regarding development and update of comprehensive plan of             care as evidenced by provider attestation and co-signature.  Inter-disciplinary care team collaboration (see longitudinal plan of care)  Discussed current ability to perform self care. Discussed concerns r/t cognitive decline. Reports              performing self-care tasks independently but requires assistance with IADL's. Reports spouse is  available to provide transportation and assist as needed. Declined current need for additional             assistance in the home.  Agreed to update the team if her needs change.        Discussed activity level, safety, and fall prevention measures. Discussed potential medication side           effects and risks r/t low blood sugars Encouraged to keep assistive device readily available to use as       needed.         Reviewed medications and pending Neurology appointment with Dr. Melrose Nakayama. Reports taking Namenda        as prescribed. Pending follow-up with Dr. Melrose Nakayama in April.  Patient Goals/Self Care Activities:  Over the next 90 days, patient will:  - Take medications as prescribed. - Adhere to recommended fall prevention measures - Update the care management team if in-home assistance is needed. - Attend follow up with Dr. Melrose Nakayama as scheduled.  Follow Up Plan:  - Will follow up next month.   Patient Care Plan: Hypertension (Adult)    Problem Identified: Hypertension (Hypertension)     Long-Range Goal: Hypertension Management   Start Date: 06/11/2020  Expected End Date: 10/09/2020  Note:   Objective:  . Last practice recorded BP readings:  BP Readings from Last 3 Encounters:  06/01/20 110/64  04/17/20 122/70  04/17/20 (!) 102/56 .   Marland Kitchen Most recent eGFR/CrCl: No results found for: EGFR  No components found for: CRCL  Current Barriers:  . Chronic disease management needs and support r/t Hypertension self-management.  Case Manager Clinical Goal(s):  Marland Kitchen Over the next 120 days, patient will demonstrate adherence to prescribed treatment plan for             hypertension as evidenced by taking all medications as prescribed, monitoring and recording blood             pressure as directed, and adhering to a cardiac prudent diet.  Interventions:  . Collaboration with Steele Sizer, MD regarding development and update of comprehensive plan of             care as evidenced by provider attestation and co-signature .       Inter-disciplinary care team collaboration (see longitudinal plan of care) . Provided education  regarding prevention of complications such as stroke and heart attack. . Reviewed medications. Encouraged to take medications as prescribed and notify provider if unable to             tolerate the prescribed regimen. Encouraged to notify care management team with concerns             regarding medication management or prescription cost.  . Provided information regarding established BP parameters and indications for notifying a provider.             Encouraged to monitor and record readings.    Patient Goals/Self-Care Activities Over the next 120 days, patient will:  - Self administer medications as prescribed - Attend all scheduled provider appointments - Call provider office for new concerns or BP outside of established parameters - Monitor BP and record readings - Adhere to recommended cardiac prudent diet  Follow Up Plan:  Will follow up within the next month     Ms. Suman was given information about Chronic Care Management services including:  1. CCM service includes personalized support from designated clinical staff supervised by her physician, including individualized plan of  care and coordination with other care providers 2. 24/7 contact phone numbers for assistance for urgent and routine care needs. 3. Service will only be billed when office clinical staff spend 20 minutes or more in a month to coordinate care. 4. Only one practitioner may furnish and bill the service in a calendar month. 5. The patient may stop CCM services at any time (effective at the end of the month) by phone call to the office staff. 6. The patient will be responsible for cost sharing (co-pay) of up to 20% of the service fee (after annual deductible is met).  Patient agreed to services and verbal consent obtained.    Mrs. Smyre and her spouse verbalized understanding of the information discussed during the telephonic outreach today. Declined need for mailed/printed information. A member of the care  management team will follow-up with Mrs. Stefanko within the next month.   [facility] [phone]

## 2020-06-15 ENCOUNTER — Ambulatory Visit: Payer: HMO | Admitting: *Deleted

## 2020-06-15 DIAGNOSIS — F09 Unspecified mental disorder due to known physiological condition: Secondary | ICD-10-CM

## 2020-06-15 DIAGNOSIS — F418 Other specified anxiety disorders: Secondary | ICD-10-CM

## 2020-06-15 DIAGNOSIS — N182 Chronic kidney disease, stage 2 (mild): Secondary | ICD-10-CM | POA: Diagnosis not present

## 2020-06-15 DIAGNOSIS — E1122 Type 2 diabetes mellitus with diabetic chronic kidney disease: Secondary | ICD-10-CM

## 2020-06-15 DIAGNOSIS — Z0000 Encounter for general adult medical examination without abnormal findings: Secondary | ICD-10-CM

## 2020-06-15 DIAGNOSIS — I10 Essential (primary) hypertension: Secondary | ICD-10-CM | POA: Diagnosis not present

## 2020-06-15 NOTE — Patient Instructions (Signed)
Visit Information  PATIENT GOALS:  Goals Addressed            This Visit's Progress   . Find Help in My Community       Timeframe:  Long-Range Goal Priority:  Medium Start Date:    06/15/20                         Expected End Date:    11/23/20                   Follow Up Date 06/29/20   - begin a notebook of services in my neighborhood or community - follow-up on any referrals for help I am given - think ahead to make sure my need does not become an emergency - make a list of family or friends that I can call    Why is this important?    Knowing how and where to find help for yourself or family in your neighborhood and community is an important skill.   You will want to take some steps to learn how.    Notes:         The patient verbalized understanding of instructions, educational materials, and care plan provided today and declined offer to receive copy of patient instructions, educational materials, and care plan.   Telephone follow up appointment with care management team member scheduled for: 06/28/20  Elliot Gurney, [place] Worker  [facility] [phone]

## 2020-06-15 NOTE — Chronic Care Management (AMB) (Signed)
Chronic Care Management    Clinical Social Work Note  06/15/2020 Name: Yvonne Curtis MRN: [id number] DOB: June 25, 1950  Yvonne Curtis is a 70 y.o. year old female who is a primary care patient of Steele Sizer, MD. The CCM team was consulted to assist the patient with chronic disease management and/or care coordination needs related to: [employer] .   Engaged with patient by telephone for initial visit in response to provider referral for social work chronic care management and care coordination services.   Consent to Services:  The patient was given information about Chronic Care Management services, agreed to services, and gave verbal consent prior to initiation of services.  Please see initial visit note for detailed documentation.   Patient agreed to services and consent obtained.   Assessment: Review of patient past medical history, allergies, medications, and health status, including review of relevant consultants reports was performed today as part of a comprehensive evaluation and provision of chronic care management and care coordination services.     SDOH (Social Determinants of Health) assessments and interventions performed:    Advanced Directives Status: Not addressed in this encounter.  CCM Care Plan  Allergies  Allergen Reactions  . Oxycodone Other (See Comments)  . Oxycodone Hcl     insomnia, agitation    Outpatient Encounter Medications as of 06/15/2020  Medication Sig  . acetaminophen (TYLENOL) 500 MG tablet Take by mouth.  Marland Kitchen amLODipine (NORVASC) 10 MG tablet Take 1 tablet (10 mg total) by mouth daily.  Marland Kitchen atorvastatin (LIPITOR) 40 MG tablet Take 1 tablet (40 mg total) by mouth daily.  . blood glucose meter kit and supplies Dispense based on patient and insurance preference. Use once daily as directed. (FOR ICD-10 E10.9, E11.9). STrips 100 Lancets 100  . Cholecalciferol (VITAMIN D3) 2000 UNITS capsule VITAMIN D3, 2000UNIT (Oral Capsule)  2 po  qday for 0 days  Quantity: 60.00;  Refills: 0   Ordered :15-May-2010  Steele Sizer MD;  Buddy Duty 05-October-2008 Active  . citalopram (CELEXA) 10 MG tablet Take 1 tablet (10 mg total) by mouth daily.  Marland Kitchen donepezil (ARICEPT) 10 MG tablet  (Patient not taking: Reported on 06/11/2020)  . fluticasone (FLONASE) 50 MCG/ACT nasal spray Place 2 sprays into both nostrils daily. (Patient not taking: Reported on 06/11/2020)  . Lancets (ONETOUCH DELICA PLUS [id number]) MISC Use as directed to check blood sugar  . memantine (NAMENDA) 10 MG tablet Take 10 mg by mouth 2 (two) times daily.  . metFORMIN (GLUCOPHAGE) 500 MG tablet Take 1 tablet (500 mg total) by mouth every evening.  . Multiple Vitamin (MULTI-VITAMIN) tablet Take 1 tablet by mouth daily. (Patient not taking: Reported on 06/11/2020)  . ONETOUCH ULTRA test strip 1 each daily. Use as directed to check blood sugar  . Specialty Vitamins Products (ONE-A-DAY MEMORY PO) Take 1 tablet by mouth 2 (two) times daily. (Patient not taking: Reported on 06/11/2020)  . telmisartan-hydrochlorothiazide (MICARDIS HCT) 80-25 MG tablet Take 1 tablet by mouth daily.   No facility-administered encounter medications on file as of 06/15/2020.    Patient Active Problem List   Diagnosis Date Noted  . History of colonic polyps   . Polyp of transverse colon   . Abnormal sense of taste 12/19/2019  . Loss of memory 12/19/2019  . Mood changes 12/19/2019  . Acanthosis nigricans 11/14/2014  . Benign hypertension 11/14/2014  . Dyslipidemia 11/14/2014  . Gastro-esophageal reflux disease without esophagitis 11/14/2014  . Extreme obesity 11/14/2014  . Primary osteoarthritis of  both knees 11/14/2014  . Microalbuminuria 10/15/2012  . Controlled type 2 diabetes mellitus with microalbuminuria ([place]) 06/05/2009  . Body dermatophytosis 12/13/2008    Conditions to be addressed/monitored: Dementia; Memory Deficits  Care Plan : General Social Work (Adult)  Updates made by Vern Claude, LCSW since 06/15/2020 12:00 AM    Problem: Caregiver Stress     Long-Range Goal: Caregiver Coping Optimized   Start Date: 06/15/2020  Expected End Date: 10/12/2020  This Visit's Progress: On track  Priority: Medium  Note:   Current Barriers:  . Level of care concerns, Memory Deficits, Inability to perform IADL's independently, and Lacks knowledge of community resource: related to appropriate Day Programs . Unable to perform IADLs independently  Clinical Social Work Clinical Goal(s):  Marland Kitchen Over the next 90 days, patient will work with SW to address concerns related to assistance with patient's socialization  Interventions: . 1:1 collaboration with Steele Sizer, MD regarding development and update of comprehensive plan of care as evidenced by provider attestation and co-signature . Inter-disciplinary care team collaboration (see longitudinal plan of care) . Patient interviewed along with her spouse and appropriate assessments performed . [name] discussed concerns about patient's memory challenges and her tendency to isolate herself at home . [name] confirms that he is patient's primary caregiver, assists with ADL's and IADL's , transportation to medical appointments and assistance with meals . [name] does not allow patient to leave the home unsupervised due to safety concern-patient rarely left alone . Provided patient with information about the [facility] to assist with socialization . Discussed plans with patient for ongoing care management follow up and provided patient with direct contact information for care management team  Patient Goals/Self-Care Activities Over the next 90 days, patient will:   - Patient will attend all scheduled provider appointments - begin a notebook of services in my neighborhood or community - follow-up on any referrals for help I am given, specifically [facility] - think ahead to make sure my need  does not become an emergency - make a list of family or friends that I can call    Follow up Plan: SW will follow up with patient by phone over the next 14 business day s       Task: Recognize and Manage Caregiver Stress   Due Date: 10/12/2020  Priority: Routine  Note:   Care Management Activities:    - active listening utilized - caregiver stress acknowledged - decision-making supported - positive reinforcement provided - social and community activities encouraged    Notes:       Follow Up Plan: SW will follow up with patient by phone over the next 14 business days      [place], [place] Worker  [facility] Care Management [phone]

## 2020-06-19 DIAGNOSIS — Z03818 Encounter for observation for suspected exposure to other biological agents ruled out: Secondary | ICD-10-CM | POA: Diagnosis not present

## 2020-06-19 DIAGNOSIS — Z20822 Contact with and (suspected) exposure to covid-19: Secondary | ICD-10-CM | POA: Diagnosis not present

## 2020-06-21 DIAGNOSIS — R439 Unspecified disturbances of smell and taste: Secondary | ICD-10-CM | POA: Diagnosis not present

## 2020-06-21 DIAGNOSIS — F0391 Unspecified dementia with behavioral disturbance: Secondary | ICD-10-CM | POA: Diagnosis not present

## 2020-06-25 ENCOUNTER — Ambulatory Visit: Payer: Self-pay | Admitting: *Deleted

## 2020-06-25 DIAGNOSIS — N182 Chronic kidney disease, stage 2 (mild): Secondary | ICD-10-CM | POA: Diagnosis not present

## 2020-06-25 DIAGNOSIS — E1122 Type 2 diabetes mellitus with diabetic chronic kidney disease: Secondary | ICD-10-CM | POA: Diagnosis not present

## 2020-06-25 DIAGNOSIS — R6889 Other general symptoms and signs: Secondary | ICD-10-CM

## 2020-06-25 DIAGNOSIS — F09 Unspecified mental disorder due to known physiological condition: Secondary | ICD-10-CM

## 2020-06-25 DIAGNOSIS — F418 Other specified anxiety disorders: Secondary | ICD-10-CM | POA: Diagnosis not present

## 2020-06-25 DIAGNOSIS — R432 Parageusia: Secondary | ICD-10-CM | POA: Diagnosis not present

## 2020-06-25 DIAGNOSIS — R413 Other amnesia: Secondary | ICD-10-CM | POA: Diagnosis not present

## 2020-06-25 DIAGNOSIS — I10 Essential (primary) hypertension: Secondary | ICD-10-CM | POA: Diagnosis not present

## 2020-06-25 DIAGNOSIS — R4586 Emotional lability: Secondary | ICD-10-CM | POA: Diagnosis not present

## 2020-06-26 NOTE — Chronic Care Management (AMB) (Signed)
Chronic Care Management    Clinical Social Work Note  06/26/2020 Name: Yvonne Curtis MRN: [id number] DOB: Dec 19, 1950  Yvonne Curtis is a 70 y.o. year old female who is a primary care patient of Steele Sizer, MD. The CCM team was consulted to assist the patient with chronic disease management and/or care coordination needs related to: [employer] .   Engaged with patient's spouse by telephone for follow up visit in response to provider referral for social work chronic care management and care coordination services.   Consent to Services:  The patient was given information about Chronic Care Management services, agreed to services, and gave verbal consent prior to initiation of services.  Please see initial visit note for detailed documentation.   Patient agreed to services and consent obtained.   Assessment: Review of patient past medical history, allergies, medications, and health status, including review of relevant consultants reports was performed today as part of a comprehensive evaluation and provision of chronic care management and care coordination services.     SDOH (Social Determinants of Health) assessments and interventions performed:    Advanced Directives Status: Not addressed in this encounter.  CCM Care Plan  Allergies  Allergen Reactions  . Oxycodone Other (See Comments)  . Oxycodone Hcl     insomnia, agitation    Outpatient Encounter Medications as of 06/25/2020  Medication Sig  . acetaminophen (TYLENOL) 500 MG tablet Take by mouth.  Marland Kitchen amLODipine (NORVASC) 10 MG tablet Take 1 tablet (10 mg total) by mouth daily.  Marland Kitchen atorvastatin (LIPITOR) 40 MG tablet Take 1 tablet (40 mg total) by mouth daily.  . blood glucose meter kit and supplies Dispense based on patient and insurance preference. Use once daily as directed. (FOR ICD-10 E10.9, E11.9). STrips 100 Lancets 100  . Cholecalciferol (VITAMIN D3) 2000 UNITS capsule VITAMIN D3, 2000UNIT (Oral  Capsule)  2 po qday for 0 days  Quantity: 60.00;  Refills: 0   Ordered :15-May-2010  Steele Sizer MD;  Buddy Duty 05-October-2008 Active  . citalopram (CELEXA) 10 MG tablet Take 1 tablet (10 mg total) by mouth daily.  Marland Kitchen donepezil (ARICEPT) 10 MG tablet  (Patient not taking: Reported on 06/11/2020)  . fluticasone (FLONASE) 50 MCG/ACT nasal spray Place 2 sprays into both nostrils daily. (Patient not taking: Reported on 06/11/2020)  . Lancets (ONETOUCH DELICA PLUS [id number]) MISC Use as directed to check blood sugar  . memantine (NAMENDA) 10 MG tablet Take 10 mg by mouth 2 (two) times daily.  . metFORMIN (GLUCOPHAGE) 500 MG tablet Take 1 tablet (500 mg total) by mouth every evening.  . Multiple Vitamin (MULTI-VITAMIN) tablet Take 1 tablet by mouth daily. (Patient not taking: Reported on 06/11/2020)  . ONETOUCH ULTRA test strip 1 each daily. Use as directed to check blood sugar  . Specialty Vitamins Products (ONE-A-DAY MEMORY PO) Take 1 tablet by mouth 2 (two) times daily. (Patient not taking: Reported on 06/11/2020)  . telmisartan-hydrochlorothiazide (MICARDIS HCT) 80-25 MG tablet Take 1 tablet by mouth daily.   No facility-administered encounter medications on file as of 06/25/2020.    Patient Active Problem List   Diagnosis Date Noted  . History of colonic polyps   . Polyp of transverse colon   . Abnormal sense of taste 12/19/2019  . Loss of memory 12/19/2019  . Mood changes 12/19/2019  . Acanthosis nigricans 11/14/2014  . Benign hypertension 11/14/2014  . Dyslipidemia 11/14/2014  . Gastro-esophageal reflux disease without esophagitis 11/14/2014  . Extreme obesity 11/14/2014  . Primary  osteoarthritis of both knees 11/14/2014  . Microalbuminuria 10/15/2012  . Controlled type 2 diabetes mellitus with microalbuminuria ([place]) 06/05/2009  . Body dermatophytosis 12/13/2008    Conditions to be addressed/monitored: Dementia;  Memory Deficits and Lacks knowledge of community resource: related to  Dementia Support  Care Plan : General Social Work (Adult)  Updates made by Vern Claude, LCSW since 06/26/2020 12:00 AM    Problem: Caregiver Stress     Long-Range Goal: Caregiver Coping Optimized   Start Date: 06/15/2020  Expected End Date: 10/12/2020  This Visit's Progress: On track  Recent Progress: On track  Priority: Medium  Note:   Current Barriers:  . Level of care concerns, Memory Deficits, Inability to perform IADL's independently, and Lacks knowledge of community resource: related to appropriate Day Programs . Unable to perform IADLs independently  Clinical Social Work Clinical Goal(s):  Marland Kitchen Over the next 90 days, patient will work with SW to address concerns related to assistance with patient's socialization  Interventions: . 1:1 collaboration with Steele Sizer, MD regarding development and update of comprehensive plan of care as evidenced by provider attestation and co-signature . Inter-disciplinary care team collaboration (see longitudinal plan of care) . [name] interviewed and appropriate assessments performed . [name] discussed concerns about patient's memory challenges and her tendency to isolate herself at home . Discussed follow up recommendations from Neurology visit today . [name] confirmed plan to encourage daily exercise, patient enrolled in dance class at the local park and will also plan to walk for 15 minutes daily as recommended by Neurologist . [name] confirms that he has contacted the [facility] and they should begin to open at the beginning of next month . [name] provided with reassurance and positive reinforcement for motivation to provide care . Assessed for additional community resource needs . Discussed plans with patient for ongoing care management follow up and provided patient with direct contact information for care management team  Patient Goals/Self-Care Activities Over the  next 90 days, patient will:   - Patient will attend all scheduled provider appointments - begin a notebook of services in my neighborhood or community - follow-up on any referrals for help I am given, specifically [facility] - think ahead to make sure my need does not become an emergency - make a list of family or friends that I can call    Follow up Plan: SW will follow up with patient by phone over the next 14 business day s          Follow Up Plan: SW will follow up with patient by phone over the next 14 business days      [employer], [place] Worker  [facility] [phone]

## 2020-06-26 NOTE — Patient Instructions (Signed)
Visit Information  Goals Addressed            This Visit's Progress   . Find Help in My Community       Timeframe:  Long-Range Goal Priority:  Medium Start Date:    06/15/20                         Expected End Date:    11/23/20                   Follow Up Date 07/09/20   - begin a notebook of services in my neighborhood or community - continue to follow-up on any referrals for help I am given- I.e. [facility] - think ahead to make sure my need does not become an emergency - make a list of family or friends that I can call  - begin Zumba at the local park and daily walks-15 minutes per day    Why is this important?    Knowing how and where to find help for yourself or family in your neighborhood and community is an important skill.   You will want to take some steps to learn how.    Notes:        The patient verbalized understanding of instructions, educational materials, and care plan provided today and declined offer to receive copy of patient instructions, educational materials, and care plan.   Telephone follow up appointment with care management team member scheduled for: 07/09/20    Elliot Gurney, [place] Worker  [facility] [phone]

## 2020-06-29 ENCOUNTER — Telehealth: Payer: Self-pay

## 2020-07-02 ENCOUNTER — Other Ambulatory Visit: Payer: Self-pay

## 2020-07-02 ENCOUNTER — Ambulatory Visit: Payer: HMO

## 2020-07-02 VITALS — BP 132/80

## 2020-07-02 DIAGNOSIS — Z0130 Encounter for examination of blood pressure without abnormal findings: Secondary | ICD-10-CM

## 2020-07-03 ENCOUNTER — Telehealth: Payer: Self-pay

## 2020-07-03 ENCOUNTER — Other Ambulatory Visit: Payer: Self-pay | Admitting: Family Medicine

## 2020-07-03 DIAGNOSIS — R432 Parageusia: Secondary | ICD-10-CM

## 2020-07-03 DIAGNOSIS — F09 Unspecified mental disorder due to known physiological condition: Secondary | ICD-10-CM

## 2020-07-03 NOTE — Telephone Encounter (Signed)
Informed Yvonne Curtis that referral has been placed and someone for our referral department or someone form [facility] will be giving him a call. Husband verbalized understanding

## 2020-07-03 NOTE — Telephone Encounter (Signed)
Pt requesting a return call. Her husband would like a second opinion on her memory loss. States that pt is really going down hill. Pt would like to go to [facility]. Ask to speak to [name] (husband).

## 2020-07-04 ENCOUNTER — Ambulatory Visit (INDEPENDENT_AMBULATORY_CARE_PROVIDER_SITE_OTHER): Payer: HMO

## 2020-07-04 DIAGNOSIS — N182 Chronic kidney disease, stage 2 (mild): Secondary | ICD-10-CM

## 2020-07-04 NOTE — Chronic Care Management (AMB) (Signed)
Chronic Care Management   Follow Up Note   07/04/2020 Name: Yvonne Curtis MRN: [id number] DOB: Aug 20, 1950  Primary Care Provider: Steele Sizer, MD Reason for referral : Chronic Care Management   Yvonne Curtis is a 70 y.o. year old female who is a primary care patient of Steele Sizer, MD. The CCM team was consulted for assistance with chronic disease management and care coordination needs.    Review of Yvonne Curtis's status, including review of consultants reports, relevant labs and test results was conducted today. Collaboration with appropriate care team members was performed as part of the comprehensive evaluation and provision of chronic care management services.    SDOH (Social Determinants of Health) assessments performed: No     Outpatient Encounter Medications as of 07/04/2020  Medication Sig  . acetaminophen (TYLENOL) 500 MG tablet Take by mouth.  Marland Kitchen amLODipine (NORVASC) 10 MG tablet Take 1 tablet (10 mg total) by mouth daily.  Marland Kitchen atorvastatin (LIPITOR) 40 MG tablet Take 1 tablet (40 mg total) by mouth daily.  . blood glucose meter kit and supplies Dispense based on patient and insurance preference. Use once daily as directed. (FOR ICD-10 E10.9, E11.9). STrips 100 Lancets 100  . Cholecalciferol (VITAMIN D3) 2000 UNITS capsule VITAMIN D3, 2000UNIT (Oral Capsule)  2 po qday for 0 days  Quantity: 60.00;  Refills: 0   Ordered :15-May-2010  Steele Sizer MD;  Buddy Duty 05-October-2008 Active  . citalopram (CELEXA) 10 MG tablet Take 1 tablet (10 mg total) by mouth daily.  Marland Kitchen donepezil (ARICEPT) 10 MG tablet  (Patient not taking: Reported on 06/11/2020)  . fluticasone (FLONASE) 50 MCG/ACT nasal spray Place 2 sprays into both nostrils daily. (Patient not taking: Reported on 06/11/2020)  . Lancets (ONETOUCH DELICA PLUS [id number]) MISC Use as directed to check blood sugar  . memantine (NAMENDA) 10 MG tablet Take 10 mg by mouth 2 (two) times daily.  . metFORMIN (GLUCOPHAGE) 500  MG tablet Take 1 tablet (500 mg total) by mouth every evening.  . Multiple Vitamin (MULTI-VITAMIN) tablet Take 1 tablet by mouth daily. (Patient not taking: Reported on 06/11/2020)  . ONETOUCH ULTRA test strip 1 each daily. Use as directed to check blood sugar  . Specialty Vitamins Products (ONE-A-DAY MEMORY PO) Take 1 tablet by mouth 2 (two) times daily. (Patient not taking: Reported on 06/11/2020)  . telmisartan-hydrochlorothiazide (MICARDIS HCT) 80-25 MG tablet Take 1 tablet by mouth daily.   No facility-administered encounter medications on file as of 07/04/2020.     Objective:  Patient Care Plan: Diabetes Type 2 (Adult)    Problem Identified: Disease Progression (Diabetes, Type 2)     Long-Range Goal: Disease Progression Prevented or Minimized   Start Date: 06/11/2020  Expected End Date: 10/09/2020  Priority: High  Note:   Objective:  Lab Results  Component Value Date   HGBA1C 6.1 (A) 03/23/2020 .   Lab Results  Component Value Date   CREATININE 1.05 (H) 08/09/2019   CREATININE 1.03 (H) 11/16/2018   CREATININE 0.90 10/14/2017   . No results found for: EGFR  Current Barriers:  . Chronic disease management and support related to Diabetes self-management  Case Manager Clinical Goal(s):  Marland Kitchen Over the next 120 days, patient will demonstrate improved adherence to prescribed treatment plan for Diabetes self management as evidenced by monitoring and recording of CBG, adherence to ADA/ carb modified diet and adherence to prescribed medication regimen.  Interventions:  . Collaboration with Steele Sizer, MD regarding development and update of comprehensive plan  of care as evidenced by provider attestation and co-signature . Inter-disciplinary care team collaboration (see longitudinal plan of care) . Reviewed medications and compliance with daily blood glucose monitoring. Reports taking medications as prescribed. Reports home blood glucose readings have been within range. However, she  experienced a significant drop while participating in an exercise class. Reports not eating enough prior to exercising and passed out during the class. EMS was notified. Reports she was stable and was not transported to the Emergency Room. Denies other incidents of low readings. . Discussed nutritional intake and increased risks for hypoglycemia. Reports her intake has slightly improved but still decreased d/t altered sense of taste. She is attempting to incorporate fruit, yogurt and protein shakes/supplements. We discussed several meal options to increase her daily intake. Encouraged to continue incorporating foods and eating small meals as tolerated. Encouraged to always keep fast acting sugars on hand. Strongly advised to avoid prolonged activity d/t her risk for hypoglycemia. Her husband is very supportive and monitors her closely. He is aware of indications for contacting a provider and seeking immediate medical attention.   Patient Goals/Self-Care Activities Over the next 120 days, patient will:  - Self administer oral medications as prescribed - Attend all scheduled provider appointments - Monitor blood glucose levels, record readings and utilize recommended interventions - Maintain adequate hydration and increase intake of nutritional meal supplements    when unable to tolerate regular meals. - Notify provider or care management team with health related questions or new concerns  Follow Up Plan:  -Will follow up within the next month       PLAN A member of the care management team will follow up with Yvonne Curtis within the next month.    [facility] [phone]

## 2020-07-09 ENCOUNTER — Ambulatory Visit: Payer: Self-pay | Admitting: *Deleted

## 2020-07-09 DIAGNOSIS — F418 Other specified anxiety disorders: Secondary | ICD-10-CM

## 2020-07-09 DIAGNOSIS — E1122 Type 2 diabetes mellitus with diabetic chronic kidney disease: Secondary | ICD-10-CM | POA: Diagnosis not present

## 2020-07-09 DIAGNOSIS — N182 Chronic kidney disease, stage 2 (mild): Secondary | ICD-10-CM | POA: Diagnosis not present

## 2020-07-09 DIAGNOSIS — F419 Anxiety disorder, unspecified: Secondary | ICD-10-CM

## 2020-07-09 DIAGNOSIS — F09 Unspecified mental disorder due to known physiological condition: Secondary | ICD-10-CM

## 2020-07-10 NOTE — Chronic Care Management (AMB) (Signed)
Chronic Care Management    Clinical Social Work Note  07/10/2020 Name: Yvonne Curtis MRN: [id number] DOB: 1950-07-15  Yvonne Curtis is a 70 y.o. year old female who is a primary care patient of Steele Sizer, MD. The CCM team was consulted to assist the patient with chronic disease management and/or care coordination needs related to: [employer] .   Engaged with patient by telephone for follow up visit in response to provider referral for social work chronic care management and care coordination services.   Consent to Services:  The patient was given information about Chronic Care Management services, agreed to services, and gave verbal consent prior to initiation of services.  Please see initial visit note for detailed documentation.   Patient agreed to services and consent obtained.   Assessment: Review of patient past medical history, allergies, medications, and health status, including review of relevant consultants reports was performed today as part of a comprehensive evaluation and provision of chronic care management and care coordination services.     SDOH (Social Determinants of Health) assessments and interventions performed:    Advanced Directives Status: Not addressed in this encounter.  CCM Care Plan  Allergies  Allergen Reactions  . Oxycodone Other (See Comments)  . Oxycodone Hcl     insomnia, agitation    Outpatient Encounter Medications as of 07/09/2020  Medication Sig  . acetaminophen (TYLENOL) 500 MG tablet Take by mouth.  Marland Kitchen amLODipine (NORVASC) 10 MG tablet Take 1 tablet (10 mg total) by mouth daily.  Marland Kitchen atorvastatin (LIPITOR) 40 MG tablet Take 1 tablet (40 mg total) by mouth daily.  . blood glucose meter kit and supplies Dispense based on patient and insurance preference. Use once daily as directed. (FOR ICD-10 E10.9, E11.9). STrips 100 Lancets 100  . Cholecalciferol (VITAMIN D3) 2000 UNITS capsule VITAMIN D3, 2000UNIT (Oral Capsule)  2 po  qday for 0 days  Quantity: 60.00;  Refills: 0   Ordered :15-May-2010  Steele Sizer MD;  Buddy Duty 05-October-2008 Active  . citalopram (CELEXA) 10 MG tablet Take 1 tablet (10 mg total) by mouth daily.  Marland Kitchen donepezil (ARICEPT) 10 MG tablet  (Patient not taking: Reported on 06/11/2020)  . fluticasone (FLONASE) 50 MCG/ACT nasal spray Place 2 sprays into both nostrils daily. (Patient not taking: Reported on 06/11/2020)  . Lancets (ONETOUCH DELICA PLUS [id number]) MISC Use as directed to check blood sugar  . memantine (NAMENDA) 10 MG tablet Take 10 mg by mouth 2 (two) times daily.  . metFORMIN (GLUCOPHAGE) 500 MG tablet Take 1 tablet (500 mg total) by mouth every evening.  . Multiple Vitamin (MULTI-VITAMIN) tablet Take 1 tablet by mouth daily. (Patient not taking: Reported on 06/11/2020)  . ONETOUCH ULTRA test strip 1 each daily. Use as directed to check blood sugar  . Specialty Vitamins Products (ONE-A-DAY MEMORY PO) Take 1 tablet by mouth 2 (two) times daily. (Patient not taking: Reported on 06/11/2020)  . telmisartan-hydrochlorothiazide (MICARDIS HCT) 80-25 MG tablet Take 1 tablet by mouth daily.   No facility-administered encounter medications on file as of 07/09/2020.    Patient Active Problem List   Diagnosis Date Noted  . History of colonic polyps   . Polyp of transverse colon   . Abnormal sense of taste 12/19/2019  . Loss of memory 12/19/2019  . Mood changes 12/19/2019  . Acanthosis nigricans 11/14/2014  . Benign hypertension 11/14/2014  . Dyslipidemia 11/14/2014  . Gastro-esophageal reflux disease without esophagitis 11/14/2014  . Extreme obesity 11/14/2014  . Primary osteoarthritis  of both knees 11/14/2014  . Microalbuminuria 10/15/2012  . Controlled type 2 diabetes mellitus with microalbuminuria ([place]) 06/05/2009  . Body dermatophytosis 12/13/2008    Conditions to be addressed/monitored: Dementia; Memory Deficits  Care Plan : General Social Work (Adult)  Updates made by Vern Claude, LCSW since 07/10/2020 12:00 AM    Problem: Caregiver Stress     Long-Range Goal: Caregiver Coping Optimized   Start Date: 06/15/2020  Expected End Date: 10/12/2020  This Visit's Progress: On track  Recent Progress: On track  Priority: Medium  Note:   Current Barriers:  . Level of care concerns, Memory Deficits, Inability to perform IADL's independently, and Lacks knowledge of community resource: related to appropriate Day Programs . Unable to perform IADLs independently  Clinical Social Work Clinical Goal(s):  Marland Kitchen Over the next 90 days, patient will work with SW to address concerns related to assistance with patient's socialization  Interventions: . 1:1 collaboration with Steele Sizer, MD regarding development and update of comprehensive plan of care as evidenced by provider attestation and co-signature . Inter-disciplinary care team collaboration (see longitudinal plan of care) . [name] interviewed and appropriate assessments performed . [name] continues to express concerns about patient's memory challenges and her loss of appetite . [name] confirmed working with patient's provider regarding appetite concerns . [name] confirmed plan to encourage daily exercise, patient enrolled in dance class at the local park was able to attend one class but had a fainting episode. Spouse feels this may be due to lack of appetite . [name] confirms that he has contacted the [facility] and they should begin to open on 07/23/20 . [name] provided with reassurance and positive reinforcement for motivation to provide care . Assessed for additional community resource needs and reinforced need to call the Day Program on 07/23/20 . Discussed plans with patient for ongoing care management follow up and provided patient with direct contact information for care management team  Patient Goals/Self-Care Activities Over the next 90 days,  patient will:   - Patient will attend all scheduled provider appointments - begin a notebook of services in my neighborhood or community - follow-up on any referrals for help I am given, specifically [facility] - think ahead to make sure my need does not become an emergency - make a list of family or friends that I can call    Follow up Plan: SW will follow up with patient by phone over the next 14 business day s          Follow Up Plan: SW will follow up with patient by phone over the next 07/23/20      Elliot Gurney, [place] Worker  [facility] [phone]

## 2020-07-10 NOTE — Patient Instructions (Signed)
Visit Information  Goals Addressed            This Visit's Progress   . Find Help in My Community       Timeframe:  Long-Range Goal Priority:  Medium Start Date:    06/15/20                         Expected End Date:    11/23/20                   Follow Up Date 07/24/20   - begin a notebook of services in my neighborhood or community - continue to follow-up on any referrals for help I am given- I.e. [facility] - think ahead to make sure my need does not become an emergency - make a list of family or friends that I can call  - begin Zumba at the local park and daily walks-15 minutes per day    Why is this important?    Knowing how and where to find help for yourself or family in your neighborhood and community is an important skill.   You will want to take some steps to learn how.    Notes:        The patient verbalized understanding of instructions, educational materials, and care plan provided today and declined offer to receive copy of patient instructions, educational materials, and care plan.   Telephone follow up appointment with care management team member scheduled for:07/23/20  Elliot Gurney, [place] Worker  [facility] [phone]

## 2020-07-11 NOTE — Patient Instructions (Signed)
  Thank you for allowing the Chronic Care Management team to participate in your care.    Patient Care Plan: Diabetes Type 2 (Adult)    Problem Identified: Disease Progression (Diabetes, Type 2)     Long-Range Goal: Disease Progression Prevented or Minimized   Start Date: 06/11/2020  Expected End Date: 10/09/2020  Priority: High  Note:   Objective:  Lab Results  Component Value Date   HGBA1C 6.1 (A) 03/23/2020 .   Lab Results  Component Value Date   CREATININE 1.05 (H) 08/09/2019   CREATININE 1.03 (H) 11/16/2018   CREATININE 0.90 10/14/2017   . No results found for: EGFR  Current Barriers:  . Chronic disease management and support related to Diabetes self-management  Case Manager Clinical Goal(s):  Marland Kitchen Over the next 120 days, patient will demonstrate improved adherence to prescribed treatment plan for Diabetes self management as evidenced by monitoring and recording of CBG, adherence to ADA/ carb modified diet and adherence to prescribed medication regimen.  Interventions:  . Collaboration with Steele Sizer, MD regarding development and update of comprehensive plan of care as evidenced by provider attestation and co-signature . Inter-disciplinary care team collaboration (see longitudinal plan of care) . Reviewed medications and compliance with daily blood glucose monitoring. Reports taking medications as prescribed. Reports home blood glucose readings have been within range. However, she experienced a significant drop while participating in an exercise class. Reports not eating enough prior to exercising and passed out during the class. EMS was notified. Reports she was stable and was not transported to the Emergency Room. Denies other incidents of low readings. . Discussed nutritional intake and increased risks for hypoglycemia. Reports her intake has slightly improved but still decreased d/t altered sense of taste. She is attempting to incorporate fruit, yogurt and protein  shakes/supplements. We discussed several meal options to increase her daily intake. Encouraged to continue incorporating foods and eating small meals as tolerated. Encouraged to always keep fast acting sugars on hand. Strongly advised to avoid prolonged activity d/t her risk for hypoglycemia. Her husband is very supportive and monitors her closely. He is aware of indications for contacting a provider and seeking immediate medical attention.   Patient Goals/Self-Care Activities Over the next 120 days, patient will:  - Self administer oral medications as prescribed - Attend all scheduled provider appointments - Monitor blood glucose levels, record readings and utilize recommended interventions - Maintain adequate hydration and increase intake of nutritional meal supplements    when unable to tolerate regular meals. - Notify provider or care management team with health related questions or new concerns  Follow Up Plan:  -Will follow up within the next month        Yvonne Curtis verbalized understanding of the information discussed during the telephonic outreach today. Declined need for mailed/printed instructions. A member of the care management team will follow up with Yvonne Curtis within the next month.    [facility] [phone]

## 2020-07-23 ENCOUNTER — Telehealth: Payer: Self-pay

## 2020-07-23 ENCOUNTER — Ambulatory Visit: Payer: Self-pay | Admitting: *Deleted

## 2020-07-23 DIAGNOSIS — R413 Other amnesia: Secondary | ICD-10-CM

## 2020-07-23 DIAGNOSIS — F418 Other specified anxiety disorders: Secondary | ICD-10-CM

## 2020-07-23 NOTE — Progress Notes (Signed)
Name: Yvonne Curtis   MRN: [id number]    DOB: [birth year]   Date:07/24/2020       Progress Note  Subjective  Chief Complaint  Follow Up  She came with her daughter [name] and her father   HPI  Memory loss:  Cognitive dysfunction started end of 2020, labs were done in our office and normal, she was referred to Dr. Melrose Nakayama because of progression of symptoms, failed MMS , she is now on Namenda and off Aricept because it caused nausea She is forgetful , husband is doing most of the cooking, also needs to  dispense her medications, and help her get dressed. On her last visit with Dr. Melrose Nakayama dose of Namenda was decreased to 5 mg BID and added Remeron at night, but her appetite is still very poor. Family asked for second opinion a few weeks ago  and referral was placed , [facility] for appointment at [facility].   DMII with renal manifestation: she has been taking Metformin daily and denies side effects,including no diarrhea, she denies hypoglycemic episode. HerhgbA1C was 5.7% ,  6.1 % today it is down to  She denies polyphagia, polyuria or polydipsia.Last urine micro negative but she has a history of microalbuminuria that resolved withARB.She continues to have poor food intake and has lost more weight, advised husband to hold Metformin but monitor glucose and if her appetite improves to resume medication   Hypercalcemia:denies taking otc supplementation, denies cramping or heartburn. No history of kidney stonesLast pth was normal , labs will be rechecked today   HTN: she takes medications daily, bp is at goal, no chest pain,  palpitation or dizineess. Unchanged   Hyperlipidemia: Taking Lipitor and denies side effects. No myalgia.She is due for labs today   OA knee: she states her knees causes pain on and off, she complained of some pain over the weekend, reminded husband it is okay to give her Tylenol prn   MDD: pha9 positive, husband thinks she is feeling worse, patient states she is fine. She  no longer can do things she enjoyed like dancing and reading She still talks on the phone with family. Relatives noticed her memory is not as good . She does not feel bad about herself.  Unchanged   Morbid obesity/Malnutrition: she is still obese, but has lost 37 lbs in the past year, she does not have an appetite, and also taste perversion - things tastes salty , most recently also complaining of stomach pain. She denies change in bowel movements. Colonoscopy is up to date, but going back to Dr. Durwin Reges for possible EGD and find out the reason of weight loss   Dry cough: Started this am, no fever or chills. She denies wheezing or SOIB. She also had a mild sore throat two days ago. She has a history of allergies and is not taking any  Medication at this time   Patient Active Problem List   Diagnosis Date Noted  . History of colonic polyps   . Polyp of transverse colon   . Abnormal sense of taste 12/19/2019  . Loss of memory 12/19/2019  . Mood changes 12/19/2019  . Acanthosis nigricans 11/14/2014  . Benign hypertension 11/14/2014  . Dyslipidemia 11/14/2014  . Gastro-esophageal reflux disease without esophagitis 11/14/2014  . Extreme obesity 11/14/2014  . Primary osteoarthritis of both knees 11/14/2014  . Microalbuminuria 10/15/2012  . Controlled type 2 diabetes mellitus with microalbuminuria ([place]) 06/05/2009  . Body dermatophytosis 12/13/2008    Past Surgical History:  Procedure Laterality Date  . CHOLECYSTECTOMY    . COLONOSCOPY WITH PROPOFOL N/A 11/30/2014   Procedure: COLONOSCOPY WITH PROPOFOL;  Surgeon: Lucilla Lame, MD;  Location: [facility];  Service: Endoscopy;  Laterality: N/A;  WITH BIOPSY-- TRANSVERSE COLON POLYP  X 2 DESCENING COLON POLYP  . COLONOSCOPY WITH PROPOFOL N/A 01/17/2020   Procedure: COLONOSCOPY WITH PROPOFOL;  Surgeon: Lucilla Lame, MD;  [facility] ENDOSCOPY;  Service: Endoscopy;  Laterality: N/A;  . GALLBLADDER SURGERY    . SEPTOPLASTY    .  tonsillectomy    . TONSILLECTOMY    . TUBAL LIGATION      Family History  Problem Relation Age of Onset  . Colon cancer Mother   . Lung cancer Father   . Hypertension Sister   . Hypertension Sister   . Breast cancer Neg Hx     Social History   Tobacco Use  . Smoking status: Never Smoker  . Smokeless tobacco: Never Used  Substance Use Topics  . Alcohol use: No    Alcohol/week: 0.0 standard drinks     Current Outpatient Medications:  .  mirtazapine (REMERON) 7.5 MG tablet, Take by mouth., Disp: , Rfl:  .  acetaminophen (TYLENOL) 500 MG tablet, Take by mouth., Disp: , Rfl:  .  amLODipine (NORVASC) 10 MG tablet, Take 1 tablet (10 mg total) by mouth daily., Disp: 90 tablet, Rfl: 1 .  atorvastatin (LIPITOR) 40 MG tablet, Take 1 tablet (40 mg total) by mouth daily., Disp: 90 tablet, Rfl: 1 .  blood glucose meter kit and supplies, Dispense based on patient and insurance preference. Use once daily as directed. (FOR ICD-10 E10.9, E11.9). STrips 100 Lancets 100, Disp: 1 each, Rfl: 0 .  cetirizine (ZYRTEC) 10 MG tablet, Take 10 mg by mouth daily as needed., Disp: , Rfl:  .  Cholecalciferol (VITAMIN D3) 2000 UNITS capsule, VITAMIN D3, 2000UNIT (Oral Capsule)  2 po qday for 0 days  Quantity: 60.00;  Refills: 0   Ordered :15-May-2010  Steele Sizer MD;  Buddy Duty 05-October-2008 Active, Disp: , Rfl:  .  citalopram (CELEXA) 10 MG tablet, Take 1 tablet (10 mg total) by mouth daily., Disp: 90 tablet, Rfl: 0 .  Lancets (ONETOUCH DELICA PLUS [id number]) MISC, Use as directed to check blood sugar, Disp: , Rfl:  .  memantine (NAMENDA) 10 MG tablet, Take 10 mg by mouth 2 (two) times daily., Disp: , Rfl:  .  memantine (NAMENDA) 5 MG tablet, Take 5 mg by mouth 2 (two) times daily., Disp: , Rfl:  .  metFORMIN (GLUCOPHAGE) 500 MG tablet, Take 1 tablet (500 mg total) by mouth every evening., Disp: 90 tablet, Rfl: 1 .  ONETOUCH ULTRA test strip, 1 each daily. Use as directed to check blood sugar, Disp: , Rfl:   .  telmisartan-hydrochlorothiazide (MICARDIS HCT) 80-25 MG tablet, Take 1 tablet by mouth daily., Disp: 90 tablet, Rfl: 1  Allergies  Allergen Reactions  . Oxycodone Other (See Comments)  . Oxycodone Hcl     insomnia, agitation    I personally reviewed active problem list, medication list, allergies, family history, social history, health maintenance with the patient/caregiver today.   ROS  Constitutional: Negative for fever , positive for  weight change.  Respiratory: positive  for cough but no  shortness of breath.   Cardiovascular: Negative for chest pain or palpitations.  Gastrointestinal: Negative for abdominal pain, no bowel changes.  Musculoskeletal: Negative for gait problem or joint swelling.  Skin: Negative for rash.  Neurological: Negative  for dizziness or headache.  No other specific complaints in a complete review of systems (except as listed in HPI above).  Objective  Vitals:   07/24/20 0822  BP: 134/70  Pulse: 97  Resp: 16  Temp: 98 F (36.7 C)  TempSrc: Oral  SpO2: 98%  Weight: 179 lb (81.2 kg)  Height: 5' (1.524 m)    Body mass index is 34.96 kg/m.  Physical Exam  Constitutional: Patient appears well-developed and malnourished . Obese  No distress.  HEENT: head atraumatic, normocephalic, pupils equal and reactive to light, neck supple Cardiovascular: Normal rate, regular rhythm and normal heart sounds.  No murmur heard. No BLE edema. Pulmonary/Chest: Effort normal and breath sounds normal. No respiratory distress. Abdominal: Soft.  There is no tenderness. Psychiatric: Patient has a normal mood and affect. behavior is normal. Judgment and thought content normal.  Recent Results (from the past 2160 hour(s))  TSH     Status: Abnormal   Collection Time: 06/01/20  3:37 PM  Result Value Ref Range   TSH 0.29 (L) 0.40 - 4.50 mIU/L  Sedimentation rate     Status: None   Collection Time: 06/01/20  3:37 PM  Result Value Ref Range   Sed Rate 6 0 - 30  mm/h  C-reactive protein     Status: None   Collection Time: 06/01/20  3:37 PM  Result Value Ref Range   CRP 4.2 <8.0 mg/L     PHQ2/9: Depression screen [facility] 2/9 07/24/2020 06/11/2020 06/01/2020 04/17/2020 03/23/2020  Decreased Interest 2 1 3 3 2   Down, Depressed, Hopeless 0 0 0 0 0  PHQ - 2 Score 2 1 3 3 2   Altered sleeping 0 - 3 0 0  Tired, decreased energy 0 - 3 1 1   Change in appetite 3 - 3 2 0  Feeling bad or failure about yourself  0 - 0 0 0  Trouble concentrating 0 - 3 2 3   Moving slowly or fidgety/restless 0 - 3 0 0  Suicidal thoughts 0 - 0 0 0  PHQ-9 Score 5 - 18 8 6   Difficult doing work/chores - - Very difficult Very difficult Very difficult  Some recent data might be hidden    phq 9 is positive   Fall Risk: Fall Risk  07/24/2020 07/04/2020 06/01/2020 04/17/2020 03/23/2020  Falls in the past year? 1 1 1 1  0  Number falls in past yr: 1 1 1 1  0  Injury with Fall? 0 0 0 0 0  Risk for fall due to : - History of fall(s);Other (Comment) History of fall(s) History of fall(s) -  Risk for fall due to: Comment - Reports recent fall r/t decreased intake - - -  Follow up - Falls prevention discussed - Falls prevention discussed -     Functional Status Survey: Is the patient deaf or have difficulty hearing?: No Does the patient have difficulty seeing, even when wearing glasses/contacts?: No Does the patient have difficulty concentrating, remembering, or making decisions?: Yes Does the patient have difficulty walking or climbing stairs?: Yes Does the patient have difficulty dressing or bathing?: Yes Does the patient have difficulty doing errands alone such as visiting a doctor's office or shopping?: Yes    Assessment & Plan  1. Controlled type 2 diabetes mellitus with stage 2 chronic kidney disease, without long-term current use of insulin (HCC)  - POCT HgB A1C - Microalbumin / creatinine urine ratio - COMPLETE METABOLIC PANEL WITH GFR  2. Morbid obesity ([place])  3.  Taste perversion  Seen by Dr. Tami Ribas and now seeing Dr. Durwin Reges, unknown cause  4. Benign hypertension  - CBC with Differential/Platelet  5. Memory loss  Getting [facility] opinion from [facility]   6. Abnormal TSH  - TSH  7. Dyslipidemia  - Lipid panel  8. Vitamin D deficiency  - VITAMIN D 25 Hydroxy (Vit-D Deficiency, Fractures)  9. Primary osteoarthritis of both knees  Reminded husband to give her tylenol prn   10. Moderate protein-calorie malnutrition (HCC)  Lost 37 lbs  11. Dry cough  - benzonatate (TESSALON) 100 MG capsule; Take 1-2 capsules (100-200 mg total) by mouth 2 (two) times daily as needed.  Dispense: 40 capsule; Refill: 0  12. Other seasonal allergic rhinitis  - cetirizine (ZYRTEC) 10 MG tablet; Take 1 tablet (10 mg total) by mouth daily as needed.  Dispense: 90 tablet; Refill: 1

## 2020-07-23 NOTE — Patient Instructions (Signed)
Visit Information  Goals Addressed            This Visit's Progress   . Find Help in My Community       Timeframe:  Long-Range Goal Priority:  Medium Start Date:    06/15/20                         Expected End Date:    11/23/20                   Follow Up Date 07/24/20   - begin a notebook of services in my neighborhood or community - continue to follow-up on any referrals for help I am given- I.e. [facility]-given direction to call back Wednesday  - think ahead to make sure my need does not become an emergency - make a list of family or friends that I can call  - begin Zumba at the local park and daily walks-15 minutes per day    Why is this important?    Knowing how and where to find help for yourself or family in your neighborhood and community is an important skill.   You will want to take some steps to learn how.    Notes:        The patient verbalized understanding of instructions, educational materials, and care plan provided today and declined offer to receive copy of patient instructions, educational materials, and care plan.   Telephone follow up appointment with care management team member scheduled for:  07/30/20 Elliot Gurney, [place] Worker  [facility] [phone]

## 2020-07-23 NOTE — Chronic Care Management (AMB) (Signed)
Chronic Care Management    Clinical Social Work Note  07/23/2020 Name: Yvonne Curtis MRN: [id number] DOB: 23-Aug-1950  Yvonne Curtis is a 70 y.o. year old female who is a primary care patient of Steele Sizer, MD. The CCM team was consulted to assist the patient with chronic disease management and/or care coordination needs related to: Caregiver Stress.   Engaged with patient's spouse by telephone for follow up visit in response to provider referral for social work chronic care management and care coordination services.   Consent to Services:  The patient was given information about Chronic Care Management services, agreed to services, and gave verbal consent prior to initiation of services.  Please see initial visit note for detailed documentation.   Patient agreed to services and consent obtained.   Assessment: Review of patient past medical history, allergies, medications, and health status, including review of relevant consultants reports was performed today as part of a comprehensive evaluation and provision of chronic care management and care coordination services.     SDOH (Social Determinants of Health) assessments and interventions performed:    Advanced Directives Status: Not addressed in this encounter.  CCM Care Plan  Allergies  Allergen Reactions  . Oxycodone Other (See Comments)  . Oxycodone Hcl     insomnia, agitation    Outpatient Encounter Medications as of 07/23/2020  Medication Sig  . acetaminophen (TYLENOL) 500 MG tablet Take by mouth.  Marland Kitchen amLODipine (NORVASC) 10 MG tablet Take 1 tablet (10 mg total) by mouth daily.  Marland Kitchen atorvastatin (LIPITOR) 40 MG tablet Take 1 tablet (40 mg total) by mouth daily.  . blood glucose meter kit and supplies Dispense based on patient and insurance preference. Use once daily as directed. (FOR ICD-10 E10.9, E11.9). STrips 100 Lancets 100  . Cholecalciferol (VITAMIN D3) 2000 UNITS capsule VITAMIN D3, 2000UNIT (Oral  Capsule)  2 po qday for 0 days  Quantity: 60.00;  Refills: 0   Ordered :15-May-2010  Steele Sizer MD;  Buddy Duty 05-October-2008 Active  . citalopram (CELEXA) 10 MG tablet Take 1 tablet (10 mg total) by mouth daily.  Marland Kitchen donepezil (ARICEPT) 10 MG tablet  (Patient not taking: Reported on 06/11/2020)  . fluticasone (FLONASE) 50 MCG/ACT nasal spray Place 2 sprays into both nostrils daily. (Patient not taking: Reported on 06/11/2020)  . Lancets (ONETOUCH DELICA PLUS [id number]) MISC Use as directed to check blood sugar  . memantine (NAMENDA) 10 MG tablet Take 10 mg by mouth 2 (two) times daily.  . metFORMIN (GLUCOPHAGE) 500 MG tablet Take 1 tablet (500 mg total) by mouth every evening.  . Multiple Vitamin (MULTI-VITAMIN) tablet Take 1 tablet by mouth daily. (Patient not taking: Reported on 06/11/2020)  . ONETOUCH ULTRA test strip 1 each daily. Use as directed to check blood sugar  . Specialty Vitamins Products (ONE-A-DAY MEMORY PO) Take 1 tablet by mouth 2 (two) times daily. (Patient not taking: Reported on 06/11/2020)  . telmisartan-hydrochlorothiazide (MICARDIS HCT) 80-25 MG tablet Take 1 tablet by mouth daily.   No facility-administered encounter medications on file as of 07/23/2020.    Patient Active Problem List   Diagnosis Date Noted  . History of colonic polyps   . Polyp of transverse colon   . Abnormal sense of taste 12/19/2019  . Loss of memory 12/19/2019  . Mood changes 12/19/2019  . Acanthosis nigricans 11/14/2014  . Benign hypertension 11/14/2014  . Dyslipidemia 11/14/2014  . Gastro-esophageal reflux disease without esophagitis 11/14/2014  . Extreme obesity 11/14/2014  . Primary osteoarthritis  of both knees 11/14/2014  . Microalbuminuria 10/15/2012  . Controlled type 2 diabetes mellitus with microalbuminuria ([place]) 06/05/2009  . Body dermatophytosis 12/13/2008    Conditions to be addressed/monitored: Dementia; Memory Deficits  Care Plan : General Social Work (Adult)  Updates made by  Vern Claude, LCSW since 07/23/2020 12:00 AM    Problem: Caregiver Stress     Long-Range Goal: Caregiver Coping Optimized   Start Date: 06/15/2020  Expected End Date: 10/12/2020  Recent Progress: On track  Priority: Medium  Note:   Current Barriers:  . Level of care concerns, Memory Deficits, Inability to perform IADL's independently, and Lacks knowledge of community resource: related to appropriate Day Programs . Unable to perform IADLs independently  Clinical Social Work Clinical Goal(s):  Marland Kitchen Over the next 90 days, patient will work with SW to address concerns related to assistance with patient's socialization  Interventions: . 1:1 collaboration with Steele Sizer, MD regarding development and update of comprehensive plan of care as evidenced by provider attestation and co-signature . Inter-disciplinary care team collaboration (see longitudinal plan of care) . [name] interviewed and appropriate assessments performed . [name] continues to express concerns about patient's memory challenges and her loss of appetite . [name] confirmed working with patient's provider regarding appetite concerns and has an appointment in 08/22/19 with a specialist . [name] confirmed follow up with [facility] and was directed to call back on Wednesday regarding enrollment . [name] provided with emotional support in regards to the progression of patient's condition and positive reinforcement for motivation to provide care . Continued to assess for additional community resource needs and reinforced need to call the Day Program on 07/25/20 . Discussed plans with patient for ongoing care management follow up and provided patient with direct contact information for care management team  Patient Goals/Self-Care Activities Over the next 90 days, patient will:   - Patient will attend all scheduled provider appointments - begin a notebook of services  in my neighborhood or community - follow-up on any referrals for help I am given, specifically [facility] - think ahead to make sure my need does not become an emergency - make a list of family or friends that I can call    Follow up Plan: SW will follow up with patient by phone over the next 14 business day s          Follow Up Plan: SW will follow up with patient by phone over the next 14 business  days      [employer], [place] Worker  [facility] [phone]

## 2020-07-24 ENCOUNTER — Other Ambulatory Visit: Payer: Self-pay

## 2020-07-24 ENCOUNTER — Encounter: Payer: Self-pay | Admitting: Family Medicine

## 2020-07-24 ENCOUNTER — Ambulatory Visit (INDEPENDENT_AMBULATORY_CARE_PROVIDER_SITE_OTHER): Payer: HMO | Admitting: Family Medicine

## 2020-07-24 VITALS — BP 134/70 | HR 97 | Temp 98.0°F | Resp 16 | Ht 60.0 in | Wt 179.0 lb

## 2020-07-24 DIAGNOSIS — E1122 Type 2 diabetes mellitus with diabetic chronic kidney disease: Secondary | ICD-10-CM

## 2020-07-24 DIAGNOSIS — M170 Bilateral primary osteoarthritis of knee: Secondary | ICD-10-CM | POA: Diagnosis not present

## 2020-07-24 DIAGNOSIS — R413 Other amnesia: Secondary | ICD-10-CM

## 2020-07-24 DIAGNOSIS — R7989 Other specified abnormal findings of blood chemistry: Secondary | ICD-10-CM | POA: Diagnosis not present

## 2020-07-24 DIAGNOSIS — E440 Moderate protein-calorie malnutrition: Secondary | ICD-10-CM | POA: Diagnosis not present

## 2020-07-24 DIAGNOSIS — E559 Vitamin D deficiency, unspecified: Secondary | ICD-10-CM

## 2020-07-24 DIAGNOSIS — J302 Other seasonal allergic rhinitis: Secondary | ICD-10-CM

## 2020-07-24 DIAGNOSIS — I10 Essential (primary) hypertension: Secondary | ICD-10-CM

## 2020-07-24 DIAGNOSIS — R058 Other specified cough: Secondary | ICD-10-CM

## 2020-07-24 DIAGNOSIS — E785 Hyperlipidemia, unspecified: Secondary | ICD-10-CM

## 2020-07-24 DIAGNOSIS — R432 Parageusia: Secondary | ICD-10-CM | POA: Diagnosis not present

## 2020-07-24 DIAGNOSIS — N182 Chronic kidney disease, stage 2 (mild): Secondary | ICD-10-CM | POA: Diagnosis not present

## 2020-07-24 LAB — POCT GLYCOSYLATED HEMOGLOBIN (HGB A1C): Hemoglobin A1C: 5.6 % (ref 4.0–5.6)

## 2020-07-24 MED ORDER — BENZONATATE 100 MG PO CAPS: 100.0000 mg | ORAL_CAPSULE | Freq: Two times a day (BID) | ORAL | 0 refills | Status: DC | PRN

## 2020-07-24 MED ORDER — CETIRIZINE HCL 10 MG PO TABS: 10.0000 mg | ORAL_TABLET | Freq: Every day | ORAL | 1 refills | Status: DC | PRN

## 2020-07-24 NOTE — Patient Instructions (Addendum)

## 2020-07-25 LAB — CBC WITH DIFFERENTIAL/PLATELET
Absolute Monocytes: 407 cells/uL (ref 200–950)
Basophils Absolute: 43 cells/uL (ref 0–200)
Basophils Relative: 0.4 %
Eosinophils Absolute: 75 cells/uL (ref 15–500)
Eosinophils Relative: 0.7 %
HCT: 36.4 % (ref 35.0–45.0)
Hemoglobin: 11.5 g/dL — ABNORMAL LOW (ref 11.7–15.5)
Lymphs Abs: 1380 cells/uL (ref 850–3900)
MCH: 27.1 pg (ref 27.0–33.0)
MCHC: 31.6 g/dL — ABNORMAL LOW (ref 32.0–36.0)
MCV: 85.8 fL (ref 80.0–100.0)
MPV: 10.7 fL (ref 7.5–12.5)
Monocytes Relative: 3.8 %
Neutro Abs: 8795 cells/uL — ABNORMAL HIGH (ref 1500–7800)
Neutrophils Relative %: 82.2 %
Platelets: 487 10*3/uL — ABNORMAL HIGH (ref 140–400)
RBC: 4.24 10*6/uL (ref 3.80–5.10)
RDW: 14.9 % (ref 11.0–15.0)
Total Lymphocyte: 12.9 %
WBC: 10.7 10*3/uL (ref 3.8–10.8)

## 2020-07-25 LAB — LIPID PANEL
Cholesterol: 111 mg/dL (ref <200)
HDL: 50 mg/dL (ref >=50)
LDL Cholesterol (Calc): 44 mg/dL (calc)
Non-HDL Cholesterol (Calc): 61 mg/dL (calc) (ref <130)
Total CHOL/HDL Ratio: 2.2 (calc) (ref <5.0)
Triglycerides: 86 mg/dL (ref <150)

## 2020-07-25 LAB — COMPLETE METABOLIC PANEL WITH GFR
AG Ratio: 1.4 (calc) (ref 1.0–2.5)
ALT: 12 U/L (ref 6–29)
AST: 16 U/L (ref 10–35)
Albumin: 4.4 g/dL (ref 3.6–5.1)
Alkaline phosphatase (APISO): 70 U/L (ref 37–153)
BUN/Creatinine Ratio: 9 (calc) (ref 6–22)
BUN: 15 mg/dL (ref 7–25)
CO2: 30 mmol/L (ref 20–32)
Calcium: 10.6 mg/dL — ABNORMAL HIGH (ref 8.6–10.4)
Chloride: 98 mmol/L (ref 98–110)
Creat: 1.59 mg/dL — ABNORMAL HIGH (ref 0.60–0.93)
GFR, Est African American: 38 mL/min/{1.73_m2} — ABNORMAL LOW (ref >=60)
GFR, Est Non African American: 33 mL/min/{1.73_m2} — ABNORMAL LOW (ref >=60)
Globulin: 3.1 g/dL (calc) (ref 1.9–3.7)
Glucose, Bld: 91 mg/dL (ref 65–99)
Potassium: 3.9 mmol/L (ref 3.5–5.3)
Sodium: 137 mmol/L (ref 135–146)
Total Bilirubin: 0.6 mg/dL (ref 0.2–1.2)
Total Protein: 7.5 g/dL (ref 6.1–8.1)

## 2020-07-25 LAB — TSH: TSH: 0.23 mIU/L — ABNORMAL LOW (ref 0.40–4.50)

## 2020-07-25 LAB — VITAMIN D 25 HYDROXY (VIT D DEFICIENCY, FRACTURES): Vit D, 25-Hydroxy: 85 ng/mL (ref 30–100)

## 2020-07-26 ENCOUNTER — Other Ambulatory Visit: Payer: Self-pay

## 2020-07-26 DIAGNOSIS — N289 Disorder of kidney and ureter, unspecified: Secondary | ICD-10-CM

## 2020-07-26 DIAGNOSIS — R7989 Other specified abnormal findings of blood chemistry: Secondary | ICD-10-CM

## 2020-07-30 ENCOUNTER — Telehealth: Payer: Self-pay

## 2020-07-30 ENCOUNTER — Ambulatory Visit: Payer: HMO | Admitting: *Deleted

## 2020-07-30 ENCOUNTER — Telehealth: Payer: Self-pay | Admitting: *Deleted

## 2020-07-30 DIAGNOSIS — F418 Other specified anxiety disorders: Secondary | ICD-10-CM | POA: Diagnosis not present

## 2020-07-30 DIAGNOSIS — R413 Other amnesia: Secondary | ICD-10-CM

## 2020-07-30 DIAGNOSIS — E1122 Type 2 diabetes mellitus with diabetic chronic kidney disease: Secondary | ICD-10-CM | POA: Diagnosis not present

## 2020-07-30 DIAGNOSIS — N182 Chronic kidney disease, stage 2 (mild): Secondary | ICD-10-CM | POA: Diagnosis not present

## 2020-07-30 NOTE — Patient Instructions (Signed)
Visit Information  Goals Addressed            This Visit's Progress   . Find Help in My Community       Timeframe:  Long-Range Goal Priority:  Medium Start Date:    06/15/20                         Expected End Date:    11/23/20                   Follow Up Date 08/10/20   - begin a notebook of services in my neighborhood or community - continue to follow-up on any referrals for help I am given- I.e. [facility]-given direction to call back to check on status of referral  - think ahead to make sure my need does not become an emergency - make a list of family or friends that I can call  - continue daily walks-15 minutes per day    Why is this important?    Knowing how and where to find help for yourself or family in your neighborhood and community is an important skill.   You will want to take some steps to learn how.    Notes:        The patient verbalized understanding of instructions, educational materials, and care plan provided today and declined offer to receive copy of patient instructions, educational materials, and care plan.   Telephone follow up appointment with care management team member scheduled for:08/10/20  Elliot Gurney, [place] Worker  [facility] [phone]

## 2020-07-30 NOTE — Telephone Encounter (Signed)
  Chronic Care Management   Outreach Note  07/30/2020 Name: Yvonne Curtis MRN: [id number] DOB: [birth year]  Referred by: Steele Sizer, MD Reason for referral : No chief complaint on file.   An unsuccessful telephone outreach was attempted today. The patient was referred to the case management team for assistance with care management and care coordination.   Follow Up Plan: Telephone follow up appointment with care management team member scheduled for: 07/10/20  Elliot Gurney, [place] Worker  [facility] [phone]

## 2020-07-30 NOTE — Chronic Care Management (AMB) (Signed)
Chronic Care Management    Clinical Social Work Note  07/30/2020 Name: Yvonne Curtis MRN: [id number] DOB: Dec 23, 1950  Yvonne Curtis is a 70 y.o. year old female who is a primary care patient of Steele Sizer, MD. The CCM team was consulted to assist the patient with chronic disease management and/or care coordination needs related to: [employer] .   Engaged with patient by telephone for follow up visit in response to provider referral for social work chronic care management and care coordination services.   Consent to Services:  The patient was given information about Chronic Care Management services, agreed to services, and gave verbal consent prior to initiation of services.  Please see initial visit note for detailed documentation.   Patient agreed to services and consent obtained.   Assessment: Review of patient past medical history, allergies, medications, and health status, including review of relevant consultants reports was performed today as part of a comprehensive evaluation and provision of chronic care management and care coordination services.     SDOH (Social Determinants of Health) assessments and interventions performed:    Advanced Directives Status: Not addressed in this encounter.  CCM Care Plan  Allergies  Allergen Reactions  . Oxycodone Other (See Comments)  . Oxycodone Hcl     insomnia, agitation    Outpatient Encounter Medications as of 07/30/2020  Medication Sig  . acetaminophen (TYLENOL) 500 MG tablet Take by mouth.  Marland Kitchen amLODipine (NORVASC) 10 MG tablet Take 1 tablet (10 mg total) by mouth daily.  Marland Kitchen atorvastatin (LIPITOR) 40 MG tablet Take 1 tablet (40 mg total) by mouth daily.  . benzonatate (TESSALON) 100 MG capsule Take 1-2 capsules (100-200 mg total) by mouth 2 (two) times daily as needed.  . blood glucose meter kit and supplies Dispense based on patient and insurance preference. Use once daily as directed. (FOR ICD-10 E10.9,  E11.9). STrips 100 Lancets 100  . cetirizine (ZYRTEC) 10 MG tablet Take 1 tablet (10 mg total) by mouth daily as needed.  . Cholecalciferol (VITAMIN D3) 2000 UNITS capsule VITAMIN D3, 2000UNIT (Oral Capsule)  2 po qday for 0 days  Quantity: 60.00;  Refills: 0   Ordered :15-May-2010  Steele Sizer MD;  Buddy Duty 05-October-2008 Active  . citalopram (CELEXA) 10 MG tablet Take 1 tablet (10 mg total) by mouth daily.  . Lancets (ONETOUCH DELICA PLUS [id number]) MISC Use as directed to check blood sugar  . memantine (NAMENDA) 5 MG tablet Take 5 mg by mouth 2 (two) times daily.  . metFORMIN (GLUCOPHAGE) 500 MG tablet Take 1 tablet (500 mg total) by mouth every evening.  . mirtazapine (REMERON) 7.5 MG tablet Take 1 tablet by mouth at bedtime.  Glory Rosebush ULTRA test strip 1 each daily. Use as directed to check blood sugar  . telmisartan-hydrochlorothiazide (MICARDIS HCT) 80-25 MG tablet Take 1 tablet by mouth daily.   No facility-administered encounter medications on file as of 07/30/2020.    Patient Active Problem List   Diagnosis Date Noted  . History of colonic polyps   . Polyp of transverse colon   . Abnormal sense of taste 12/19/2019  . Loss of memory 12/19/2019  . Mood changes 12/19/2019  . Acanthosis nigricans 11/14/2014  . Benign hypertension 11/14/2014  . Dyslipidemia 11/14/2014  . Gastro-esophageal reflux disease without esophagitis 11/14/2014  . Extreme obesity 11/14/2014  . Primary osteoarthritis of both knees 11/14/2014  . Microalbuminuria 10/15/2012  . Controlled type 2 diabetes mellitus with microalbuminuria ([place]) 06/05/2009  . Body dermatophytosis  12/13/2008    Conditions to be addressed/monitored:  Memory Deficits  Care Plan : General Social Work (Adult)  Updates made by Vern Claude, LCSW since 07/30/2020 12:00 AM    Problem: Caregiver Stress     Long-Range Goal: Caregiver Coping Optimized   Start Date: 06/15/2020  Expected End Date: 10/12/2020  Recent Progress:  On track  Priority: Medium  Note:   Current Barriers:  . Level of care concerns, Memory Deficits, Inability to perform IADL's independently, and Lacks knowledge of community resource: related to appropriate Day Programs . Unable to perform IADLs independently  Clinical Social Work Clinical Goal(s):  Marland Kitchen Over the next 90 days, patient will work with SW to address concerns related to assistance with patient's socialization  Interventions: . 1:1 collaboration with Steele Sizer, MD regarding development and update of comprehensive plan of care as evidenced by provider attestation and co-signature . Inter-disciplinary care team collaboration (see longitudinal plan of care) . [name] interviewed and appropriate assessments performed . [name] continues to express concerns about patient's memory challenges her appetite is slowly improvement . [name] confirmed continued follow up with specialist . [name] confirmed follow up with [facility] and was directed to call back every couples of days to check status on enrollment . [name] provided with emotional support in regards to the progression of patient's condition and positive reinforcement for motivation to provide care . Continued to assess for additional community resource needs and reinforced need to call the Day Program every couple of days to check status . Discussed plans with patient for ongoing care management follow up and provided patient with direct contact information for care management team  Patient Goals/Self-Care Activities Over the next 90 days, patient will:   - Patient will attend all scheduled provider appointments - begin a notebook of services in my neighborhood or community - follow-up on any referrals for help I am given, specifically [facility] - think ahead to make sure my need does not become an emergency - make a list of family or friends  that I can call    Follow up Plan: SW will follow up with patient by phone over the next 14 business day s          Follow Up Plan: Appointment scheduled for SW follow up with client by phone on: 08/10/20      Elliot Gurney, [place] Worker  [facility] [phone]

## 2020-08-02 ENCOUNTER — Other Ambulatory Visit: Payer: Self-pay | Admitting: Nephrology

## 2020-08-02 DIAGNOSIS — R809 Proteinuria, unspecified: Secondary | ICD-10-CM | POA: Diagnosis not present

## 2020-08-02 DIAGNOSIS — I10 Essential (primary) hypertension: Secondary | ICD-10-CM | POA: Diagnosis not present

## 2020-08-02 DIAGNOSIS — E785 Hyperlipidemia, unspecified: Secondary | ICD-10-CM | POA: Diagnosis not present

## 2020-08-02 DIAGNOSIS — E1122 Type 2 diabetes mellitus with diabetic chronic kidney disease: Secondary | ICD-10-CM | POA: Diagnosis not present

## 2020-08-02 DIAGNOSIS — D631 Anemia in chronic kidney disease: Secondary | ICD-10-CM | POA: Diagnosis not present

## 2020-08-02 DIAGNOSIS — D638 Anemia in other chronic diseases classified elsewhere: Secondary | ICD-10-CM

## 2020-08-02 DIAGNOSIS — N1832 Chronic kidney disease, stage 3b: Secondary | ICD-10-CM | POA: Diagnosis not present

## 2020-08-06 ENCOUNTER — Other Ambulatory Visit: Payer: Self-pay

## 2020-08-06 DIAGNOSIS — F418 Other specified anxiety disorders: Secondary | ICD-10-CM

## 2020-08-06 MED ORDER — CITALOPRAM HYDROBROMIDE 10 MG PO TABS: 10.0000 mg | ORAL_TABLET | Freq: Every day | ORAL | 0 refills | Status: DC

## 2020-08-08 ENCOUNTER — Telehealth: Payer: Self-pay

## 2020-08-10 ENCOUNTER — Telehealth: Payer: Self-pay

## 2020-08-10 ENCOUNTER — Ambulatory Visit (INDEPENDENT_AMBULATORY_CARE_PROVIDER_SITE_OTHER): Payer: HMO | Admitting: *Deleted

## 2020-08-10 DIAGNOSIS — R413 Other amnesia: Secondary | ICD-10-CM

## 2020-08-10 DIAGNOSIS — N182 Chronic kidney disease, stage 2 (mild): Secondary | ICD-10-CM

## 2020-08-10 DIAGNOSIS — E1122 Type 2 diabetes mellitus with diabetic chronic kidney disease: Secondary | ICD-10-CM

## 2020-08-10 DIAGNOSIS — F418 Other specified anxiety disorders: Secondary | ICD-10-CM

## 2020-08-10 NOTE — Chronic Care Management (AMB) (Signed)
Chronic Care Management    Clinical Social Work Note  08/10/2020 Name: ALEJANDRINA RAIMER MRN: [id number] DOB: [birth year]  LAURENCE FOLZ is a 70 y.o. year old female who is a primary care patient of Steele Sizer, MD. The CCM team was consulted to assist the patient with chronic disease management and/or care coordination needs related to: [employer] .   Engaged with patient by telephone for follow up visit in response to provider referral for social work chronic care management and care coordination services.   Consent to Services:  The patient was given information about Chronic Care Management services, agreed to services, and gave verbal consent prior to initiation of services.  Please see initial visit note for detailed documentation.   Patient agreed to services and consent obtained.   Assessment: Review of patient past medical history, allergies, medications, and health status, including review of relevant consultants reports was performed today as part of a comprehensive evaluation and provision of chronic care management and care coordination services.     SDOH (Social Determinants of Health) assessments and interventions performed:    Advanced Directives Status: Not addressed in this encounter.  CCM Care Plan  Allergies  Allergen Reactions  . Oxycodone Other (See Comments)  . Oxycodone Hcl     insomnia, agitation    Outpatient Encounter Medications as of 08/10/2020  Medication Sig  . acetaminophen (TYLENOL) 500 MG tablet Take by mouth.  Marland Kitchen amLODipine (NORVASC) 10 MG tablet Take 1 tablet (10 mg total) by mouth daily.  Marland Kitchen atorvastatin (LIPITOR) 40 MG tablet Take 1 tablet (40 mg total) by mouth daily.  . benzonatate (TESSALON) 100 MG capsule Take 1-2 capsules (100-200 mg total) by mouth 2 (two) times daily as needed.  . blood glucose meter kit and supplies Dispense based on patient and insurance preference. Use once daily as directed. (FOR ICD-10 E10.9,  E11.9). STrips 100 Lancets 100  . cetirizine (ZYRTEC) 10 MG tablet Take 1 tablet (10 mg total) by mouth daily as needed.  . Cholecalciferol (VITAMIN D3) 2000 UNITS capsule VITAMIN D3, 2000UNIT (Oral Capsule)  2 po qday for 0 days  Quantity: 60.00;  Refills: 0   Ordered :15-May-2010  Steele Sizer MD;  Buddy Duty 05-October-2008 Active  . citalopram (CELEXA) 10 MG tablet Take 1 tablet (10 mg total) by mouth daily.  . Lancets (ONETOUCH DELICA PLUS [id number]) MISC Use as directed to check blood sugar  . memantine (NAMENDA) 5 MG tablet Take 5 mg by mouth 2 (two) times daily.  . metFORMIN (GLUCOPHAGE) 500 MG tablet Take 1 tablet (500 mg total) by mouth every evening.  . mirtazapine (REMERON) 7.5 MG tablet Take 1 tablet by mouth at bedtime.  Glory Rosebush ULTRA test strip 1 each daily. Use as directed to check blood sugar  . telmisartan-hydrochlorothiazide (MICARDIS HCT) 80-25 MG tablet Take 1 tablet by mouth daily.   No facility-administered encounter medications on file as of 08/10/2020.    Patient Active Problem List   Diagnosis Date Noted  . History of colonic polyps   . Polyp of transverse colon   . Abnormal sense of taste 12/19/2019  . Loss of memory 12/19/2019  . Mood changes 12/19/2019  . Acanthosis nigricans 11/14/2014  . Benign hypertension 11/14/2014  . Dyslipidemia 11/14/2014  . Gastro-esophageal reflux disease without esophagitis 11/14/2014  . Extreme obesity 11/14/2014  . Primary osteoarthritis of both knees 11/14/2014  . Microalbuminuria 10/15/2012  . Controlled type 2 diabetes mellitus with microalbuminuria ([place]) 06/05/2009  . Body dermatophytosis  12/13/2008    Conditions to be addressed/monitored:  Memory Deficits  Care Plan : General Social Work (Adult)  Updates made by Vern Claude, LCSW since 08/10/2020 12:00 AM    Problem: Caregiver Stress     Long-Range Goal: Caregiver Coping Optimized   Start Date: 06/15/2020  Expected End Date: 10/12/2020  Recent Progress: On  track  Priority: Medium  Note:   Current Barriers:  . Level of care concerns, Memory Deficits, Inability to perform IADL's independently, and Lacks knowledge of community resource: related to appropriate Day Programs . Unable to perform IADLs independently  Clinical Social Work Clinical Goal(s):  Marland Kitchen Over the next 90 days, patient will work with SW to address concerns related to assistance with patient's socialization  Interventions: . 1:1 collaboration with Steele Sizer, MD regarding development and update of comprehensive plan of care as evidenced by provider attestation and co-signature . Inter-disciplinary care team collaboration (see longitudinal plan of care) . [name] interviewed and appropriate assessments performed . [name] continues to express concerns about patient's memory challenges her appetite is slowly improvement . [name] confirmed continued follow up with specialist  . [name] confirmed follow up with [facility] and was informed that they are bringing in the existing residents and will begin processing new referrals next week . [name] provided with emotional support in regards to the progression of patient's condition and positive reinforcement for motivation to provide care . Continued to assess for additional community resource needs and reinforced need to call the Day Program every couple of days to check status . Discussed plans with patient for ongoing care management follow up and provided patient with direct contact information for care management team  Patient Goals/Self-Care Activities Over the next 90 days, patient will:   - Patient will attend all scheduled provider appointments - begin a notebook of services in my neighborhood or community - follow-up on any referrals for help I am given, specifically [facility] - think ahead to make sure my need does not become an emergency -  make a list of family or friends that I can call    Follow up Plan: SW will follow up with patient by phone over the next 14 business day s          Follow Up Plan:  Appointment scheduled for SW follow up with client by phone on: 08/31/20      Elliot Gurney, [place] Worker  [facility] [phone]

## 2020-08-10 NOTE — Patient Instructions (Signed)
Visit Information  Goals Addressed            This Visit's Progress   . Find Help in My Community       Timeframe:  Long-Range Goal Priority:  Medium Start Date:    06/15/20                         Expected End Date:    11/23/20                   Follow Up Date 08/31/20   - begin a notebook of services in my neighborhood or community - continue to follow-up on any referrals for help I am given- I.e. [facility]-given direction to call back to check on status of referral  - think ahead to make sure my need does not become an emergency - make a list of family or friends that I can call  - continue daily walks-15 minutes per day    Why is this important?    Knowing how and where to find help for yourself or family in your neighborhood and community is an important skill.   You will want to take some steps to learn how.    Notes:        The patient verbalized understanding of instructions, educational materials, and care plan provided today and declined offer to receive copy of patient instructions, educational materials, and care plan.   Telephone follow up appointment with care management team member scheduled for:08/31/20  Elliot Gurney, [place] Worker  [facility] [phone]

## 2020-08-20 NOTE — Progress Notes (Signed)
Primary Care Physician: Alba Cory, MD  Primary Gastroenterologist:  Dr. Midge Minium  Chief Complaint  Patient presents with  . New Patient (Initial Visit)    dysphagia    HPI: Yvonne Curtis is a 70 y.o. female here for dysphagia.  The patient was seen by ENT for a loss of taste and some weight loss coming from her dysphagia.  It was recommended that the patient may be having some of her problems caused by her dementia.  It was also suggested that she may need an EGD due to her being a poor historian to rule out any obstructive lesion as the cause of her dysphagia and weight loss. The patient had previously seen me for a colonoscopy with a few small polyps removed at that time. That was in September 2021 and the polyps were found to be adenomatous. She has lost 13 lbs in a month.  The patient reports that she eats a slice of toast and does not put anything on it and that helps her symptoms somewhat.  She now reports that she has been stable with her weight but her husband states that she still does not eat enough.  Past Medical History:  Diagnosis Date  . Arthritis    knees  . Cataract 11/25/2017   Nuclear - OU  . Diabetes mellitus without complication (HCC)   . Hyperlipidemia   . Hyperopia, bilateral 11/25/2017   [place] [facility] [place]  . Hypertension   . Obesity   . Patient is Jehovah's Witness    declines blood products  . Wears dentures    full upper    Current Outpatient Medications  Medication Sig Dispense Refill  . acetaminophen (TYLENOL) 500 MG tablet Take by mouth.    Marland Kitchen amLODipine (NORVASC) 10 MG tablet Take 1 tablet (10 mg total) by mouth daily. 90 tablet 1  . atorvastatin (LIPITOR) 40 MG tablet Take 1 tablet (40 mg total) by mouth daily. 90 tablet 1  . benzonatate (TESSALON) 100 MG capsule Take 1-2 capsules (100-200 mg total) by mouth 2 (two) times daily as needed. 40 capsule 0  . blood glucose meter kit and supplies Dispense based on patient and insurance  preference. Use once daily as directed. (FOR ICD-10 E10.9, E11.9). STrips 100 Lancets 100 1 each 0  . cetirizine (ZYRTEC) 10 MG tablet Take 1 tablet (10 mg total) by mouth daily as needed. 90 tablet 1  . Cholecalciferol (VITAMIN D3) 2000 UNITS capsule VITAMIN D3, 2000UNIT (Oral Capsule)  2 po qday for 0 days  Quantity: 60.00;  Refills: 0   Ordered :15-May-2010  Alba Cory MD;  Mora Appl 05-October-2008 Active    . citalopram (CELEXA) 10 MG tablet Take 1 tablet (10 mg total) by mouth daily. 90 tablet 0  . Lancets (ONETOUCH DELICA PLUS [id number]) MISC Use as directed to check blood sugar    . memantine (NAMENDA) 5 MG tablet Take 5 mg by mouth 2 (two) times daily.    . metFORMIN (GLUCOPHAGE) 500 MG tablet Take 1 tablet (500 mg total) by mouth every evening. 90 tablet 1  . mirtazapine (REMERON) 7.5 MG tablet Take 1 tablet by mouth at bedtime.    Letta Pate ULTRA test strip 1 each daily. Use as directed to check blood sugar    . telmisartan-hydrochlorothiazide (MICARDIS HCT) 80-25 MG tablet Take 1 tablet by mouth daily. 90 tablet 1   No current facility-administered medications for this visit.    Allergies as of 08/21/2020 - Review Complete  08/21/2020  Allergen Reaction Noted  . Oxycodone Other (See Comments) 11/17/2014  . Oxycodone hcl  11/14/2014    ROS:  General: Negative for anorexia, weight loss, fever, chills, fatigue, weakness. ENT: Negative for hoarseness, difficulty swallowing , nasal congestion. CV: Negative for chest pain, angina, palpitations, dyspnea on exertion, peripheral edema.  Respiratory: Negative for dyspnea at rest, dyspnea on exertion, cough, sputum, wheezing.  GI: See history of present illness. GU:  Negative for dysuria, hematuria, urinary incontinence, urinary frequency, nocturnal urination.  Endo: Negative for unusual weight change.    Physical Examination:   BP 106/72   Pulse 87   Ht 5' (1.524 m)   Wt 179 lb 6.4 oz (81.4 kg)   BMI 35.04 kg/m    General: Well-nourished, well-developed in no acute distress.  Eyes: No icterus. Conjunctivae pink. Lungs: Clear to auscultation bilaterally. Non-labored. Heart: Regular rate and rhythm, no murmurs rubs or gallops.  Abdomen: Bowel sounds are normal, nontender, nondistended, no hepatosplenomegaly or masses, no abdominal bruits or hernia , no rebound or guarding.   Extremities: No lower extremity edema. No clubbing or deformities. Neuro: Alert and oriented x 3.  Grossly intact. Skin: Warm and dry, no jaundice.   Psych: Alert and cooperative, normal mood and affect.  Labs:    Imaging Studies: No results found.  Assessment and Plan:   Yvonne Curtis is a 70 y.o. y/o female who comes in today with a history of having a salty taste in her mouth and was evaluated by ENT.  The patient was then sent to see me for possible dysphagia and possible reflux causing her symptoms.  The patient's bad taste in her mouth may be caused by bile reflux.  Due to the patient's weight loss and dysphagia she will be set up for an EGD.  The patient's last colonoscopy was in September 2021 and that she is not due for another colonoscopy.  The patient and her husband have been explained the plan and agree with it.     Lucilla Lame, MD. Marval Regal    Note: This dictation was prepared with Dragon dictation along with smaller phrase technology. Any transcriptional errors that result from this process are unintentional.

## 2020-08-21 ENCOUNTER — Encounter: Payer: Self-pay | Admitting: Gastroenterology

## 2020-08-21 ENCOUNTER — Other Ambulatory Visit: Payer: Self-pay

## 2020-08-21 ENCOUNTER — Ambulatory Visit: Payer: HMO | Admitting: Gastroenterology

## 2020-08-21 VITALS — BP 106/72 | HR 87 | Ht 60.0 in | Wt 179.4 lb

## 2020-08-21 DIAGNOSIS — R1310 Dysphagia, unspecified: Secondary | ICD-10-CM

## 2020-08-23 ENCOUNTER — Other Ambulatory Visit: Payer: Self-pay

## 2020-08-23 ENCOUNTER — Encounter
Admission: RE | Disposition: A | Discharge status: Home / Self Care | Payer: Self-pay | Source: Home / Self Care | Attending: Gastroenterology

## 2020-08-23 ENCOUNTER — Ambulatory Visit
Admission: RE | Admit: 2020-08-23 | Discharge: 2020-08-23 | Disposition: A | Discharge status: Home / Self Care | Payer: HMO | Attending: Gastroenterology | Admitting: Gastroenterology

## 2020-08-23 ENCOUNTER — Ambulatory Visit: Payer: HMO | Admitting: Anesthesiology

## 2020-08-23 ENCOUNTER — Encounter: Payer: Self-pay | Admitting: Gastroenterology

## 2020-08-23 ENCOUNTER — Ambulatory Visit: Payer: Self-pay

## 2020-08-23 DIAGNOSIS — Z801 Family history of malignant neoplasm of trachea, bronchus and lung: Secondary | ICD-10-CM | POA: Diagnosis not present

## 2020-08-23 DIAGNOSIS — Z885 Allergy status to narcotic agent status: Secondary | ICD-10-CM | POA: Diagnosis not present

## 2020-08-23 DIAGNOSIS — Z6829 Body mass index (BMI) 29.0-29.9, adult: Secondary | ICD-10-CM | POA: Diagnosis not present

## 2020-08-23 DIAGNOSIS — E785 Hyperlipidemia, unspecified: Secondary | ICD-10-CM | POA: Diagnosis not present

## 2020-08-23 DIAGNOSIS — Z79899 Other long term (current) drug therapy: Secondary | ICD-10-CM | POA: Insufficient documentation

## 2020-08-23 DIAGNOSIS — E1122 Type 2 diabetes mellitus with diabetic chronic kidney disease: Secondary | ICD-10-CM

## 2020-08-23 DIAGNOSIS — F09 Unspecified mental disorder due to known physiological condition: Secondary | ICD-10-CM

## 2020-08-23 DIAGNOSIS — Z7984 Long term (current) use of oral hypoglycemic drugs: Secondary | ICD-10-CM | POA: Insufficient documentation

## 2020-08-23 DIAGNOSIS — Z8249 Family history of ischemic heart disease and other diseases of the circulatory system: Secondary | ICD-10-CM | POA: Diagnosis not present

## 2020-08-23 DIAGNOSIS — E1136 Type 2 diabetes mellitus with diabetic cataract: Secondary | ICD-10-CM | POA: Insufficient documentation

## 2020-08-23 DIAGNOSIS — R1310 Dysphagia, unspecified: Secondary | ICD-10-CM | POA: Insufficient documentation

## 2020-08-23 DIAGNOSIS — Z800 Family history of malignant neoplasm of digestive organs: Secondary | ICD-10-CM | POA: Diagnosis not present

## 2020-08-23 DIAGNOSIS — K219 Gastro-esophageal reflux disease without esophagitis: Secondary | ICD-10-CM | POA: Diagnosis not present

## 2020-08-23 DIAGNOSIS — N182 Chronic kidney disease, stage 2 (mild): Secondary | ICD-10-CM | POA: Diagnosis not present

## 2020-08-23 DIAGNOSIS — E669 Obesity, unspecified: Secondary | ICD-10-CM | POA: Insufficient documentation

## 2020-08-23 DIAGNOSIS — I10 Essential (primary) hypertension: Secondary | ICD-10-CM | POA: Diagnosis not present

## 2020-08-23 DIAGNOSIS — Z9049 Acquired absence of other specified parts of digestive tract: Secondary | ICD-10-CM | POA: Diagnosis not present

## 2020-08-23 DIAGNOSIS — K449 Diaphragmatic hernia without obstruction or gangrene: Secondary | ICD-10-CM | POA: Diagnosis not present

## 2020-08-23 DIAGNOSIS — F418 Other specified anxiety disorders: Secondary | ICD-10-CM

## 2020-08-23 DIAGNOSIS — E119 Type 2 diabetes mellitus without complications: Secondary | ICD-10-CM | POA: Diagnosis not present

## 2020-08-23 HISTORY — PX: ESOPHAGOGASTRODUODENOSCOPY (EGD) WITH PROPOFOL: SHX5813

## 2020-08-23 LAB — GLUCOSE, CAPILLARY: Glucose-Capillary: 95 mg/dL (ref 70–99)

## 2020-08-23 SURGERY — ESOPHAGOGASTRODUODENOSCOPY (EGD) WITH PROPOFOL
Anesthesia: General

## 2020-08-23 MED ORDER — PROPOFOL 500 MG/50ML IV EMUL
INTRAVENOUS | Status: DC | PRN
  Administered 2020-08-23: 145 ug/kg/min via INTRAVENOUS

## 2020-08-23 MED ORDER — SODIUM CHLORIDE 0.9 % IV SOLN: INTRAVENOUS | Status: DC

## 2020-08-23 MED ORDER — PROPOFOL 10 MG/ML IV BOLUS
INTRAVENOUS | Status: DC | PRN
  Administered 2020-08-23: 60 mg via INTRAVENOUS
  Administered 2020-08-23: 10 mg via INTRAVENOUS

## 2020-08-23 MED ORDER — GLYCOPYRROLATE 0.2 MG/ML IJ SOLN
INTRAMUSCULAR | Status: DC | PRN
  Administered 2020-08-23: .2 mg via INTRAVENOUS

## 2020-08-23 MED ORDER — PANTOPRAZOLE SODIUM 40 MG PO TBEC: 40.0000 mg | DELAYED_RELEASE_TABLET | Freq: Every day | ORAL | 11 refills | Status: DC

## 2020-08-23 NOTE — Chronic Care Management (AMB) (Signed)
Chronic Care Management   Follow Up Note   08/23/2020 Name: Yvonne Curtis MRN: [id number] DOB: 01-01-1951  Primary Care Provider: Steele Sizer, MD Reason for referral : Chronic Care Management   Yvonne Curtis is a 70 y.o. year old female who is a primary care patient of Steele Sizer, MD.   Review of Mrs. Trentman's status, including review of consultants reports, relevant labs and test results was conducted today. Collaboration with appropriate care team members was performed as part of the comprehensive evaluation and provision of chronic care management services.    SDOH (Social Determinants of Health) assessments performed: No    Outpatient Encounter Medications as of 08/23/2020  Medication Sig  . acetaminophen (TYLENOL) 500 MG tablet Take by mouth.  Marland Kitchen amLODipine (NORVASC) 10 MG tablet Take 1 tablet (10 mg total) by mouth daily.  Marland Kitchen atorvastatin (LIPITOR) 40 MG tablet Take 1 tablet (40 mg total) by mouth daily.  . benzonatate (TESSALON) 100 MG capsule Take 1-2 capsules (100-200 mg total) by mouth 2 (two) times daily as needed.  . blood glucose meter kit and supplies Dispense based on patient and insurance preference. Use once daily as directed. (FOR ICD-10 E10.9, E11.9). STrips 100 Lancets 100  . cetirizine (ZYRTEC) 10 MG tablet Take 1 tablet (10 mg total) by mouth daily as needed.  . Cholecalciferol (VITAMIN D3) 2000 UNITS capsule VITAMIN D3, 2000UNIT (Oral Capsule)  2 po qday for 0 days  Quantity: 60.00;  Refills: 0   Ordered :15-May-2010  Steele Sizer MD;  Buddy Duty 05-October-2008 Active  . citalopram (CELEXA) 10 MG tablet Take 1 tablet (10 mg total) by mouth daily.  . Lancets (ONETOUCH DELICA PLUS [id number]) MISC Use as directed to check blood sugar  . memantine (NAMENDA) 5 MG tablet Take 5 mg by mouth 2 (two) times daily.  . metFORMIN (GLUCOPHAGE) 500 MG tablet Take 1 tablet (500 mg total) by mouth every evening.  . mirtazapine (REMERON) 7.5 MG tablet Take 1  tablet by mouth at bedtime.  Glory Rosebush ULTRA test strip 1 each daily. Use as directed to check blood sugar  . pantoprazole (PROTONIX) 40 MG tablet Take 1 tablet (40 mg total) by mouth daily.  Marland Kitchen telmisartan-hydrochlorothiazide (MICARDIS HCT) 80-25 MG tablet Take 1 tablet by mouth daily.  . [DISCONTINUED] 0.9 %  sodium chloride infusion    No facility-administered encounter medications on file as of 08/23/2020.     Objective:  Patient Care Plan: Diabetes Type 2 (Adult)  Problem Identified: Disease Progression (Diabetes, Type 2)     Long-Range Goal: Disease Progression Prevented or Minimized Completed 08/23/2020  Start Date: 06/11/2020  Expected End Date: 10/09/2020  Priority: High  Note:   Objective:   Lab Results  Component Value Date   HGBA1C 5.6 07/24/2020    Current Barriers:  . Chronic disease management and support related to Diabetes self-management  Case Manager Clinical Goal(s):  Marland Kitchen Over the next 120 days, patient will demonstrate improved adherence to prescribed treatment plan for Diabetes self management as evidenced by monitoring and recording of CBG, adherence to ADA/ carb modified diet and adherence to prescribed medication regimen.  Interventions:  . Collaboration with Steele Sizer, MD regarding development and update of comprehensive plan of care as evidenced by provider attestation and co-signature . Inter-disciplinary care team collaboration (see longitudinal plan of care) . Reviewed medications and compliance with current treatment plan. Reports excellent compliance with medications. Denies incidents or falls related to hypoglycemia. Her spouse confirms that her appetite has  improved since our last outreach. She is still attempting to increase her daily intake of proteins and eating small meals as tolerated. Her spouse is very knowledgeable of appropriate interventions for hypoglycemia and hyperglycemia and continues to monitor her closely. Her A1C is currently at  goal.    Patient Goals/Self-Care Activities - Self administer oral medications as prescribed - Attend all scheduled provider appointments - Continue monitoring blood glucose levels, record readings and utilize recommended interventions - Maintain adequate hydration and increase intake of nutritional meal supplements    when unable to tolerate regular meals. - Notify provider or care management team with health related questions or new concerns   Goal Met     Patient Care Plan: Wellness (Adult)  Problem Identified: Cognitive Function (Wellness)     Long-Range Goal: Cognitive Function Enhanced   Start Date: 06/11/2020  Expected End Date: 10/09/2020  This Visit's Progress: On track  Recent Progress: On track  Priority: High  Note:   Current Barriers:  . Chronic Care Management support needs r/t cognitive function.  Clinical Goal(s):  Marland Kitchen Over the next 90 days, patient will not experience complications r/t decline in cognitive function.  Interventions:  . Evaluation of current treatment plan related to  self-management and patient's adherence to plan as established by provider. . Collaboration with Steele Sizer, MD regarding development and update of comprehensive plan of care as evidenced by provider attestation and co-signature. Bertram Savin care team collaboration (see longitudinal plan of care) . Discussed concerns r/t cognitive decline. Reports doing well over the past few weeks. She continues to perform ADL's independently. Her spouse remains available to assist with transportation and tasks in the home. Denies recent falls over decline in function. Reports following recommended safety measures. Declines current need for additional assistance.  . Reviewed pending appointments. She will complete a clinic follow-up with the Neurology team on 08/28/20.   Patient Goals/Self Care Activities:  -Continue taking medications as prescribed. -Adhere to recommended fall  prevention measures -Update the care management team if in-home assistance is needed. -Attend follow up with Neurologist Dr. Melrose Nakayama as scheduled.   Follow Up Plan:  Will follow up next month.      PLAN A member of the care management team will follow up with Mrs. Maund next month.     Cristy [facility] Care Management [facility] [phone]

## 2020-08-23 NOTE — Transfer of Care (Signed)
Immediate Anesthesia Transfer of Care Note  Patient: Yvonne Curtis  Procedure(s) Performed: ESOPHAGOGASTRODUODENOSCOPY (EGD) WITH PROPOFOL (N/A )  Patient Location: Endoscopy Unit  Anesthesia Type:General  Level of Consciousness: drowsy and patient cooperative  Airway & Oxygen Therapy: Patient Spontanous Breathing and Patient connected to face mask oxygen  Post-op Assessment: Report given to RN and Post -op Vital signs reviewed and stable  Post vital signs: Reviewed and stable  Last Vitals:  Vitals Value Taken Time  BP    Temp    Pulse    Resp    SpO2      Last Pain:  Vitals:   08/23/20 0913  TempSrc: Temporal  PainSc: 0-No pain         Complications: No complications documented.

## 2020-08-23 NOTE — H&P (Signed)
Lucilla Lame, MD [address] [place], [place] [address] Phone:[phone] Fax : [phone]  Primary Care Physician:  Steele Sizer, MD Primary Gastroenterologist:  Dr. Allen Norris  Pre-Procedure History & Physical: HPI:  Yvonne Curtis is a 70 y.o. female is here for an endoscopy.   Past Medical History:  Diagnosis Date  . Arthritis    knees  . Cataract 11/25/2017   Nuclear - OU  . Diabetes mellitus without complication ([place])   . Hyperlipidemia   . Hyperopia, bilateral 11/25/2017   [facility] [facility]. [place]  . Hypertension   . Obesity   . Patient is Jehovah's Witness    declines blood products  . Wears dentures    full upper    Past Surgical History:  Procedure Laterality Date  . CHOLECYSTECTOMY    . COLONOSCOPY WITH PROPOFOL N/A 11/30/2014   Procedure: COLONOSCOPY WITH PROPOFOL;  Surgeon: Lucilla Lame, MD;  Location: [facility];  Service: Endoscopy;  Laterality: N/A;  WITH BIOPSY-- TRANSVERSE COLON POLYP  X 2 DESCENING COLON POLYP  . COLONOSCOPY WITH PROPOFOL N/A 01/17/2020   Procedure: COLONOSCOPY WITH PROPOFOL;  Surgeon: Lucilla Lame, MD;  Location[facility] ENDOSCOPY;  Service: Endoscopy;  Laterality: N/A;  . GALLBLADDER SURGERY    . SEPTOPLASTY    . tonsillectomy    . TONSILLECTOMY    . TUBAL LIGATION      Prior to Admission medications   Medication Sig Start Date End Date Taking? Authorizing Provider  amLODipine (NORVASC) 10 MG tablet Take 1 tablet (10 mg total) by mouth daily. 06/01/20  Yes Sowles, Drue Stager, MD  atorvastatin (LIPITOR) 40 MG tablet Take 1 tablet (40 mg total) by mouth daily. 03/23/20  Yes Sowles, Drue Stager, MD  cetirizine (ZYRTEC) 10 MG tablet Take 1 tablet (10 mg total) by mouth daily as needed. 07/24/20  Yes Steele Sizer, MD  Cholecalciferol (VITAMIN D3) 2000 UNITS capsule VITAMIN D3, 2000UNIT (Oral Capsule)  2 po qday for 0 days  Quantity: 60.00;  Refills: 0   Ordered :15-May-2010  Steele Sizer MD;  Started  05-October-2008 Active 10/05/08  Yes [provider]  citalopram (CELEXA) 10 MG tablet Take 1 tablet (10 mg total) by mouth daily. 08/06/20  Yes Sowles, Drue Stager, MD  memantine (NAMENDA) 5 MG tablet Take 5 mg by mouth 2 (two) times daily. 07/18/20  Yes [provider]  metFORMIN (GLUCOPHAGE) 500 MG tablet Take 1 tablet (500 mg total) by mouth every evening. 06/01/20  Yes Sowles, Drue Stager, MD  mirtazapine (REMERON) 7.5 MG tablet Take 1 tablet by mouth at bedtime. 06/25/20 06/25/21 Yes Anabel Bene, MD  telmisartan-hydrochlorothiazide (MICARDIS HCT) 80-25 MG tablet Take 1 tablet by mouth daily. 06/01/20  Yes Sowles, Drue Stager, MD  acetaminophen (TYLENOL) 500 MG tablet Take by mouth. 10/15/12   [provider]  benzonatate (TESSALON) 100 MG capsule Take 1-2 capsules (100-200 mg total) by mouth 2 (two) times daily as needed. 07/24/20   Steele Sizer, MD  blood glucose meter kit and supplies Dispense based on patient and insurance preference. Use once daily as directed. (FOR ICD-10 E10.9, E11.9). STrips 100 Lancets 100 03/23/20   Sowles, Drue Stager, MD  Lancets (ONETOUCH DELICA PLUS [id number]) MISC Use as directed to check blood sugar 03/28/20   [provider]  [facility] ULTRA test strip 1 each daily. Use as directed to check blood sugar 03/28/20   [provider]    Allergies as of 08/21/2020 - Review Complete 08/21/2020  Allergen Reaction Noted  . Oxycodone Other (  See Comments) 11/17/2014  . Oxycodone hcl  11/14/2014    Family History  Problem Relation Age of Onset  . Colon cancer Mother   . Lung cancer Father   . Hypertension Sister   . Hypertension Sister   . Breast cancer Neg Hx     Social History   Socioeconomic History  . Marital status: Married    Spouse name: [name]   . Number of children: 2  . Years of education: Not on file  . Highest education level: 12th grade  Occupational History  . Occupation: retired  Tobacco Use  . Smoking status:  Never Smoker  . Smokeless tobacco: Never Used  Vaping Use  . Vaping Use: Never used  Substance and Sexual Activity  . Alcohol use: No    Alcohol/week: 0.0 standard drinks  . Drug use: No  . Sexual activity: Yes    Partners: Male  Other Topics Concern  . 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    She dances for physical activity   She has two grandchildren, one is in college   Social Determinants of Health   Financial Resource Strain: Low Risk   . Difficulty of Paying Living Expenses: Not hard at all  Food Insecurity: No Food Insecurity  . Worried About Charity fundraiser in the Last Year: Never true  . Ran Out of Food in the Last Year: Never true  Transportation Needs: No Transportation Needs  . Lack of Transportation (Medical): No  . Lack of Transportation (Non-Medical): No  Physical Activity: Inactive  . Days of Exercise per Week: 0 days  . Minutes of Exercise per Session: 0 min  Stress: No Stress Concern Present  . Feeling of Stress : Not at all  Social Connections: Socially Integrated  . Frequency of Communication with Friends and Family: More than three times a week  . Frequency of Social Gatherings with Friends and Family: More than three times a week  . Attends Religious Services: More than 4 times per year  . Active Member of Clubs or Organizations: Yes  . Attends Archivist Meetings: More than 4 times per year  . Marital Status: Married  Human resources officer Violence: Not At Risk  . Fear of Current or Ex-Partner: No  . Emotionally Abused: No  . Physically Abused: No  . Sexually Abused: No    Review of Systems: See HPI, otherwise negative ROS  Physical Exam: There were no vitals taken for this visit. General:   Alert,  pleasant and cooperative in NAD Head:  Normocephalic and atraumatic. Neck:  Supple; no masses or thyromegaly. Lungs:  Clear throughout to  auscultation.    Heart:  Regular rate and rhythm. Abdomen:  Soft, nontender and nondistended. Normal bowel sounds, without guarding, and without rebound.   Neurologic:  Alert and  oriented x4;  grossly normal neurologically.  Impression/Plan: ERICCA LABRA is here for an endoscopy to be performed for dysphagia and reflux  Risks, benefits, limitations, and alternatives regarding  endoscopy have been reviewed with the patient.  Questions have been answered.  All parties agreeable.   Lucilla Lame, MD  08/23/2020, 9:13 AM

## 2020-08-23 NOTE — Anesthesia Preprocedure Evaluation (Signed)
Anesthesia Evaluation  Patient identified by MRN, date of birth, ID band Patient awake    Reviewed: Allergy & Precautions, NPO status , Patient's Chart, lab work & pertinent test results  History of Anesthesia Complications Negative for: history of anesthetic complications  Airway Mallampati: III       Dental  (+) Upper Dentures   Pulmonary neg sleep apnea, neg COPD, Not current smoker,           Cardiovascular hypertension, Pt. on medications (-) Past MI and (-) CHF (-) dysrhythmias (-) Valvular Problems/Murmurs     Neuro/Psych neg Seizures Dementia    GI/Hepatic Neg liver ROS, GERD  Medicated and Controlled,  Endo/Other  diabetes, Type 2, Oral Hypoglycemic Agents  Renal/GU negative Renal ROS     Musculoskeletal   Abdominal   Peds  Hematology   Anesthesia Other Findings   Reproductive/Obstetrics                             Anesthesia Physical Anesthesia Plan  ASA: III  Anesthesia Plan: General   Post-op Pain Management:    Induction: Intravenous  PONV Risk Score and Plan: 3 and Propofol infusion, TIVA and Treatment may vary due to age or medical condition  Airway Management Planned: Nasal Cannula  Additional Equipment:   Intra-op Plan:   Post-operative Plan:   Informed Consent: I have reviewed the patients History and Physical, chart, labs and discussed the procedure including the risks, benefits and alternatives for the proposed anesthesia with the patient or authorized representative who has indicated his/her understanding and acceptance.       Plan Discussed with:   Anesthesia Plan Comments:         Anesthesia Quick Evaluation

## 2020-08-23 NOTE — Anesthesia Procedure Notes (Signed)
Procedure Name: General with mask airway Performed by: Kelton Pillar, CRNA Pre-anesthesia Checklist: Patient identified, Emergency Drugs available, Patient being monitored and Suction available Patient Re-evaluated:Patient Re-evaluated prior to induction Oxygen Delivery Method: Simple face mask Induction Type: IV induction Placement Confirmation: positive ETCO2 and CO2 detector

## 2020-08-23 NOTE — Patient Instructions (Signed)
Thank you for allowing the Chronic Care Management team to participate in your care. It was a pleasure speaking with you today. Please feel free to contact me with questions.  Goals Addressed: Patient Care Plan: Diabetes Type 2 (Adult)  Problem Identified: Disease Progression (Diabetes, Type 2)     Long-Range Goal: Disease Progression Prevented or Minimized Completed 08/23/2020  Start Date: 06/11/2020  Expected End Date: 10/09/2020  Priority: High  Note:   Objective:   Lab Results  Component Value Date   HGBA1C 5.6 07/24/2020    Current Barriers:  . Chronic disease management and support related to Diabetes self-management  Case Manager Clinical Goal(s):  Marland Kitchen Over the next 120 days, patient will demonstrate improved adherence to prescribed treatment plan for Diabetes self management as evidenced by monitoring and recording of CBG, adherence to ADA/ carb modified diet and adherence to prescribed medication regimen.  Interventions:  . Collaboration with Steele Sizer, MD regarding development and update of comprehensive plan of care as evidenced by provider attestation and co-signature . Inter-disciplinary care team collaboration (see longitudinal plan of care) . Reviewed medications and compliance with current treatment plan. Reports excellent compliance with medications. Denies incidents or falls related to hypoglycemia. Her spouse confirms that her appetite has improved since our last outreach. She is still attempting to increase her daily intake of proteins and eating small meals as tolerated. Her spouse is very knowledgeable of appropriate interventions for hypoglycemia and hyperglycemia and continues to monitor her closely. Her A1C is currently at goal.    Patient Goals/Self-Care Activities - Self administer oral medications as prescribed - Attend all scheduled provider appointments - Continue monitoring blood glucose levels, record readings and utilize recommended interventions -  Maintain adequate hydration and increase intake of nutritional meal supplements    when unable to tolerate regular meals. - Notify provider or care management team with health related questions or new concerns   Goal Met     Patient Care Plan: Wellness (Adult)  Problem Identified: Cognitive Function (Wellness)     Long-Range Goal: Cognitive Function Enhanced   Start Date: 06/11/2020  Expected End Date: 10/09/2020  This Visit's Progress: On track  Recent Progress: On track  Priority: High  Note:   Current Barriers:  . Chronic Care Management support needs r/t cognitive function.  Clinical Goal(s):  Marland Kitchen Over the next 90 days, patient will not experience complications r/t decline in cognitive function.  Interventions:  . Evaluation of current treatment plan related to  self-management and patient's adherence to plan as established by provider. . Collaboration with Steele Sizer, MD regarding development and update of comprehensive plan of care as evidenced by provider attestation and co-signature. Bertram Savin care team collaboration (see longitudinal plan of care) . Discussed concerns r/t cognitive decline. Reports doing well over the past few weeks. She continues to perform ADL's independently. Her spouse remains available to assist with transportation and tasks in the home. Denies recent falls over decline in function. Reports following recommended safety measures. Declines current need for additional assistance.  . Reviewed pending appointments. She will complete a clinic follow-up with the Neurology team on 08/28/20.   Patient Goals/Self Care Activities:  -Continue taking medications as prescribed. -Adhere to recommended fall prevention measures -Update the care management team if in-home assistance is needed. -Attend follow up with Neurologist Dr. Melrose Nakayama as scheduled.   Follow Up Plan:  Will follow up next month.       Mr and Mrs. Shira verbalized understanding  of the  information discussed during the telephonic outreach today. Declined need for mailed/printed instructions. A member of the care management team will follow up next month.     [facility] Care [facility] [phone]

## 2020-08-23 NOTE — Op Note (Signed)
[facility] Gastroenterology Patient Name: Yvonne Curtis Procedure Date: 08/23/2020 9:37 AM MRN: [id number] Account #: [id number] Date of Birth: [birth year] Admit Type: Outpatient Age: 70 Room: [facility] ENDO ROOM 4 Gender: Female Note Status: Finalized Procedure:             Upper GI endoscopy Indications:           Dysphagia, Esophageal reflux Providers:             Lucilla Lame MD, MD Referring MD:          Bethena Roys. Sowles, MD (Referring MD) Medicines:             Propofol per Anesthesia Complications:         No immediate complications. Procedure:             Pre-Anesthesia Assessment:                        - Prior to the procedure, a History and Physical was                         performed, and patient medications and allergies were                         reviewed. The patient's tolerance of previous                         anesthesia was also reviewed. The risks and benefits                         of the procedure and the sedation options and risks                         were discussed with the patient. All questions were                         answered, and informed consent was obtained. Prior                         Anticoagulants: The patient has taken no previous                         anticoagulant or antiplatelet agents. ASA Grade                         Assessment: II - A patient with mild systemic disease.                         After reviewing the risks and benefits, the patient                         was deemed in satisfactory condition to undergo the                         procedure.                        After obtaining informed consent, the endoscope was  passed under direct vision. Throughout the procedure,                         the patient's blood pressure, pulse, and oxygen                         saturations were monitored continuously. The Endoscope                         was introduced through the mouth,  and advanced to the                         second part of duodenum. The upper GI endoscopy was                         accomplished without difficulty. The patient tolerated                         the procedure well. Findings:      A small hiatal hernia was present.      The stomach was normal.      The examined duodenum was normal. Impression:            - Small hiatal hernia.                        - Normal stomach.                        - Normal examined duodenum.                        - No specimens collected. Recommendation:        - Discharge patient to home.                        - Resume previous diet.                        - Continue present medications. Procedure Code(s):     --- Professional ---                        [phone], Esophagogastroduodenoscopy, flexible,                         transoral; diagnostic, including collection of                         specimen(s) by brushing or washing, when performed                         (separate procedure) Diagnosis Code(s):     --- Professional ---                        R13.10, Dysphagia, unspecified                        K21.9, Gastro-esophageal reflux disease without                         esophagitis CPT copyright 2019 American Medical  Association. All rights reserved. The codes documented in this report are preliminary and upon coder review may  be revised to meet current compliance requirements. Lucilla Lame MD, MD 08/23/2020 10:10:38 AM This report has been signed electronically. Number of Addenda: 0 Note Initiated On: 08/23/2020 9:37 AM Estimated Blood Loss:  Estimated blood loss: none.      [place]

## 2020-08-23 NOTE — Anesthesia Postprocedure Evaluation (Signed)
Anesthesia Post Note  Patient: Yvonne Curtis  Procedure(s) Performed: ESOPHAGOGASTRODUODENOSCOPY (EGD) WITH PROPOFOL (N/A )  Patient location during evaluation: Endoscopy Anesthesia Type: General Level of consciousness: awake and alert Pain management: pain level controlled Vital Signs Assessment: post-procedure vital signs reviewed and stable Respiratory status: spontaneous breathing and respiratory function stable Cardiovascular status: stable Anesthetic complications: no   No complications documented.   Last Vitals:  Vitals:   08/23/20 1035 08/23/20 1045  BP: (!) 99/58 (!) 106/54  Pulse: 82 78  Resp: 13 13  Temp:    SpO2: 100% 96%    Last Pain:  Vitals:   08/23/20 1045  TempSrc:   PainSc: 0-No pain                 Kaylla Cobos K

## 2020-08-24 ENCOUNTER — Encounter: Payer: Self-pay | Admitting: Gastroenterology

## 2020-08-28 ENCOUNTER — Other Ambulatory Visit: Payer: Self-pay

## 2020-08-28 ENCOUNTER — Ambulatory Visit
Admission: RE | Admit: 2020-08-28 | Discharge: 2020-08-28 | Disposition: A | Discharge status: Home / Self Care | Payer: HMO | Source: Ambulatory Visit | Attending: Nephrology | Admitting: Nephrology

## 2020-08-28 DIAGNOSIS — N189 Chronic kidney disease, unspecified: Secondary | ICD-10-CM | POA: Diagnosis not present

## 2020-08-28 DIAGNOSIS — R809 Proteinuria, unspecified: Secondary | ICD-10-CM | POA: Diagnosis not present

## 2020-08-28 DIAGNOSIS — E1122 Type 2 diabetes mellitus with diabetic chronic kidney disease: Secondary | ICD-10-CM | POA: Insufficient documentation

## 2020-08-28 DIAGNOSIS — N1832 Chronic kidney disease, stage 3b: Secondary | ICD-10-CM | POA: Diagnosis not present

## 2020-08-28 DIAGNOSIS — D638 Anemia in other chronic diseases classified elsewhere: Secondary | ICD-10-CM | POA: Diagnosis not present

## 2020-08-30 DIAGNOSIS — D631 Anemia in chronic kidney disease: Secondary | ICD-10-CM | POA: Diagnosis not present

## 2020-08-30 DIAGNOSIS — E1122 Type 2 diabetes mellitus with diabetic chronic kidney disease: Secondary | ICD-10-CM | POA: Diagnosis not present

## 2020-08-30 DIAGNOSIS — N1832 Chronic kidney disease, stage 3b: Secondary | ICD-10-CM | POA: Diagnosis not present

## 2020-08-30 DIAGNOSIS — I10 Essential (primary) hypertension: Secondary | ICD-10-CM | POA: Diagnosis not present

## 2020-08-30 DIAGNOSIS — R809 Proteinuria, unspecified: Secondary | ICD-10-CM | POA: Diagnosis not present

## 2020-08-31 ENCOUNTER — Ambulatory Visit: Payer: HMO | Admitting: *Deleted

## 2020-08-31 ENCOUNTER — Ambulatory Visit: Payer: HMO | Admitting: Family Medicine

## 2020-08-31 DIAGNOSIS — E1122 Type 2 diabetes mellitus with diabetic chronic kidney disease: Secondary | ICD-10-CM | POA: Diagnosis not present

## 2020-08-31 DIAGNOSIS — F09 Unspecified mental disorder due to known physiological condition: Secondary | ICD-10-CM

## 2020-08-31 DIAGNOSIS — N182 Chronic kidney disease, stage 2 (mild): Secondary | ICD-10-CM | POA: Diagnosis not present

## 2020-08-31 DIAGNOSIS — F418 Other specified anxiety disorders: Secondary | ICD-10-CM | POA: Diagnosis not present

## 2020-08-31 DIAGNOSIS — R413 Other amnesia: Secondary | ICD-10-CM

## 2020-09-02 NOTE — Patient Instructions (Signed)
Visit Information  Goals Addressed            This Visit's Progress   . Find Help in My Community       Timeframe:  Long-Range Goal Priority:  Medium Start Date:    06/15/20                         Expected End Date:    11/23/20                   Follow Up Date 09/13/20   - begin a notebook of services in my neighborhood or community - continue to follow-up on any referrals for help I am given- I.e. [facility]-given direction to call back to check on status of referral  - think ahead to make sure my need does not become an emergency - make a list of family or friends that I can call  - continue daily walks-15 minutes per day    Why is this important?    Knowing how and where to find help for yourself or family in your neighborhood and community is an important skill.   You will want to take some steps to learn how.    Notes:        The patient verbalized understanding of instructions, educational materials, and care plan provided today and declined offer to receive copy of patient instructions, educational materials, and care plan.   Telephone follow up appointment with care management team member scheduled for:09/10/20  Elliot Gurney, Effie Worker  [facility] Care Management [phone]

## 2020-09-02 NOTE — Chronic Care Management (AMB) (Signed)
Chronic Care Management    Clinical Social Work Note  09/02/2020 Name: Yvonne Curtis MRN: [id number] DOB: 1951/01/29  Yvonne Curtis is a 70 y.o. year old female who is a primary care patient of Steele Sizer, MD. The CCM team was consulted to assist the patient with chronic disease management and/or care coordination needs related to: [employer] .   Engaged with patient by telephone for follow up visit in response to provider referral for social work chronic care management and care coordination services.   Consent to Services:  The patient was given information about Chronic Care Management services, agreed to services, and gave verbal consent prior to initiation of services.  Please see initial visit note for detailed documentation.   Patient agreed to services and consent obtained.   Assessment: Review of patient past medical history, allergies, medications, and health status, including review of relevant consultants reports was performed today as part of a comprehensive evaluation and provision of chronic care management and care coordination services.     SDOH (Social Determinants of Health) assessments and interventions performed:    Advanced Directives Status: Not addressed in this encounter.  CCM Care Plan  Allergies  Allergen Reactions  . Oxycodone Other (See Comments)  . Oxycodone Hcl     insomnia, agitation    Outpatient Encounter Medications as of 08/31/2020  Medication Sig  . acetaminophen (TYLENOL) 500 MG tablet Take by mouth.  Marland Kitchen amLODipine (NORVASC) 10 MG tablet Take 1 tablet (10 mg total) by mouth daily.  Marland Kitchen atorvastatin (LIPITOR) 40 MG tablet Take 1 tablet (40 mg total) by mouth daily.  . benzonatate (TESSALON) 100 MG capsule Take 1-2 capsules (100-200 mg total) by mouth 2 (two) times daily as needed.  . blood glucose meter kit and supplies Dispense based on patient and insurance preference. Use once daily as directed. (FOR ICD-10 E10.9,  E11.9). STrips 100 Lancets 100  . cetirizine (ZYRTEC) 10 MG tablet Take 1 tablet (10 mg total) by mouth daily as needed.  . Cholecalciferol (VITAMIN D3) 2000 UNITS capsule VITAMIN D3, 2000UNIT (Oral Capsule)  2 po qday for 0 days  Quantity: 60.00;  Refills: 0   Ordered :15-May-2010  Steele Sizer MD;  Buddy Duty 05-October-2008 Active  . citalopram (CELEXA) 10 MG tablet Take 1 tablet (10 mg total) by mouth daily.  . Lancets (ONETOUCH DELICA PLUS [id number]) MISC Use as directed to check blood sugar  . memantine (NAMENDA) 5 MG tablet Take 5 mg by mouth 2 (two) times daily.  . metFORMIN (GLUCOPHAGE) 500 MG tablet Take 1 tablet (500 mg total) by mouth every evening.  . mirtazapine (REMERON) 7.5 MG tablet Take 1 tablet by mouth at bedtime.  Glory Rosebush ULTRA test strip 1 each daily. Use as directed to check blood sugar  . pantoprazole (PROTONIX) 40 MG tablet Take 1 tablet (40 mg total) by mouth daily.  Marland Kitchen telmisartan-hydrochlorothiazide (MICARDIS HCT) 80-25 MG tablet Take 1 tablet by mouth daily.   No facility-administered encounter medications on file as of 08/31/2020.    Patient Active Problem List   Diagnosis Date Noted  . Dysphagia   . History of colonic polyps   . Polyp of transverse colon   . Abnormal sense of taste 12/19/2019  . Loss of memory 12/19/2019  . Mood changes 12/19/2019  . Acanthosis nigricans 11/14/2014  . Benign hypertension 11/14/2014  . Dyslipidemia 11/14/2014  . Gastro-esophageal reflux disease without esophagitis 11/14/2014  . Extreme obesity 11/14/2014  . Primary osteoarthritis of both knees  11/14/2014  . Microalbuminuria 10/15/2012  . Controlled type 2 diabetes mellitus with microalbuminuria ([place]) 06/05/2009  . Body dermatophytosis 12/13/2008    Conditions to be addressed/monitored: Dementia; Memory Deficits  Care Plan : General Social Work (Adult)  Updates made by Vern Claude, LCSW since 09/02/2020 12:00 AM    Problem: Caregiver Stress     Long-Range  Goal: Caregiver Coping Optimized   Start Date: 06/15/2020  Expected End Date: 10/12/2020  Recent Progress: On track  Priority: Medium  Note:   Current Barriers:  . Level of care concerns, Memory Deficits, Inability to perform IADL's independently, and Lacks knowledge of community resource: related to appropriate Day Programs . Unable to perform IADLs independently  Clinical Social Work Clinical Goal(s):  Marland Kitchen Over the next 90 days, patient will work with SW to address concerns related to assistance with patient's socialization  Interventions: . 1:1 collaboration with Steele Sizer, MD regarding development and update of comprehensive plan of care as evidenced by provider attestation and co-signature . Inter-disciplinary care team collaboration (see longitudinal plan of care) . [name] interviewed and appropriate assessments performed . [name] continues to express concerns about patient's memory challenges her appetite is slowly improvement . [name] confirmed continued follow up with specialist as needed . [name] confirmed follow up with the [facility] and was informed that they are bringing in the existing residents and will begin processing new referrals next week, however has received no call to date . This Education officer, museum agreed to follow up with the [facility] to follow up on referral. . [name] provided with emotional support in regards to the progression of patient's condition and positive reinforcement for motivation to provide care . Continued to assess for additional community resource needs and reinforced need to call the Day Program every couple of days to check status . Discussed plans with patient for ongoing care management follow up and provided patient with direct contact information for care management team  Patient Goals/Self-Care Activities Over the next 90 days, patient will:   - Patient will attend all  scheduled provider appointments - begin a notebook of services in my neighborhood or community - follow-up on any referrals for help I am given, specifically [facility] - think ahead to make sure my need does not become an emergency - make a list of family or friends that I can call    Follow up Plan: SW will follow up with patient by phone over the next 14 business day s          Follow Up Plan: SW will follow up with patient by phone over the next 14 business days      [employer], [place] Worker  [facility] Care Management [phone]

## 2020-09-03 NOTE — Progress Notes (Signed)
Name: Yvonne Curtis   MRN: [id number]    DOB: 03/13/1951   Date:09/04/2020       Progress Note  Subjective  Chief Complaint  Follow Up  HPI    DMII with renal manifestation: she has been taking Metformin daily and denies side effects,including no diarrhea, she denies hypoglycemic episode. HerhgbA1C was 5.7% ,  6.1 % today it is down to  She denies polyphagia, polyuria or polydipsia.Last urine micro negative but she has a history of microalbuminuria that resolved withARB. She is off Metformin and has gained a little weight. We will recheck A1C next visit   Depression: husband states she seems to be doing better, taking medication as prescribed  Morbid obesity/Malnutrition: she is still obese, but had lost 37 lbs in the past year, since her last visit with me 6 weeks ago , appetite has improved, she still has taste perversion and is waiting to go to [facility] for second opinion. She is taking PPI, EGD showed hiatal hernia, but no ulcers. Dysphagia has resolved  Seasonal allergies: some rhinorrhea, post-nasal drainage and a cough that is worse at night. Husband states zyrtec not covered by insurance. We will try singulair to see if it helps, also explain he can get Zyrtec of Loratadine otc   CKI stage III: seen by Dr. Lanora Manis - nephrologist recently, BP is at goal, per his note most likely due to hypertensive nephrosclerosis and DM, she denies pruritis , she has good urine output   Patient Active Problem List   Diagnosis Date Noted  . Dysphagia   . History of colonic polyps   . Polyp of transverse colon   . Abnormal sense of taste 12/19/2019  . Loss of memory 12/19/2019  . Mood changes 12/19/2019  . Acanthosis nigricans 11/14/2014  . Benign hypertension 11/14/2014  . Dyslipidemia 11/14/2014  . Gastro-esophageal reflux disease without esophagitis 11/14/2014  . Extreme obesity 11/14/2014  . Primary osteoarthritis of both knees 11/14/2014  . Microalbuminuria 10/15/2012  . Controlled  type 2 diabetes mellitus with microalbuminuria ([place]) 06/05/2009  . Body dermatophytosis 12/13/2008    Past Surgical History:  Procedure Laterality Date  . CHOLECYSTECTOMY    . COLONOSCOPY WITH PROPOFOL N/A 11/30/2014   Procedure: COLONOSCOPY WITH PROPOFOL;  Surgeon: Lucilla Lame, MD;  Location: [place];  Service: Endoscopy;  Laterality: N/A;  WITH BIOPSY-- TRANSVERSE COLON POLYP  X 2 DESCENING COLON POLYP  . COLONOSCOPY WITH PROPOFOL N/A 01/17/2020   Procedure: COLONOSCOPY WITH PROPOFOL;  Surgeon: Lucilla Lame, MD;  Location: [facility] ENDOSCOPY;  Service: Endoscopy;  Laterality: N/A;  . ESOPHAGOGASTRODUODENOSCOPY (EGD) WITH PROPOFOL N/A 08/23/2020   Procedure: ESOPHAGOGASTRODUODENOSCOPY (EGD) WITH PROPOFOL;  Surgeon: Lucilla Lame, MD;  Location: [facility] ENDOSCOPY;  Service: Endoscopy;  Laterality: N/A;  . GALLBLADDER SURGERY    . SEPTOPLASTY    . tonsillectomy    . TONSILLECTOMY    . TUBAL LIGATION      Family History  Problem Relation Age of Onset  . Colon cancer Mother   . Lung cancer Father   . Hypertension Sister   . Hypertension Sister   . Breast cancer Neg Hx     Social History   Tobacco Use  . Smoking status: Never Smoker  . Smokeless tobacco: Never Used  Substance Use Topics  . Alcohol use: No    Alcohol/week: 0.0 standard drinks     Current Outpatient Medications:  .  acetaminophen (TYLENOL) 500 MG tablet, Take by mouth., Disp: , Rfl:  .  amLODipine (  NORVASC) 10 MG tablet, Take 1 tablet (10 mg total) by mouth daily., Disp: 90 tablet, Rfl: 1 .  benzonatate (TESSALON) 100 MG capsule, Take 1-2 capsules (100-200 mg total) by mouth 2 (two) times daily as needed., Disp: 40 capsule, Rfl: 0 .  blood glucose meter kit and supplies, Dispense based on patient and insurance preference. Use once daily as directed. (FOR ICD-10 E10.9, E11.9). STrips 100 Lancets 100, Disp: 1 each, Rfl: 0 .  cetirizine (ZYRTEC) 10 MG tablet, Take 1 tablet (10 mg total) by mouth daily as  needed., Disp: 90 tablet, Rfl: 1 .  Cholecalciferol (VITAMIN D3) 2000 UNITS capsule, VITAMIN D3, 2000UNIT (Oral Capsule)  2 po qday for 0 days  Quantity: 60.00;  Refills: 0   Ordered :15-May-2010  Steele Sizer MD;  Buddy Duty 05-October-2008 Active, Disp: , Rfl:  .  citalopram (CELEXA) 10 MG tablet, Take 1 tablet (10 mg total) by mouth daily., Disp: 90 tablet, Rfl: 0 .  Lancets (ONETOUCH DELICA PLUS [id number]) MISC, Use as directed to check blood sugar, Disp: , Rfl:  .  memantine (NAMENDA) 5 MG tablet, Take 5 mg by mouth 2 (two) times daily., Disp: , Rfl:  .  mirtazapine (REMERON) 7.5 MG tablet, Take 1 tablet by mouth at bedtime., Disp: , Rfl:  .  montelukast (SINGULAIR) 10 MG tablet, Take 1 tablet (10 mg total) by mouth at bedtime., Disp: 90 tablet, Rfl: 1 .  ONETOUCH ULTRA test strip, 1 each daily. Use as directed to check blood sugar, Disp: , Rfl:  .  pantoprazole (PROTONIX) 40 MG tablet, Take 1 tablet (40 mg total) by mouth daily., Disp: 30 tablet, Rfl: 11 .  telmisartan-hydrochlorothiazide (MICARDIS HCT) 80-25 MG tablet, Take 1 tablet by mouth daily., Disp: 90 tablet, Rfl: 1 .  atorvastatin (LIPITOR) 40 MG tablet, Take 1 tablet (40 mg total) by mouth daily., Disp: 90 tablet, Rfl: 1  Allergies  Allergen Reactions  . Oxycodone Other (See Comments)  . Oxycodone Hcl     insomnia, agitation    I personally reviewed active problem list, medication list, allergies, family history, social history, health maintenance with the patient/caregiver today.   ROS  Ten systems reviewed and is negative except as mentioned in HPI   Objective  Vitals:   09/04/20 1054  BP: 128/70  Pulse: 94  Resp: 16  Temp: 98 F (36.7 C)  TempSrc: Oral  SpO2: 99%  Weight: 181 lb (82.1 kg)  Height: 5' (1.524 m)    Body mass index is 35.35 kg/m.  Physical Exam  Constitutional: Patient appears well-developed and well-nourished. Obese  No distress.  HEENT: head atraumatic, normocephalic, pupils equal and  reactive to light,neck supple Cardiovascular: Normal rate, regular rhythm and normal heart sounds.  No murmur heard. No BLE edema. Pulmonary/Chest: Effort normal and breath sounds normal. No respiratory distress. Abdominal: Soft.  There is no tenderness. Psychiatric: Patient has a normal mood and affect.looks at her husband for reassurance and to help with answers . Laughed at the end of the visit when I asked if she is dancing again   Recent Results (from the past 2160 hour(s))  TSH     Status: Abnormal   Collection Time: 07/24/20 12:00 AM  Result Value Ref Range   TSH 0.23 (L) 0.40 - 4.50 mIU/L  COMPLETE METABOLIC PANEL WITH GFR     Status: Abnormal   Collection Time: 07/24/20 12:00 AM  Result Value Ref Range   Glucose, Bld 91 65 - 99 mg/dL  Comment: .            Fasting reference interval .    BUN 15 7 - 25 mg/dL   Creat 1.59 (H) 0.60 - 0.93 mg/dL    Comment: For patients >70 years of age, the reference limit for Creatinine is approximately 13% higher for people identified as African-American. .    GFR, Est Non African American 33 (L) > OR = 60 mL/min/1.56m2   GFR, Est African American 38 (L) > OR = 60 mL/min/1.43m2   BUN/Creatinine Ratio 9 6 - 22 (calc)   Sodium 137 135 - 146 mmol/L   Potassium 3.9 3.5 - 5.3 mmol/L   Chloride 98 98 - 110 mmol/L   CO2 30 20 - 32 mmol/L   Calcium 10.6 (H) 8.6 - 10.4 mg/dL   Total Protein 7.5 6.1 - 8.1 g/dL   Albumin 4.4 3.6 - 5.1 g/dL   Globulin 3.1 1.9 - 3.7 g/dL (calc)   AG Ratio 1.4 1.0 - 2.5 (calc)   Total Bilirubin 0.6 0.2 - 1.2 mg/dL   Alkaline phosphatase (APISO) 70 37 - 153 U/L   AST 16 10 - 35 U/L   ALT 12 6 - 29 U/L  CBC with Differential/Platelet     Status: Abnormal   Collection Time: 07/24/20 12:00 AM  Result Value Ref Range   WBC 10.7 3.8 - 10.8 Thousand/uL   RBC 4.24 3.80 - 5.10 Million/uL   Hemoglobin 11.5 (L) 11.7 - 15.5 g/dL   HCT 36.4 35.0 - 45.0 %   MCV 85.8 80.0 - 100.0 fL   MCH 27.1 27.0 - 33.0 pg   MCHC  31.6 (L) 32.0 - 36.0 g/dL   RDW 14.9 11.0 - 15.0 %   Platelets 487 (H) 140 - 400 Thousand/uL   MPV 10.7 7.5 - 12.5 fL   Neutro Abs 8,795 (H) 1,500 - 7,800 cells/uL   Lymphs Abs 1,380 850 - 3,900 cells/uL   Absolute Monocytes 407 200 - 950 cells/uL   Eosinophils Absolute 75 15 - 500 cells/uL   Basophils Absolute 43 0 - 200 cells/uL   Neutrophils Relative % 82.2 %   Total Lymphocyte 12.9 %   Monocytes Relative 3.8 %   Eosinophils Relative 0.7 %   Basophils Relative 0.4 %  Lipid panel     Status: None   Collection Time: 07/24/20 12:00 AM  Result Value Ref Range   Cholesterol 111 <200 mg/dL   HDL 50 > OR = 50 mg/dL   Triglycerides 86 <150 mg/dL   LDL Cholesterol (Calc) 44 mg/dL (calc)    Comment: Reference range: <100 . Desirable range <100 mg/dL for primary prevention;   <70 mg/dL for patients with CHD or diabetic patients  with > or = 2 CHD risk factors. Marland Kitchen LDL-C is now calculated using the Martin-Hopkins  calculation, which is a validated novel method providing  better accuracy than the Friedewald equation in the  estimation of LDL-C.  Cresenciano Genre et al. Annamaria Helling. 9798;921(19): 2061-2068  (http://education.[facility].com/faq/FAQ164)    Total CHOL/HDL Ratio 2.2 <5.0 (calc)   Non-HDL Cholesterol (Calc) 61 <130 mg/dL (calc)    Comment: For patients with diabetes plus 1 major ASCVD risk  factor, treating to a non-HDL-C goal of <100 mg/dL  (LDL-C of <70 mg/dL) is considered a therapeutic  option.   VITAMIN D 25 Hydroxy (Vit-D Deficiency, Fractures)     Status: None   Collection Time: 07/24/20 12:00 AM  Result Value Ref Range   Vit D, 25-Hydroxy  85 30 - 100 ng/mL    Comment: Vitamin D Status         25-OH Vitamin D: . Deficiency:                    <20 ng/mL Insufficiency:             20 - 29 ng/mL Optimal:                 > or = 30 ng/mL . For 25-OH Vitamin D testing on patients on  D2-supplementation and patients for whom quantitation  of D2 and D3 fractions is  required, the QuestAssureD(TM) 25-OH VIT D, (D2,D3), LC/MS/MS is recommended: order  code [phone] (patients >26yrs). See Note 1 . Note 1 . For additional information, please refer to  http://education.[facility].com/faq/FAQ199  (This link is being provided for informational/ educational purposes only.)   POCT HgB A1C     Status: Normal   Collection Time: 07/24/20  8:29 AM  Result Value Ref Range   Hemoglobin A1C 5.6 4.0 - 5.6 %   HbA1c POC (<> result, manual entry)     HbA1c, POC (prediabetic range)     HbA1c, POC (controlled diabetic range)    Glucose, capillary     Status: None   Collection Time: 08/23/20  9:17 AM  Result Value Ref Range   Glucose-Capillary 95 70 - 99 mg/dL    Comment: Glucose reference range applies only to samples taken after fasting for at least 8 hours.      PHQ2/9: Depression screen [facility] 2/9 09/04/2020 07/24/2020 06/11/2020 06/01/2020 04/17/2020  Decreased Interest 2 2 1 3 3   Down, Depressed, Hopeless 2 0 0 0 0  PHQ - 2 Score 4 2 1 3 3   Altered sleeping 0 0 - 3 0  Tired, decreased energy 0 0 - 3 1  Change in appetite 0 3 - 3 2  Feeling bad or failure about yourself  0 0 - 0 0  Trouble concentrating 0 0 - 3 2  Moving slowly or fidgety/restless 0 0 - 3 0  Suicidal thoughts 0 0 - 0 0  PHQ-9 Score 4 5 - 18 8  Difficult doing work/chores - - - Very difficult Very difficult  Some recent data might be hidden    phq 9 is positive   Fall Risk: Fall Risk  09/04/2020 07/24/2020 07/04/2020 06/01/2020 04/17/2020  Falls in the past year? 1 1 1 1 1   Number falls in past yr: 1 1 1 1 1   Injury with Fall? 0 0 0 0 0  Risk for fall due to : - - History of fall(s);Other (Comment) History of fall(s) History of fall(s)  Risk for fall due to: Comment - - Reports recent fall r/t decreased intake - -  Follow up - - Falls prevention discussed - Falls prevention discussed     Functional Status Survey: Is the patient deaf or have difficulty hearing?: No Does the patient  have difficulty seeing, even when wearing glasses/contacts?: No Does the patient have difficulty concentrating, remembering, or making decisions?: Yes Does the patient have difficulty walking or climbing stairs?: Yes Does the patient have difficulty dressing or bathing?: Yes Does the patient have difficulty doing errands alone such as visiting a doctor's office or shopping?: Yes    Assessment & Plan  1. Dyslipidemia  - atorvastatin (LIPITOR) 40 MG tablet; Take 1 tablet (40 mg total) by mouth daily.  Dispense: 90 tablet; Refill: 1  2. Depression  with anxiety  Stable on medication   3. Nocturnal cough  - montelukast (SINGULAIR) 10 MG tablet; Take 1 tablet (10 mg total) by mouth at bedtime.  Dispense: 90 tablet; Refill: 1  4. Other seasonal allergic rhinitis  - montelukast (SINGULAIR) 10 MG tablet; Take 1 tablet (10 mg total) by mouth at bedtime.  Dispense: 90 tablet; Refill: 1   5. Memory loss  [facility]  6. Dyslipidemia  - atorvastatin (LIPITOR) 40 MG tablet; Take 1 tablet (40 mg total) by mouth daily.  Dispense: 90 tablet; Refill: 1  7. Taste perversion   8. Chronic kidney disease, stage 3b [facility])  Seeing nephrologist   9. Hypertension associated with stage 3b chronic kidney disease due to type 2 diabetes mellitus ([place])

## 2020-09-04 ENCOUNTER — Encounter: Payer: Self-pay | Admitting: Family Medicine

## 2020-09-04 ENCOUNTER — Ambulatory Visit (INDEPENDENT_AMBULATORY_CARE_PROVIDER_SITE_OTHER): Payer: HMO | Admitting: Family Medicine

## 2020-09-04 ENCOUNTER — Other Ambulatory Visit: Payer: Self-pay

## 2020-09-04 VITALS — BP 128/70 | HR 94 | Temp 98.0°F | Resp 16 | Ht 60.0 in | Wt 181.0 lb

## 2020-09-04 DIAGNOSIS — E785 Hyperlipidemia, unspecified: Secondary | ICD-10-CM | POA: Diagnosis not present

## 2020-09-04 DIAGNOSIS — E1122 Type 2 diabetes mellitus with diabetic chronic kidney disease: Secondary | ICD-10-CM

## 2020-09-04 DIAGNOSIS — I129 Hypertensive chronic kidney disease with stage 1 through stage 4 chronic kidney disease, or unspecified chronic kidney disease: Secondary | ICD-10-CM | POA: Diagnosis not present

## 2020-09-04 DIAGNOSIS — J302 Other seasonal allergic rhinitis: Secondary | ICD-10-CM

## 2020-09-04 DIAGNOSIS — E440 Moderate protein-calorie malnutrition: Secondary | ICD-10-CM

## 2020-09-04 DIAGNOSIS — R413 Other amnesia: Secondary | ICD-10-CM

## 2020-09-04 DIAGNOSIS — R432 Parageusia: Secondary | ICD-10-CM | POA: Diagnosis not present

## 2020-09-04 DIAGNOSIS — N1832 Chronic kidney disease, stage 3b: Secondary | ICD-10-CM | POA: Diagnosis not present

## 2020-09-04 DIAGNOSIS — R058 Other specified cough: Secondary | ICD-10-CM

## 2020-09-04 DIAGNOSIS — F418 Other specified anxiety disorders: Secondary | ICD-10-CM

## 2020-09-04 MED ORDER — MONTELUKAST SODIUM 10 MG PO TABS: 10.0000 mg | ORAL_TABLET | Freq: Every day | ORAL | 1 refills | Status: DC

## 2020-09-04 MED ORDER — ATORVASTATIN CALCIUM 40 MG PO TABS: 40.0000 mg | ORAL_TABLET | Freq: Every day | ORAL | 1 refills | Status: DC

## 2020-09-04 NOTE — Patient Instructions (Signed)
Referral has been sent to [facility]  P: [phone] F: [phone]  Release ID # [id number]

## 2020-09-05 ENCOUNTER — Encounter: Payer: Self-pay | Admitting: *Deleted

## 2020-09-05 ENCOUNTER — Telehealth: Payer: Self-pay | Admitting: *Deleted

## 2020-09-05 NOTE — Telephone Encounter (Signed)
   09/05/2020  BETH GOODLIN [birth year] [id number]  Phone call to the [facility] to discuss patient's enrollment. Message left for the Director Marlowe Kays to return my call for an update.  [place], [facility] [phone]

## 2020-09-05 NOTE — Telephone Encounter (Signed)
This encounter was created in error - please disregard.

## 2020-09-07 ENCOUNTER — Ambulatory Visit (INDEPENDENT_AMBULATORY_CARE_PROVIDER_SITE_OTHER): Payer: HMO | Admitting: *Deleted

## 2020-09-07 DIAGNOSIS — F09 Unspecified mental disorder due to known physiological condition: Secondary | ICD-10-CM

## 2020-09-07 DIAGNOSIS — F418 Other specified anxiety disorders: Secondary | ICD-10-CM

## 2020-09-07 DIAGNOSIS — R413 Other amnesia: Secondary | ICD-10-CM

## 2020-09-07 NOTE — Patient Instructions (Signed)
Visit Information  Goals Addressed            This Visit's Progress   . Find Help in My Community       Timeframe:  Long-Range Goal Priority:  Medium Start Date:    06/15/20                         Expected End Date:    11/23/20                   Follow Up Date 09/21/20   - begin a notebook of services in my neighborhood or community - continue to follow-up on any referrals for help I am given- I.e. [facility]-given direction to call back to check on status of referral  - think ahead to make sure my need does not become an emergency - make a list of family or friends that I can call  - continue daily walks-15 minutes per day    Why is this important?    Knowing how and where to find help for yourself or family in your neighborhood and community is an important skill.   You will want to take some steps to learn how.    Notes:        The patient verbalized understanding of instructions, educational materials, and care plan provided today and declined offer to receive copy of patient instructions, educational materials, and care plan.   Telephone follow up appointment with care management team member scheduled for:  09/14/20   Elliot Gurney, [place] Worker  [facility] Care Management [phone]

## 2020-09-07 NOTE — Chronic Care Management (AMB) (Signed)
Chronic Care Management    Clinical Social Work Note  09/07/2020 Name: Yvonne MICHAELIS MRN: [id number] DOB: 08-18-1950  Yvonne Curtis is a 70 y.o. year old female who is a primary care patient of Steele Sizer, MD. The CCM team was consulted to assist the patient with chronic disease management and/or care coordination needs related to: [employer] .   Engaged with patient by telephone for follow up visit in response to provider referral for social work chronic care management and care coordination services.   Consent to Services:  The patient was given information about Chronic Care Management services, agreed to services, and gave verbal consent prior to initiation of services.  Please see initial visit note for detailed documentation.   Patient agreed to services and consent obtained.   Assessment: Review of patient past medical history, allergies, medications, and health status, including review of relevant consultants reports was performed today as part of a comprehensive evaluation and provision of chronic care management and care coordination services.     SDOH (Social Determinants of Health) assessments and interventions performed:    Advanced Directives Status: Not addressed in this encounter.  CCM Care Plan  Allergies  Allergen Reactions  . Oxycodone Other (See Comments)  . Oxycodone Hcl     insomnia, agitation    Outpatient Encounter Medications as of 09/07/2020  Medication Sig  . acetaminophen (TYLENOL) 500 MG tablet Take by mouth.  Marland Kitchen amLODipine (NORVASC) 10 MG tablet Take 1 tablet (10 mg total) by mouth daily.  Marland Kitchen atorvastatin (LIPITOR) 40 MG tablet Take 1 tablet (40 mg total) by mouth daily.  . benzonatate (TESSALON) 100 MG capsule Take 1-2 capsules (100-200 mg total) by mouth 2 (two) times daily as needed.  . blood glucose meter kit and supplies Dispense based on patient and insurance preference. Use once daily as directed. (FOR ICD-10 E10.9,  E11.9). STrips 100 Lancets 100  . cetirizine (ZYRTEC) 10 MG tablet Take 1 tablet (10 mg total) by mouth daily as needed.  . Cholecalciferol (VITAMIN D3) 2000 UNITS capsule VITAMIN D3, 2000UNIT (Oral Capsule)  2 po qday for 0 days  Quantity: 60.00;  Refills: 0   Ordered :15-May-2010  Steele Sizer MD;  Buddy Duty 05-October-2008 Active  . citalopram (CELEXA) 10 MG tablet Take 1 tablet (10 mg total) by mouth daily.  . Lancets (ONETOUCH DELICA PLUS [id number]) MISC Use as directed to check blood sugar  . memantine (NAMENDA) 5 MG tablet Take 5 mg by mouth 2 (two) times daily.  . mirtazapine (REMERON) 7.5 MG tablet Take 1 tablet by mouth at bedtime.  . montelukast (SINGULAIR) 10 MG tablet Take 1 tablet (10 mg total) by mouth at bedtime.  Glory Rosebush ULTRA test strip 1 each daily. Use as directed to check blood sugar  . pantoprazole (PROTONIX) 40 MG tablet Take 1 tablet (40 mg total) by mouth daily.  Marland Kitchen telmisartan-hydrochlorothiazide (MICARDIS HCT) 80-25 MG tablet Take 1 tablet by mouth daily.   No facility-administered encounter medications on file as of 09/07/2020.    Patient Active Problem List   Diagnosis Date Noted  . Chronic kidney disease, stage 3b ([place]) 09/04/2020  . Dysphagia   . History of colonic polyps   . Polyp of transverse colon   . Abnormal sense of taste 12/19/2019  . Loss of memory 12/19/2019  . Mood changes 12/19/2019  . Acanthosis nigricans 11/14/2014  . Benign hypertension 11/14/2014  . Dyslipidemia 11/14/2014  . Gastro-esophageal reflux disease without esophagitis 11/14/2014  . Extreme  obesity 11/14/2014  . Primary osteoarthritis of both knees 11/14/2014  . Microalbuminuria 10/15/2012  . Controlled type 2 diabetes mellitus with microalbuminuria ([place]) 06/05/2009  . Body dermatophytosis 12/13/2008    Conditions to be addressed/monitored: Dementia; Memory Deficits  Care Plan : General Social Work (Adult)  Updates made by Vern Claude, LCSW since 09/07/2020 12:00 AM     Problem: Caregiver Stress     Long-Range Goal: Caregiver Coping Optimized   Start Date: 06/15/2020  Expected End Date: 10/12/2020  This Visit's Progress: On track  Recent Progress: On track  Priority: Medium  Note:   Current Barriers:  . Level of care concerns, Memory Deficits, Inability to perform IADL's independently, and Lacks knowledge of community resource: related to appropriate Day Programs . Unable to perform IADLs independently  Clinical Social Work Clinical Goal(s):  Marland Kitchen Over the next 90 days, patient will work with SW to address concerns related to assistance with patient's socialization  Interventions: . 1:1 collaboration with Steele Sizer, MD regarding development and update of comprehensive plan of care as evidenced by provider attestation and co-signature . Inter-disciplinary care team collaboration (see longitudinal plan of care) . [name] interviewed and appropriate assessments performed . [name] continues to express concerns about patient's memory challenges her appetite is slowly improvement . [name] confirmed continued follow up with specialist as needed . [name] confirmed follow up with the [facility] and was informed that they are bringing in the existing residents and will begin processing new referrals next week, however has received no call to date . Collaboration phone call to the [facility], spoke with Marlowe Kays who states that the program has been certified by the state and they have re-opened on Monday.  Per Marlowe Kays, she is beginning to make calls to families to set up initial screenings and interviews . [name] continues to be provided with emotional support in regards to the progression of patient's condition and positive reinforcement for motivation to provide care . Will continue to follow up with the [facility] . Discussed plans with patient for ongoing care management follow  up and provided patient with direct contact information for care management team  Patient Goals/Self-Care Activities Over the next 90 days, patient will:   - Patient will attend all scheduled provider appointments - begin a notebook of services in my neighborhood or community - follow-up on any referrals for help I am given, specifically [facility] - think ahead to make sure my need does not become an emergency - make a list of family or friends that I can call              Follow Up Plan: SW will follow up with patient by phone over the next 7-14 business days      [place], [place] Worker  [facility] Care Management [phone]

## 2020-09-10 ENCOUNTER — Telehealth: Payer: Self-pay

## 2020-09-14 ENCOUNTER — Ambulatory Visit: Payer: Self-pay | Admitting: *Deleted

## 2020-09-14 DIAGNOSIS — F418 Other specified anxiety disorders: Secondary | ICD-10-CM

## 2020-09-14 DIAGNOSIS — N1832 Chronic kidney disease, stage 3b: Secondary | ICD-10-CM | POA: Diagnosis not present

## 2020-09-14 DIAGNOSIS — I129 Hypertensive chronic kidney disease with stage 1 through stage 4 chronic kidney disease, or unspecified chronic kidney disease: Secondary | ICD-10-CM | POA: Diagnosis not present

## 2020-09-14 DIAGNOSIS — E1122 Type 2 diabetes mellitus with diabetic chronic kidney disease: Secondary | ICD-10-CM | POA: Diagnosis not present

## 2020-09-14 DIAGNOSIS — R413 Other amnesia: Secondary | ICD-10-CM

## 2020-09-14 NOTE — Chronic Care Management (AMB) (Signed)
Chronic Care Management    Clinical Social Work Note  09/14/2020 Name: Yvonne Curtis MRN: [id number] DOB: 1950-09-02  GERARDINE PELTZ is a 70 y.o. year old female who is a primary care patient of Steele Sizer, MD. The CCM team was consulted to assist the patient with chronic disease management and/or care coordination needs related to: [employer] .   Engaged with patient by telephone for follow up visit in response to provider referral for social work chronic care management and care coordination services.   Consent to Services:  The patient was given information about Chronic Care Management services, agreed to services, and gave verbal consent prior to initiation of services.  Please see initial visit note for detailed documentation.   Patient agreed to services and consent obtained.   Assessment: Review of patient past medical history, allergies, medications, and health status, including review of relevant consultants reports was performed today as part of a comprehensive evaluation and provision of chronic care management and care coordination services.     SDOH (Social Determinants of Health) assessments and interventions performed:    Advanced Directives Status: Not addressed in this encounter.  CCM Care Plan  Allergies  Allergen Reactions  . Oxycodone Other (See Comments)  . Oxycodone Hcl     insomnia, agitation    Outpatient Encounter Medications as of 09/14/2020  Medication Sig  . acetaminophen (TYLENOL) 500 MG tablet Take by mouth.  Marland Kitchen amLODipine (NORVASC) 10 MG tablet Take 1 tablet (10 mg total) by mouth daily.  Marland Kitchen atorvastatin (LIPITOR) 40 MG tablet Take 1 tablet (40 mg total) by mouth daily.  . benzonatate (TESSALON) 100 MG capsule Take 1-2 capsules (100-200 mg total) by mouth 2 (two) times daily as needed.  . blood glucose meter kit and supplies Dispense based on patient and insurance preference. Use once daily as directed. (FOR ICD-10 E10.9,  E11.9). STrips 100 Lancets 100  . cetirizine (ZYRTEC) 10 MG tablet Take 1 tablet (10 mg total) by mouth daily as needed.  . Cholecalciferol (VITAMIN D3) 2000 UNITS capsule VITAMIN D3, 2000UNIT (Oral Capsule)  2 po qday for 0 days  Quantity: 60.00;  Refills: 0   Ordered :15-May-2010  Steele Sizer MD;  Buddy Duty 05-October-2008 Active  . citalopram (CELEXA) 10 MG tablet Take 1 tablet (10 mg total) by mouth daily.  . Lancets (ONETOUCH DELICA PLUS [id number]) MISC Use as directed to check blood sugar  . memantine (NAMENDA) 5 MG tablet Take 5 mg by mouth 2 (two) times daily.  . mirtazapine (REMERON) 7.5 MG tablet Take 1 tablet by mouth at bedtime.  . montelukast (SINGULAIR) 10 MG tablet Take 1 tablet (10 mg total) by mouth at bedtime.  Glory Rosebush ULTRA test strip 1 each daily. Use as directed to check blood sugar  . pantoprazole (PROTONIX) 40 MG tablet Take 1 tablet (40 mg total) by mouth daily.  Marland Kitchen telmisartan-hydrochlorothiazide (MICARDIS HCT) 80-25 MG tablet Take 1 tablet by mouth daily.   No facility-administered encounter medications on file as of 09/14/2020.    Patient Active Problem List   Diagnosis Date Noted  . Chronic kidney disease, stage 3b ([place]) 09/04/2020  . Dysphagia   . History of colonic polyps   . Polyp of transverse colon   . Abnormal sense of taste 12/19/2019  . Loss of memory 12/19/2019  . Mood changes 12/19/2019  . Acanthosis nigricans 11/14/2014  . Benign hypertension 11/14/2014  . Dyslipidemia 11/14/2014  . Gastro-esophageal reflux disease without esophagitis 11/14/2014  . Extreme  obesity 11/14/2014  . Primary osteoarthritis of both knees 11/14/2014  . Microalbuminuria 10/15/2012  . Controlled type 2 diabetes mellitus with microalbuminuria ([place]) 06/05/2009  . Body dermatophytosis 12/13/2008    Conditions to be addressed/monitored: Dementia; Memory Deficits  Care Plan : General Social Work (Adult)  Updates made by Vern Claude, LCSW since 09/14/2020 12:00  AM    Problem: Caregiver Stress     Long-Range Goal: Caregiver Coping Optimized   Start Date: 06/15/2020  Expected End Date: 10/12/2020  Recent Progress: On track  Priority: Medium  Note:   Current Barriers:  . Level of care concerns, Memory Deficits, Inability to perform IADL's independently, and Lacks knowledge of community resource: related to appropriate Day Programs . Unable to perform IADLs independently  Clinical Social Work Clinical Goal(s):  Marland Kitchen Over the next 90 days, patient will work with SW to address concerns related to assistance with patient's socialization  Interventions: . 1:1 collaboration with Steele Sizer, MD regarding development and update of comprehensive plan of care as evidenced by provider attestation and co-signature . Inter-disciplinary care team collaboration (see longitudinal plan of care) . [name] interviewed and appropriate assessments performed . [name] continues to express concerns about patient's memory challenges -specifically her short term memory . Patient scheduled to see a Neurologist in [place] on 01/16/21 for a second opinion . [name] confirmed follow up with [facility] however has received no call to date . Alternatives discussed including Day Program at [facility] . [name] continues to be provided with emotional support in regards to the progression of patient's condition and positive reinforcement for motivation to provide care . Will continue to follow up with the [facility] . Discussed plans with patient for ongoing care management follow up and provided patient with direct contact information for care management team  Patient Goals/Self-Care Activities Over the next 90 days, patient will:   - Patient will attend all scheduled provider appointments - begin a notebook of services in my neighborhood or community - follow-up on any referrals for help I am given,  specifically [facility] - think ahead to make sure my need does not become an emergency - make a list of family or friends that I can call              Follow Up Plan: SW will follow up with patient by phone over the next 14 business days      [place], [place] Worker  [facility] [phone]

## 2020-09-14 NOTE — Patient Instructions (Signed)
Visit Information  Goals Addressed            This Visit's Progress   . Find Help in My Community       Timeframe:  Long-Range Goal Priority:  Medium Start Date:    06/15/20                         Expected End Date:    11/23/20                   Follow Up Date 09/28/20   - begin a notebook of services in my neighborhood or community - continue to follow-up on any referrals for help I am given- I.e. [facility]-given direction to call back to check on status of referral  - think ahead to make sure my need does not become an emergency - make a list of family or friends that I can call  - continue daily walks-15 minutes per day    Why is this important?    Knowing how and where to find help for yourself or family in your neighborhood and community is an important skill.   You will want to take some steps to learn how.    Notes:        The patient verbalized understanding of instructions, educational materials, and care plan provided today and declined offer to receive copy of patient instructions, educational materials, and care plan.   Telephone follow up appointment with care management team member scheduled for: 09/28/20  Elliot Gurney, [place] Worker  [facility] [phone]

## 2020-09-18 ENCOUNTER — Ambulatory Visit: Payer: Self-pay | Admitting: *Deleted

## 2020-09-18 DIAGNOSIS — R413 Other amnesia: Secondary | ICD-10-CM

## 2020-09-18 DIAGNOSIS — F418 Other specified anxiety disorders: Secondary | ICD-10-CM

## 2020-09-18 DIAGNOSIS — F09 Unspecified mental disorder due to known physiological condition: Secondary | ICD-10-CM

## 2020-09-18 NOTE — Chronic Care Management (AMB) (Signed)
Chronic Care Management    Clinical Social Work Note  09/18/2020 Name: Yvonne Curtis MRN: [id number] DOB: 05-15-1950  Yvonne Curtis is a 70 y.o. year old female who is a primary care patient of Steele Sizer, MD. The CCM team was consulted to assist the patient with chronic disease management and/or care coordination needs related to: [employer] .   Engaged with patient's spouse and [facility] manager by telephone for follow up visit in response to provider referral for social work chronic care management and care coordination services.   Consent to Services:  The patient was given information about Chronic Care Management services, agreed to services, and gave verbal consent prior to initiation of services.  Please see initial visit note for detailed documentation.   Patient agreed to services and consent obtained.   Assessment: Review of patient past medical history, allergies, medications, and health status, including review of relevant consultants reports was performed today as part of a comprehensive evaluation and provision of chronic care management and care coordination services.     SDOH (Social Determinants of Health) assessments and interventions performed:    Advanced Directives Status: Not addressed in this encounter.  CCM Care Plan  Allergies  Allergen Reactions  . Oxycodone Other (See Comments)  . Oxycodone Hcl     insomnia, agitation    Outpatient Encounter Medications as of 09/18/2020  Medication Sig  . acetaminophen (TYLENOL) 500 MG tablet Take by mouth.  Marland Kitchen amLODipine (NORVASC) 10 MG tablet Take 1 tablet (10 mg total) by mouth daily.  Marland Kitchen atorvastatin (LIPITOR) 40 MG tablet Take 1 tablet (40 mg total) by mouth daily.  . benzonatate (TESSALON) 100 MG capsule Take 1-2 capsules (100-200 mg total) by mouth 2 (two) times daily as needed.  . blood glucose meter kit and supplies Dispense based on patient and insurance preference. Use once  daily as directed. (FOR ICD-10 E10.9, E11.9). STrips 100 Lancets 100  . cetirizine (ZYRTEC) 10 MG tablet Take 1 tablet (10 mg total) by mouth daily as needed.  . Cholecalciferol (VITAMIN D3) 2000 UNITS capsule VITAMIN D3, 2000UNIT (Oral Capsule)  2 po qday for 0 days  Quantity: 60.00;  Refills: 0   Ordered :15-May-2010  Steele Sizer MD;  Buddy Duty 05-October-2008 Active  . citalopram (CELEXA) 10 MG tablet Take 1 tablet (10 mg total) by mouth daily.  . Lancets (ONETOUCH DELICA PLUS [id number]) MISC Use as directed to check blood sugar  . memantine (NAMENDA) 5 MG tablet Take 5 mg by mouth 2 (two) times daily.  . mirtazapine (REMERON) 7.5 MG tablet Take 1 tablet by mouth at bedtime.  . montelukast (SINGULAIR) 10 MG tablet Take 1 tablet (10 mg total) by mouth at bedtime.  Glory Rosebush ULTRA test strip 1 each daily. Use as directed to check blood sugar  . pantoprazole (PROTONIX) 40 MG tablet Take 1 tablet (40 mg total) by mouth daily.  Marland Kitchen telmisartan-hydrochlorothiazide (MICARDIS HCT) 80-25 MG tablet Take 1 tablet by mouth daily.   No facility-administered encounter medications on file as of 09/18/2020.    Patient Active Problem List   Diagnosis Date Noted  . Chronic kidney disease, stage 3b ([place]) 09/04/2020  . Dysphagia   . History of colonic polyps   . Polyp of transverse colon   . Abnormal sense of taste 12/19/2019  . Loss of memory 12/19/2019  . Mood changes 12/19/2019  . Acanthosis nigricans 11/14/2014  . Benign hypertension 11/14/2014  . Dyslipidemia 11/14/2014  . Gastro-esophageal reflux disease  without esophagitis 11/14/2014  . Extreme obesity 11/14/2014  . Primary osteoarthritis of both knees 11/14/2014  . Microalbuminuria 10/15/2012  . Controlled type 2 diabetes mellitus with microalbuminuria ([place]) 06/05/2009  . Body dermatophytosis 12/13/2008    Conditions to be addressed/monitored: Dementia; Memory Deficits  Care Plan : General Social Work (Adult)  Updates made by Vern Claude, LCSW since 09/18/2020 12:00 AM    Problem: Caregiver Stress     Long-Range Goal: Caregiver Coping Optimized   Start Date: 06/15/2020  Expected End Date: 10/12/2020  This Visit's Progress: On track  Recent Progress: On track  Priority: Medium  Note:   Current Barriers:  . Level of care concerns, Memory Deficits, Inability to perform IADL's independently, and Lacks knowledge of community resource: related to appropriate Day Programs . Unable to perform IADLs independently  Clinical Social Work Clinical Goal(s):  Marland Kitchen Over the next 90 days, patient will work with SW to address concerns related to assistance with patient's socialization  Interventions: . 1:1 collaboration with Steele Sizer, MD regarding development and update of comprehensive plan of care as evidenced by provider attestation and co-signature . Inter-disciplinary care team collaboration (see longitudinal plan of care) . [name] continues to express concerns about patient's memory challenges -specifically her short term memory . Collaboration phone call to the [facility] to confirm that patient has a intake appointment on determine eligibility on Friday, 09/21/20 at 11 am . [name] confirmed intake appointment as well and is looking forward  to patient's participation in the [facility] . [name] continues to be provided with emotional support in regards to the progression of patient's condition and positive reinforcement for motivation to provide care . Will continue to follow up with the [facility] . Discussed plans with patient for ongoing care management follow up and provided patient with direct contact information for care management team  Patient Goals/Self-Care Activities Over the next 90 days, patient will:   - Patient will attend all scheduled provider appointments - begin a notebook of services in my neighborhood or community - follow-up on  any referrals for help I am given, specifically [facility] - think ahead to make sure my need does not become an emergency - make a list of family or friends that I can call              Follow Up Plan: SW will follow up with patient by phone over the next 14 business days      [place], [place] Worker  [facility] Care Management [phone]

## 2020-09-18 NOTE — Patient Instructions (Signed)
Visit Information  Goals Addressed            This Visit's Progress   . Find Help in My Community       Timeframe:  Long-Range Goal Priority:  Medium Start Date:    06/15/20                         Expected End Date:    11/23/20                   Follow Up Date 09/28/20   - begin a notebook of services in my neighborhood or community - continue to follow-up on any referrals for help I am given- I.e. [facility]-intake appointment scheduled for 09/21/20 - think ahead to make sure my need does not become an emergency - make a list of family or friends that I can call  - continue daily walks-15 minutes per day    Why is this important?    Knowing how and where to find help for yourself or family in your neighborhood and community is an important skill.   You will want to take some steps to learn how.    Notes:        The patient verbalized understanding of instructions, educational materials, and care plan provided today and declined offer to receive copy of patient instructions, educational materials, and care plan.   Telephone follow up appointment with care management team member scheduled for: 09/20/20  Elliot Gurney, [place] Worker  [facility] [phone]

## 2020-09-20 ENCOUNTER — Ambulatory Visit: Payer: Self-pay

## 2020-09-20 DIAGNOSIS — E1122 Type 2 diabetes mellitus with diabetic chronic kidney disease: Secondary | ICD-10-CM

## 2020-09-20 DIAGNOSIS — F09 Unspecified mental disorder due to known physiological condition: Secondary | ICD-10-CM

## 2020-09-20 NOTE — Chronic Care Management (AMB) (Signed)
Chronic Care Management   Follow Up Note   09/20/2020 Name: Yvonne Curtis MRN: [id number] DOB: 30-May-1950  Primary Care Provider: Steele Sizer, MD Reason for referral : Chronic Care Management   Yvonne Curtis is a 70 y.o. year old female who is a primary care patient of Steele Sizer, MD. A routine telephonic outreach was conducted today. She has met her nursing care management goals.  Review of Yvonne Curtis's status, including review of consultants reports, relevant labs and test results was conducted today. Collaboration with appropriate care team members was performed as part of the comprehensive evaluation and provision of chronic care management services.    SDOH (Social Determinants of Health) assessments performed: No     Outpatient Encounter Medications as of 09/20/2020  Medication Sig  . acetaminophen (TYLENOL) 500 MG tablet Take by mouth.  Marland Kitchen amLODipine (NORVASC) 10 MG tablet Take 1 tablet (10 mg total) by mouth daily.  Marland Kitchen atorvastatin (LIPITOR) 40 MG tablet Take 1 tablet (40 mg total) by mouth daily.  . benzonatate (TESSALON) 100 MG capsule Take 1-2 capsules (100-200 mg total) by mouth 2 (two) times daily as needed.  . blood glucose meter kit and supplies Dispense based on patient and insurance preference. Use once daily as directed. (FOR ICD-10 E10.9, E11.9). STrips 100 Lancets 100  . cetirizine (ZYRTEC) 10 MG tablet Take 1 tablet (10 mg total) by mouth daily as needed.  . Cholecalciferol (VITAMIN D3) 2000 UNITS capsule VITAMIN D3, 2000UNIT (Oral Capsule)  2 po qday for 0 days  Quantity: 60.00;  Refills: 0   Ordered :15-May-2010  Steele Sizer MD;  Buddy Duty 05-October-2008 Active  . citalopram (CELEXA) 10 MG tablet Take 1 tablet (10 mg total) by mouth daily.  . Lancets (ONETOUCH DELICA PLUS [id number]) MISC Use as directed to check blood sugar  . memantine (NAMENDA) 5 MG tablet Take 5 mg by mouth 2 (two) times daily.  . mirtazapine (REMERON) 7.5 MG tablet  Take 1 tablet by mouth at bedtime.  . montelukast (SINGULAIR) 10 MG tablet Take 1 tablet (10 mg total) by mouth at bedtime.  Glory Rosebush ULTRA test strip 1 each daily. Use as directed to check blood sugar  . pantoprazole (PROTONIX) 40 MG tablet Take 1 tablet (40 mg total) by mouth daily.  Marland Kitchen telmisartan-hydrochlorothiazide (MICARDIS HCT) 80-25 MG tablet Take 1 tablet by mouth daily.   No facility-administered encounter medications on file as of 09/20/2020.     Objective:  Patient Care Plan: Wellness (Adult)    Problem Identified: Cognitive Function (Wellness)     Long-Range Goal: Cognitive Function Enhanced Completed 09/20/2020  Start Date: 06/11/2020  Expected End Date: 10/09/2020  Recent Progress: On track  Priority: High  Note:   Current Barriers:  . Chronic Care Management support needs r/t cognitive function.  Clinical Goal(s):  Marland Kitchen Over the next 90 days, patient will not experience complications r/t decline in cognitive function.   Interventions:  . Evaluation of current treatment plan related to  self-management and patient's adherence to plan as established by provider. . Collaboration with Steele Sizer, MD regarding development and update of comprehensive plan of care as evidenced by provider attestation and co-signature. Bertram Savin care team collaboration (see longitudinal plan of care) . Reviewed current treatment plan and changes in cognition. Her spouse reports that she is doing very well. She is taking medications as prescribed. Reports that she is ambulating well and following recommended safety measures. Denies falls since our last outreach. She continues to respond  appropriately and perform ADL's independently. She remains engaged with the Neurology team. She is pending a visit with [facility] on tomorrow.    Patient Goals/Self Care Activities:  -Continue taking medications as prescribed. -Adhere to recommended fall prevention  measures -Update the care management team if in-home assistance is needed. -Attend follow up with Dr. Melrose Nakayama as scheduled.    Goal Met      Patient Care Plan: Hypertension (Adult)    Problem Identified: Hypertension (Hypertension)     Long-Range Goal: Hypertension Management Completed 09/20/2020  Start Date: 06/11/2020  Expected End Date: 10/09/2020  Note:   Objective:  Marland Kitchen Most recent eGFR/CrCl: No results found for: EGFR   BP Readings from Last 3 Encounters:  09/04/20 128/70  08/23/20 (!) 106/54  08/21/20 106/72    Current Barriers:   Chronic disease management needs and support r/t Hypertension self-management.  Case Manager Clinical Goal(s):   Over the next 120 days, patient will demonstrate adherence to prescribed treatment plan for hypertension as evidenced by taking all medications as prescribed, monitoring and recording blood pressure as directed, and adhering to a cardiac prudent diet.  Interventions:   Collaboration with Steele Sizer, MD regarding development and update of comprehensive plan of care as evidenced by provider attestation and co-signature  Inter-disciplinary care team collaboration (see longitudinal plan of care)  Reviewed medications. Spouse reports she is taking medications as prescribed and tolerating the regimen. Reports BP has been within range. Her appetite has improved. She is able to tolerate smalls meals and protein shakes. Reports she has not resumed exercise classes but remains active during the day. No chest pain, palpitations, dizziness or headaches.  Reviewed indications for notifying a provider. Reviewed worsening s/sx that require immediate medical attention.    Patient Goals/Self-Care Activities -Self administer medications as prescribed -Attend all scheduled provider appointments -Call provider office for new concerns or BP outside of established parameters -Monitor BP and record readings -Adhere to recommended cardiac prudent  diet   Goal Met        PLAN [name] has met her nursing care management goals. Her spouse agreed to call if her condition changes and additional outreach is required.  Will gladly re-engage.    [facility] [phone]

## 2020-09-21 ENCOUNTER — Other Ambulatory Visit: Payer: Self-pay | Admitting: Family Medicine

## 2020-09-21 ENCOUNTER — Telehealth: Payer: Self-pay

## 2020-09-21 DIAGNOSIS — Z111 Encounter for screening for respiratory tuberculosis: Secondary | ICD-10-CM

## 2020-09-21 NOTE — Telephone Encounter (Signed)
Pt is requesting a order for tb testing. She is trying to get into a program to keep her active during the day time. Also paperwork has been dropped off for Dr Ancil Boozer to fill out for the program

## 2020-09-24 DIAGNOSIS — Z111 Encounter for screening for respiratory tuberculosis: Secondary | ICD-10-CM | POA: Diagnosis not present

## 2020-09-24 NOTE — Patient Instructions (Addendum)
Thank you for allowing the Chronic Care Management team to participate in your care.   Goals Addressed: Patient Care Plan: Wellness (Adult)    Problem Identified: Cognitive Function (Wellness)     Long-Range Goal: Cognitive Function Enhanced Completed 09/20/2020  Start Date: 06/11/2020  Expected End Date: 10/09/2020  Recent Progress: On track  Priority: High  Note:   Current Barriers:  . Chronic Care Management support needs r/t cognitive function.  Clinical Goal(s):  Yvonne Curtis Kitchen Over the next 90 days, patient will not experience complications r/t decline in cognitive function.   Interventions:  . Evaluation of current treatment plan related to  self-management and patient's adherence to plan as established by provider. . Collaboration with Yvonne Sizer, MD regarding development and update of comprehensive plan of care as evidenced by provider attestation and co-signature. Yvonne Curtis care team collaboration (see longitudinal plan of care) . Reviewed current treatment plan and changes in cognition. Her spouse reports that she is doing very well. She is taking medications as prescribed. Reports that she is ambulating well and following recommended safety measures. Denies falls since our last outreach. She continues to respond appropriately and perform ADL's independently. She remains engaged with the Neurology team. She is pending a visit with [facility] on tomorrow.    Patient Goals/Self Care Activities:  -Continue taking medications as prescribed. -Adhere to recommended fall prevention measures -Update the care management team if in-home assistance is needed. -Attend follow up with Yvonne Curtis as scheduled.    Goal Met      Patient Care Plan: Hypertension (Adult)    Problem Identified: Hypertension (Hypertension)     Long-Range Goal: Hypertension Management Completed 09/20/2020  Start Date: 06/11/2020  Expected End Date: 10/09/2020  Note:   Objective:   Yvonne Curtis Kitchen Most recent eGFR/CrCl: No results found for: EGFR   BP Readings from Last 3 Encounters:  09/04/20 128/70  08/23/20 (!) 106/54  08/21/20 106/72    Current Barriers:   Chronic disease management needs and support r/t Hypertension self-management.  Case Manager Clinical Goal(s):   Over the next 120 days, patient will demonstrate adherence to prescribed treatment plan for hypertension as evidenced by taking all medications as prescribed, monitoring and recording blood pressure as directed, and adhering to a cardiac prudent diet.  Interventions:   Collaboration with Yvonne Sizer, MD regarding development and update of comprehensive plan of care as evidenced by provider attestation and co-signature  Inter-disciplinary care team collaboration (see longitudinal plan of care)  Reviewed medications. Spouse reports she is taking medications as prescribed and tolerating the regimen. Reports BP has been within range. Her appetite has improved. She is able to tolerate smalls meals and protein shakes. Reports she has not resumed exercise classes but remains active during the day. No chest pain, palpitations, dizziness or headaches.  Reviewed indications for notifying a provider. Reviewed worsening s/sx that require immediate medical attention.    Patient Goals/Self-Care Activities -Self administer medications as prescribed -Attend all scheduled provider appointments -Call provider office for new concerns or BP outside of established parameters -Monitor BP and record readings -Adhere to recommended cardiac prudent diet   Goal Met           Yvonne Curtis has met her nursing care management goals. Her spouse declined need for mailed/printed instructions. Agreed to call if her condition changes and additional outreach is required. Will gladly reengage.    [facility] [phone]

## 2020-09-26 LAB — QUANTIFERON-TB GOLD PLUS
Mitogen-NIL: >10 IU/mL
NIL: 0.05 IU/mL
QuantiFERON-TB Gold Plus: NEGATIVE
TB1-NIL: <0 IU/mL
TB2-NIL: <0 IU/mL

## 2020-09-26 NOTE — Progress Notes (Signed)
Completed.

## 2020-09-28 ENCOUNTER — Ambulatory Visit: Payer: HMO | Admitting: *Deleted

## 2020-09-28 DIAGNOSIS — R413 Other amnesia: Secondary | ICD-10-CM

## 2020-09-28 DIAGNOSIS — F418 Other specified anxiety disorders: Secondary | ICD-10-CM | POA: Diagnosis not present

## 2020-09-28 DIAGNOSIS — N1832 Chronic kidney disease, stage 3b: Secondary | ICD-10-CM | POA: Diagnosis not present

## 2020-09-28 DIAGNOSIS — E1122 Type 2 diabetes mellitus with diabetic chronic kidney disease: Secondary | ICD-10-CM | POA: Diagnosis not present

## 2020-09-28 DIAGNOSIS — I129 Hypertensive chronic kidney disease with stage 1 through stage 4 chronic kidney disease, or unspecified chronic kidney disease: Secondary | ICD-10-CM | POA: Diagnosis not present

## 2020-09-28 NOTE — Chronic Care Management (AMB) (Signed)
Chronic Care Management    Clinical Social Work Note  09/28/2020 Name: Yvonne Curtis MRN: [id number] DOB: March 19, 1951  Yvonne Curtis is a 70 y.o. year old female who is a primary care patient of Steele Sizer, MD. The CCM team was consulted to assist the patient with chronic disease management and/or care coordination needs related to: [employer] .   Engaged with patient's spouse by telephone for follow up visit in response to provider referral for social work chronic care management and care coordination services.   Consent to Services:  The patient was given information about Chronic Care Management services, agreed to services, and gave verbal consent prior to initiation of services.  Please see initial visit note for detailed documentation.   Patient agreed to services and consent obtained.   Assessment: Review of patient past medical history, allergies, medications, and health status, including review of relevant consultants reports was performed today as part of a comprehensive evaluation and provision of chronic care management and care coordination services.     SDOH (Social Determinants of Health) assessments and interventions performed:    Advanced Directives Status: Not addressed in this encounter.  CCM Care Plan  Allergies  Allergen Reactions  . Oxycodone Other (See Comments)  . Oxycodone Hcl     insomnia, agitation    Outpatient Encounter Medications as of 09/28/2020  Medication Sig  . acetaminophen (TYLENOL) 500 MG tablet Take by mouth.  Marland Kitchen amLODipine (NORVASC) 10 MG tablet Take 1 tablet (10 mg total) by mouth daily.  Marland Kitchen atorvastatin (LIPITOR) 40 MG tablet Take 1 tablet (40 mg total) by mouth daily.  . benzonatate (TESSALON) 100 MG capsule Take 1-2 capsules (100-200 mg total) by mouth 2 (two) times daily as needed.  . blood glucose meter kit and supplies Dispense based on patient and insurance preference. Use once daily as directed. (FOR ICD-10  E10.9, E11.9). STrips 100 Lancets 100  . cetirizine (ZYRTEC) 10 MG tablet Take 1 tablet (10 mg total) by mouth daily as needed.  . Cholecalciferol (VITAMIN D3) 2000 UNITS capsule VITAMIN D3, 2000UNIT (Oral Capsule)  2 po qday for 0 days  Quantity: 60.00;  Refills: 0   Ordered :15-May-2010  Steele Sizer MD;  Buddy Duty 05-October-2008 Active  . citalopram (CELEXA) 10 MG tablet Take 1 tablet (10 mg total) by mouth daily.  . Lancets (ONETOUCH DELICA PLUS [id number]) MISC Use as directed to check blood sugar  . memantine (NAMENDA) 5 MG tablet Take 5 mg by mouth 2 (two) times daily.  . mirtazapine (REMERON) 7.5 MG tablet Take 1 tablet by mouth at bedtime.  . montelukast (SINGULAIR) 10 MG tablet Take 1 tablet (10 mg total) by mouth at bedtime.  Glory Rosebush ULTRA test strip 1 each daily. Use as directed to check blood sugar  . pantoprazole (PROTONIX) 40 MG tablet Take 1 tablet (40 mg total) by mouth daily.  Marland Kitchen telmisartan-hydrochlorothiazide (MICARDIS HCT) 80-25 MG tablet Take 1 tablet by mouth daily.   No facility-administered encounter medications on file as of 09/28/2020.    Patient Active Problem List   Diagnosis Date Noted  . Chronic kidney disease, stage 3b ([place]) 09/04/2020  . Dysphagia   . History of colonic polyps   . Polyp of transverse colon   . Abnormal sense of taste 12/19/2019  . Loss of memory 12/19/2019  . Mood changes 12/19/2019  . Acanthosis nigricans 11/14/2014  . Benign hypertension 11/14/2014  . Dyslipidemia 11/14/2014  . Gastro-esophageal reflux disease without esophagitis 11/14/2014  .  Extreme obesity 11/14/2014  . Primary osteoarthritis of both knees 11/14/2014  . Microalbuminuria 10/15/2012  . Controlled type 2 diabetes mellitus with microalbuminuria ([place]) 06/05/2009  . Body dermatophytosis 12/13/2008    Conditions to be addressed/monitored: Dementia; Memory Deficits  Care Plan : General Social Work (Adult)  Updates made by Vern Claude, LCSW since 09/28/2020  12:00 AM    Problem: Caregiver Stress     Long-Range Goal: Caregiver Coping Optimized   Start Date: 06/15/2020  Expected End Date: 10/12/2020  This Visit's Progress: On track  Recent Progress: On track  Priority: Medium  Note:   Current Barriers:  . Level of care concerns, Memory Deficits, Inability to perform IADL's independently, and Lacks knowledge of community resource: related to appropriate Day Programs . Unable to perform IADLs independently  Clinical Social Work Clinical Goal(s):  Marland Kitchen Over the next 90 days, patient will work with SW to address concerns related to assistance with patient's socialization  Interventions: . 1:1 collaboration with Steele Sizer, MD regarding development and update of comprehensive plan of care as evidenced by provider attestation and co-signature . Inter-disciplinary care team collaboration (see longitudinal plan of care) . [name] continues to express concerns about patient's memory challenges -specifically her short term memory . Collaboration phone call to the [facility] to confirm that patient had the intake appointment on Friday, 09/21/20 at 11 am . [name] confirmed intake appointment went well and is looking forward  to patient's participation in the [facility] Day Program . TB test completed as well as intake paperwork for a start date  in the next couple of weeks. Per patient's spouse, patient will be attending at least 3 days per week to start . [name] continues to be provided with emotional support in regards to the progression of patient's condition and positive reinforcement for motivation to provide care . Will continue to follow up with the [facility] . Discussed plans with patient for ongoing care management follow up and provided patient with direct contact information for care management team  Patient Goals/Self-Care Activities Over the next 90 days, patient will:   - Patient will  attend all scheduled provider appointments - begin a notebook of services in my neighborhood or community - follow-up on any referrals for help I am given, specifically [facility] - think ahead to make sure my need does not become an emergency - make a list of family or friends that I can call              Follow Up Plan: SW will follow up with patient by phone over the next 14 business days      [place], [place] Worker  [facility] Care Management [phone]

## 2020-09-28 NOTE — Patient Instructions (Addendum)
  Visit Information  Goals Addressed            This Visit's Progress   . Find Help in My Community       Timeframe:  Long-Range Goal Priority:  Medium Start Date:    06/15/20                         Expected End Date:    11/23/20                   Follow Up Date 10/12/20   - begin a notebook of services in my neighborhood or community - continue to follow-up on any referrals for help I am given- I.e. [facility] - think ahead to make sure my need does not become an emergency - make a list of family or friends that I can call  - continue daily walks-15 minutes per day    Why is this important?    Knowing how and where to find help for yourself or family in your neighborhood and community is an important skill.   You will want to take some steps to learn how.    Notes:        The patient verbalized understanding of instructions, educational materials, and care plan provided today and declined offer to receive copy of patient instructions, educational materials, and care plan.   Telephone follow up appointment with care management team member scheduled for:  10/12/20 Elliot Gurney, [place] Worker  [facility] Care Management [phone]

## 2020-10-09 ENCOUNTER — Other Ambulatory Visit: Payer: Self-pay

## 2020-10-09 DIAGNOSIS — F418 Other specified anxiety disorders: Secondary | ICD-10-CM

## 2020-10-09 DIAGNOSIS — E0590 Thyrotoxicosis, unspecified without thyrotoxic crisis or storm: Secondary | ICD-10-CM | POA: Diagnosis not present

## 2020-10-09 MED ORDER — CITALOPRAM HYDROBROMIDE 10 MG PO TABS: 10.0000 mg | ORAL_TABLET | Freq: Every day | ORAL | 0 refills | Status: DC

## 2020-10-09 NOTE — Telephone Encounter (Signed)
Has appt 7/14

## 2020-10-11 DIAGNOSIS — I10 Essential (primary) hypertension: Secondary | ICD-10-CM | POA: Diagnosis not present

## 2020-10-11 DIAGNOSIS — N1832 Chronic kidney disease, stage 3b: Secondary | ICD-10-CM | POA: Diagnosis not present

## 2020-10-11 DIAGNOSIS — E785 Hyperlipidemia, unspecified: Secondary | ICD-10-CM | POA: Diagnosis not present

## 2020-10-11 DIAGNOSIS — E1122 Type 2 diabetes mellitus with diabetic chronic kidney disease: Secondary | ICD-10-CM | POA: Diagnosis not present

## 2020-10-11 DIAGNOSIS — R809 Proteinuria, unspecified: Secondary | ICD-10-CM | POA: Diagnosis not present

## 2020-10-11 DIAGNOSIS — F0391 Unspecified dementia with behavioral disturbance: Secondary | ICD-10-CM | POA: Diagnosis not present

## 2020-10-12 ENCOUNTER — Ambulatory Visit (INDEPENDENT_AMBULATORY_CARE_PROVIDER_SITE_OTHER): Payer: HMO | Admitting: *Deleted

## 2020-10-12 DIAGNOSIS — F418 Other specified anxiety disorders: Secondary | ICD-10-CM | POA: Diagnosis not present

## 2020-10-12 DIAGNOSIS — R413 Other amnesia: Secondary | ICD-10-CM

## 2020-10-12 NOTE — Patient Instructions (Signed)
  Visit Information   Goals Addressed             This Visit's Progress    Find Help in My Community       Timeframe:  Long-Range Goal Priority:  Medium Start Date:    06/15/20                         Expected End Date: 10/12/20                 Follow Up Date 10/12/20   - begin a notebook of services in my neighborhood or community - continue to follow-up with the [facility] - think ahead to make sure my need does not become an emergency - make a list of family or friends that I can call  - continue daily walks-15 minutes per day    Why is this important?   Knowing how and where to find help for yourself or family in your neighborhood and community is an important skill.  You will want to take some steps to learn how.    Notes:          The patient verbalized understanding of instructions, educational materials, and care plan provided today and declined offer to receive copy of patient instructions, educational materials, and care plan.   No further follow up required: Patient to continue with the [place], [place] Worker  [facility] Care Management [phone]

## 2020-10-12 NOTE — Chronic Care Management (AMB) (Signed)
Chronic Care Management    Clinical Social Work Note  10/12/2020 Name: Yvonne Curtis MRN: [id number] DOB: Apr 05, 1951  Yvonne Curtis is a 70 y.o. year old female who is a primary care patient of Steele Sizer, MD. The CCM team was consulted to assist the patient with chronic disease management and/or care coordination needs related to: [employer] .   Engaged with patient's spouse  for follow up visit in response to provider referral for social work chronic care management and care coordination services.   Consent to Services:  The patient was given information about Chronic Care Management services, agreed to services, and gave verbal consent prior to initiation of services.  Please see initial visit note for detailed documentation.   Patient agreed to services and consent obtained.   Assessment: Review of patient past medical history, allergies, medications, and health status, including review of relevant consultants reports was performed today as part of a comprehensive evaluation and provision of chronic care management and care coordination services.     SDOH (Social Determinants of Health) assessments and interventions performed:    Advanced Directives Status: Not addressed in this encounter.  CCM Care Plan  Allergies  Allergen Reactions   Oxycodone Other (See Comments)   Oxycodone Hcl     insomnia, agitation    Outpatient Encounter Medications as of 10/12/2020  Medication Sig   acetaminophen (TYLENOL) 500 MG tablet Take by mouth.   amLODipine (NORVASC) 10 MG tablet Take 1 tablet (10 mg total) by mouth daily.   atorvastatin (LIPITOR) 40 MG tablet Take 1 tablet (40 mg total) by mouth daily.   benzonatate (TESSALON) 100 MG capsule Take 1-2 capsules (100-200 mg total) by mouth 2 (two) times daily as needed.   blood glucose meter kit and supplies Dispense based on patient and insurance preference. Use once daily as directed. (FOR ICD-10 E10.9, E11.9). STrips  100 Lancets 100   cetirizine (ZYRTEC) 10 MG tablet Take 1 tablet (10 mg total) by mouth daily as needed.   Cholecalciferol (VITAMIN D3) 2000 UNITS capsule VITAMIN D3, 2000UNIT (Oral Capsule)  2 po qday for 0 days  Quantity: 60.00;  Refills: 0   Ordered :15-May-2010  Steele Sizer MD;  Buddy Duty 05-October-2008 Active   citalopram (CELEXA) 10 MG tablet Take 1 tablet (10 mg total) by mouth daily.   Lancets (ONETOUCH DELICA PLUS [id number]) MISC Use as directed to check blood sugar   memantine (NAMENDA) 5 MG tablet Take 5 mg by mouth 2 (two) times daily.   mirtazapine (REMERON) 7.5 MG tablet Take 1 tablet by mouth at bedtime.   montelukast (SINGULAIR) 10 MG tablet Take 1 tablet (10 mg total) by mouth at bedtime.   ONETOUCH ULTRA test strip 1 each daily. Use as directed to check blood sugar   pantoprazole (PROTONIX) 40 MG tablet Take 1 tablet (40 mg total) by mouth daily.   telmisartan-hydrochlorothiazide (MICARDIS HCT) 80-25 MG tablet Take 1 tablet by mouth daily.   No facility-administered encounter medications on file as of 10/12/2020.    Patient Active Problem List   Diagnosis Date Noted   Chronic kidney disease, stage 3b ([place]) 09/04/2020   Dysphagia    History of colonic polyps    Polyp of transverse colon    Abnormal sense of taste 12/19/2019   Loss of memory 12/19/2019   Mood changes 12/19/2019   Acanthosis nigricans 11/14/2014   Benign hypertension 11/14/2014   Dyslipidemia 11/14/2014   Gastro-esophageal reflux disease without esophagitis 11/14/2014   Extreme  obesity 11/14/2014   Primary osteoarthritis of both knees 11/14/2014   Microalbuminuria 10/15/2012   Controlled type 2 diabetes mellitus with microalbuminuria ([place]) 06/05/2009   Body dermatophytosis 12/13/2008    Conditions to be addressed/monitored: Dementia; Memory Deficits  Care Plan : General Social Work (Adult)  Updates made by Vern Claude, LCSW since 10/12/2020 12:00 AM     Problem: Caregiver Stress       Long-Range Goal: Caregiver Coping Optimized   Start Date: 06/15/2020  Expected End Date: 10/12/2020  This Visit's Progress: On track  Recent Progress: On track  Priority: Medium  Note:   Current Barriers:  Level of care concerns, Memory Deficits, Inability to perform IADL's independently, and Lacks knowledge of community resource: related to appropriate Day Programs Unable to perform IADLs independently  Clinical Social Work Clinical Goal(s):  Over the next 90 days, patient will work with SW to address concerns related to assistance with patient's socialization  Interventions: 1:1 collaboration with Steele Sizer, MD regarding development and update of comprehensive plan of care as evidenced by provider attestation and co-signature Inter-disciplinary care team collaboration (see longitudinal plan of care) [name] continues to express concerns about patient's memory challenges -specifically her short term memory Confirmed that patient has started the [facility] on Tuesday, 10/09/20 and has found it very beneficial for patient-she will attend 4 days per week to start [name] continues to be provided with emotional support in regards to the progression of patient's condition and positive reinforcement for motivation to provide care Patient will continue with the [facility] Discussed plans with patient's spouse for ongoing care management follow up and provided patient with direct contact information for care management team if needed in the future  Patient Goals/Self-Care Activities Over the next 90 days, patient will:   - Patient will attend all scheduled provider appointments - begin a notebook of services in my neighborhood or community - Continue participation with the [facility] - think ahead to make sure my need does not become an emergency - make a list of family or friends that I can call               Follow Up Plan:  Client's spouse will call this social worker with any additional community resource needs      [place], [place] Worker  [facility] Care Management [phone]

## 2020-11-11 DIAGNOSIS — Z20822 Contact with and (suspected) exposure to covid-19: Secondary | ICD-10-CM | POA: Diagnosis not present

## 2020-11-11 DIAGNOSIS — U071 COVID-19: Secondary | ICD-10-CM | POA: Diagnosis not present

## 2020-11-15 ENCOUNTER — Ambulatory Visit: Payer: HMO | Admitting: Family Medicine

## 2020-11-22 DIAGNOSIS — R413 Other amnesia: Secondary | ICD-10-CM | POA: Diagnosis not present

## 2020-11-22 DIAGNOSIS — R4586 Emotional lability: Secondary | ICD-10-CM | POA: Diagnosis not present

## 2020-11-22 DIAGNOSIS — R432 Parageusia: Secondary | ICD-10-CM | POA: Diagnosis not present

## 2020-12-12 DIAGNOSIS — E119 Type 2 diabetes mellitus without complications: Secondary | ICD-10-CM | POA: Diagnosis not present

## 2020-12-12 LAB — HM DIABETES EYE EXAM

## 2020-12-13 NOTE — Progress Notes (Deleted)
Name: Yvonne Curtis   MRN: [id number]    DOB: 1950/09/20   Date:12/13/2020       Progress Note  Subjective  Chief Complaint  Follow Up  HPI  DMII with renal manifestation: she has been taking Metformin daily and denies side effects, including no diarrhea, she denies hypoglycemic episode. Her hgbA1C was 5.7% ,  6.1 % today it is down to   She denies polyphagia, polyuria or polydipsia. Last urine micro negative but she has a history of microalbuminuria that resolved with ARB. She is off Metformin and has gained a little weight. We will recheck A1C next visit   Depression: husband states she seems to be doing better, taking medication as prescribed  Morbid obesity/Malnutrition: she is still obese, but had lost 37 lbs in the past year, since her last visit with me 6 weeks ago , appetite has improved, she still has taste perversion and is waiting to go to [facility] for second opinion. She is taking PPI, EGD showed hiatal hernia, but no ulcers. Dysphagia has resolved  Seasonal allergies: some rhinorrhea, post-nasal drainage and a cough that is worse at night. Husband states zyrtec not covered by insurance. We will try singulair to see if it helps, also explain he can get Zyrtec of Loratadine otc   CKI stage III: seen by Dr. Lanora Manis - nephrologist recently, BP is at goal, per his note most likely due to hypertensive nephrosclerosis and DM, she denies pruritis , she has good urine output   Patient Active Problem List   Diagnosis Date Noted   Chronic kidney disease, stage 3b ([place]) 09/04/2020   Dysphagia    History of colonic polyps    Polyp of transverse colon    Abnormal sense of taste 12/19/2019   Loss of memory 12/19/2019   Mood changes 12/19/2019   Acanthosis nigricans 11/14/2014   Benign hypertension 11/14/2014   Dyslipidemia 11/14/2014   Gastro-esophageal reflux disease without esophagitis 11/14/2014   Extreme obesity 11/14/2014   Primary osteoarthritis of both knees 11/14/2014    Microalbuminuria 10/15/2012   Controlled type 2 diabetes mellitus with microalbuminuria ([place]) 06/05/2009   Body dermatophytosis 12/13/2008    Past Surgical History:  Procedure Laterality Date   CHOLECYSTECTOMY     COLONOSCOPY WITH PROPOFOL N/A 11/30/2014   Procedure: COLONOSCOPY WITH PROPOFOL;  Surgeon: Lucilla Lame, MD;  Location: [place];  Service: Endoscopy;  Laterality: N/A;  WITH BIOPSY-- TRANSVERSE COLON POLYP  X 2 DESCENING COLON POLYP   COLONOSCOPY WITH PROPOFOL N/A 01/17/2020   Procedure: COLONOSCOPY WITH PROPOFOL;  Surgeon: Lucilla Lame, MD;  Location: [facility] ENDOSCOPY;  Service: Endoscopy;  Laterality: N/A;   ESOPHAGOGASTRODUODENOSCOPY (EGD) WITH PROPOFOL N/A 08/23/2020   Procedure: ESOPHAGOGASTRODUODENOSCOPY (EGD) WITH PROPOFOL;  Surgeon: Lucilla Lame, MD;  Location: [facility] ENDOSCOPY;  Service: Endoscopy;  Laterality: N/A;   GALLBLADDER SURGERY     SEPTOPLASTY     tonsillectomy     TONSILLECTOMY     TUBAL LIGATION      Family History  Problem Relation Age of Onset   Colon cancer Mother    Lung cancer Father    Hypertension Sister    Hypertension Sister    Breast cancer Neg Hx     Social History   Tobacco Use   Smoking status: Never   Smokeless tobacco: Never  Substance Use Topics   Alcohol use: No    Alcohol/week: 0.0 standard drinks     Current Outpatient Medications:    acetaminophen (TYLENOL) 500 MG tablet,  Take by mouth., Disp: , Rfl:    amLODipine (NORVASC) 10 MG tablet, Take 1 tablet (10 mg total) by mouth daily., Disp: 90 tablet, Rfl: 1   atorvastatin (LIPITOR) 40 MG tablet, Take 1 tablet (40 mg total) by mouth daily., Disp: 90 tablet, Rfl: 1   benzonatate (TESSALON) 100 MG capsule, Take 1-2 capsules (100-200 mg total) by mouth 2 (two) times daily as needed., Disp: 40 capsule, Rfl: 0   blood glucose meter kit and supplies, Dispense based on patient and insurance preference. Use once daily as directed. (FOR ICD-10 E10.9, E11.9). STrips 100 Lancets  100, Disp: 1 each, Rfl: 0   cetirizine (ZYRTEC) 10 MG tablet, Take 1 tablet (10 mg total) by mouth daily as needed., Disp: 90 tablet, Rfl: 1   Cholecalciferol (VITAMIN D3) 2000 UNITS capsule, VITAMIN D3, 2000UNIT (Oral Capsule)  2 po qday for 0 days  Quantity: 60.00;  Refills: 0   Ordered :15-May-2010  Steele Sizer MD;  Buddy Duty 05-October-2008 Active, Disp: , Rfl:    citalopram (CELEXA) 10 MG tablet, Take 1 tablet (10 mg total) by mouth daily., Disp: 90 tablet, Rfl: 0   Lancets (ONETOUCH DELICA PLUS [id number]) MISC, Use as directed to check blood sugar, Disp: , Rfl:    memantine (NAMENDA) 5 MG tablet, Take 5 mg by mouth 2 (two) times daily., Disp: , Rfl:    mirtazapine (REMERON) 7.5 MG tablet, Take 1 tablet by mouth at bedtime., Disp: , Rfl:    montelukast (SINGULAIR) 10 MG tablet, Take 1 tablet (10 mg total) by mouth at bedtime., Disp: 90 tablet, Rfl: 1   ONETOUCH ULTRA test strip, 1 each daily. Use as directed to check blood sugar, Disp: , Rfl:    pantoprazole (PROTONIX) 40 MG tablet, Take 1 tablet (40 mg total) by mouth daily., Disp: 30 tablet, Rfl: 11   telmisartan-hydrochlorothiazide (MICARDIS HCT) 80-25 MG tablet, Take 1 tablet by mouth daily., Disp: 90 tablet, Rfl: 1  Allergies  Allergen Reactions   Oxycodone Other (See Comments)   Oxycodone Hcl     insomnia, agitation    I personally reviewed {Reviewed:14835} with the patient/caregiver today.   ROS  ***  Objective  There were no vitals filed for this visit.  There is no height or weight on file to calculate BMI.  Physical Exam ***  Recent Results (from the past 2160 hour(s))  QuantiFERON-TB Gold Plus     Status: None   Collection Time: 09/24/20 10:40 AM  Result Value Ref Range   QuantiFERON-TB Gold Plus NEGATIVE NEGATIVE    Comment: Negative test result. M. tuberculosis complex  infection unlikely.    NIL 0.05 IU/mL   Mitogen-NIL >10.00 IU/mL   TB1-NIL <0.00 IU/mL   TB2-NIL <0.00 IU/mL    Comment: . The Nil tube  value reflects the background interferon gamma immune response of the patient's blood sample. This value has been subtracted from the patient's displayed TB and Mitogen results. . Lower than expected results with the Mitogen tube prevent false-negative Quantiferon readings by detecting a patient with a potential immune suppressive condition and/or suboptimal pre-analytical specimen handling. . The TB1 Antigen tube is coated with the M. tuberculosis-specific antigens designed to elicit responses from TB antigen primed CD4+ helper T-lymphocytes. . The TB2 Antigen tube is coated with the M. tuberculosis-specific antigens designed to elicit responses from TB antigen primed CD4+ helper and CD8+ cytotoxic T-lymphocytes. . For additional information, please refer to https://education.questdiagnostics.com/faq/FAQ204 (This link is being provided for informational/ educational purposes only.) .  HM DIABETES EYE EXAM     Status: None   Collection Time: 12/12/20 12:00 AM  Result Value Ref Range   HM Diabetic Eye Exam No Retinopathy No Retinopathy    Diabetic Foot Exam: Diabetic Foot Exam - Simple   No data filed    ***  PHQ2/9: Depression screen [facility] 2/9 09/04/2020 07/24/2020 06/11/2020 06/01/2020 04/17/2020  Decreased Interest 2 2 1 3 3   Down, Depressed, Hopeless 2 0 0 0 0  PHQ - 2 Score 4 2 1 3 3   Altered sleeping 0 0 - 3 0  Tired, decreased energy 0 0 - 3 1  Change in appetite 0 3 - 3 2  Feeling bad or failure about yourself  0 0 - 0 0  Trouble concentrating 0 0 - 3 2  Moving slowly or fidgety/restless 0 0 - 3 0  Suicidal thoughts 0 0 - 0 0  PHQ-9 Score 4 5 - 18 8  Difficult doing work/chores - - - Very difficult Very difficult  Some recent data might be hidden    phq 9 is {gen pos FUX:[id number]} ***  Fall Risk: Fall Risk  09/04/2020 07/24/2020 07/04/2020 06/01/2020 04/17/2020  Falls in the past year? 1 1 1 1 1   Number falls in past yr: 1 1 1 1 1   Injury with Fall? 0 0 0 0 0   Risk for fall due to : - - History of fall(s);Other (Comment) History of fall(s) History of fall(s)  Risk for fall due to: Comment - - Reports recent fall r/t decreased intake - -  Follow up - - Falls prevention discussed - Falls prevention discussed   ***   Functional Status Survey:   ***   Assessment & Plan  *** There are no diagnoses linked to this encounter.

## 2020-12-17 ENCOUNTER — Ambulatory Visit: Payer: HMO | Admitting: Family Medicine

## 2020-12-17 DIAGNOSIS — E1122 Type 2 diabetes mellitus with diabetic chronic kidney disease: Secondary | ICD-10-CM

## 2020-12-18 NOTE — Progress Notes (Signed)
Name: Yvonne Curtis   MRN: [id number]    DOB: 1950/05/19   Date:12/19/2020       Progress Note  Subjective  Chief Complaint  Follow Up  HPI  DMII with renal manifestation: she has been off Metformin and  A1C  has been stable today it was 5.6 % She denies polyphagia, polyuria or polydipsia. Last urine micro negative but she has a history of microalbuminuria that resolved with ARB. She states abnormal sense of taste resolved and has been eating better   Depression: husband states she seems to be doing better, tshe was on Citalopram but now on Remeron. Sister asked for lorazepam because she gets more confused in the evenings, family is able to calm her down  Morbid obesity/Malnutrition: BMI above 35 with co-morbidities such as DM, HTN , CKI.She had lost 37 lbs in the past year, but weight is stable now, gained 2 lbs since last visit   Seasonal allergies:seasonal symptoms doing well at thsi time   CKI stage III: seen by Dr. Lanora Manis - nephrologist recently, BP is at goal, per his note most likely due to hypertensive nephrosclerosis and DM, she denies pruritis , she has good urine output Last GFR was 39, reminded husband and patient not to take NSAID's  Memory loss/abnormal tate and change in behavior: seen by Dr. Melrose Nakayama neurologist, had labs and  MRI brain, and is taking Namemda , ARicept and Remeron, off Citalopram , family is still concerned and we placed referral to [facility], she will see Dr. Tonia Ghent 01/16/21 at the [facility]  Patient Active Problem List   Diagnosis Date Noted   Hypercalcemia 10/11/2020   Chronic kidney disease, stage 3b ([place]) 09/04/2020   Dysphagia    History of colonic polyps    Polyp of transverse colon    Abnormal sense of taste 12/19/2019   Loss of memory 12/19/2019   Mood changes 12/19/2019   Acanthosis nigricans 11/14/2014   Benign hypertension 11/14/2014   Dyslipidemia 11/14/2014   Gastro-esophageal reflux disease without  esophagitis 11/14/2014   Extreme obesity 11/14/2014   Primary osteoarthritis of both knees 11/14/2014   Microalbuminuria 10/15/2012   Controlled type 2 diabetes mellitus with microalbuminuria ([place]) 06/05/2009   Body dermatophytosis 12/13/2008    Past Surgical History:  Procedure Laterality Date   CHOLECYSTECTOMY     COLONOSCOPY WITH PROPOFOL N/A 11/30/2014   Procedure: COLONOSCOPY WITH PROPOFOL;  Surgeon: Lucilla Lame, MD;  Location: [place];  Service: Endoscopy;  Laterality: N/A;  WITH BIOPSY-- TRANSVERSE COLON POLYP  X 2 DESCENING COLON POLYP   COLONOSCOPY WITH PROPOFOL N/A 01/17/2020   Procedure: COLONOSCOPY WITH PROPOFOL;  Surgeon: Lucilla Lame, MD;  Location[facility]COPY;  Service: Endoscopy;  Laterality: N/A;   ESOPHAGOGASTRODUODENOSCOPY (EGD) WITH PROPOFOL N/A 08/23/2020   Procedure: ESOPHAGOGASTRODUODENOSCOPY (EGD) WITH PROPOFOL;  Surgeon: Lucilla Lame, MD;  Location: [facility] ENDOSCOPY;  Service: Endoscopy;  Laterality: N/A;   GALLBLADDER SURGERY     SEPTOPLASTY     tonsillectomy     TONSILLECTOMY     TUBAL LIGATION      Family History  Problem Relation Age of Onset   Colon cancer Mother    Lung cancer Father    Hypertension Sister    Hypertension Sister    Breast cancer Neg Hx     Social History   Tobacco Use   Smoking status: Never   Smokeless tobacco: Never  Substance Use Topics   Alcohol use: No    Alcohol/week:  0.0 standard drinks     Current Outpatient Medications:    acetaminophen (TYLENOL) 500 MG tablet, Take by mouth., Disp: , Rfl:    amLODipine (NORVASC) 10 MG tablet, Take 1 tablet (10 mg total) by mouth daily., Disp: 90 tablet, Rfl: 1   atorvastatin (LIPITOR) 40 MG tablet, Take 1 tablet (40 mg total) by mouth daily., Disp: 90 tablet, Rfl: 1   blood glucose meter kit and supplies, Dispense based on patient and insurance preference. Use once daily as directed. (FOR ICD-10 E10.9, E11.9). STrips 100 Lancets 100, Disp: 1 each, Rfl: 0    cetirizine (ZYRTEC) 10 MG tablet, Take 1 tablet (10 mg total) by mouth daily as needed., Disp: 90 tablet, Rfl: 1   Cholecalciferol (VITAMIN D3) 2000 UNITS capsule, VITAMIN D3, 2000UNIT (Oral Capsule)  2 po qday for 0 days  Quantity: 60.00;  Refills: 0   Ordered :15-May-2010  Steele Sizer MD;  Started 05-October-2008 Active, Disp: , Rfl:    donepezil (ARICEPT) 10 MG tablet, Take by mouth., Disp: , Rfl:    Lancets (ONETOUCH DELICA PLUS [id number]) MISC, Use as directed to check blood sugar, Disp: , Rfl:    memantine (NAMENDA) 5 MG tablet, Take 1 tablet by mouth 2 (two) times daily., Disp: , Rfl:    mirtazapine (REMERON) 7.5 MG tablet, Take 1 tablet by mouth at bedtime., Disp: , Rfl:    montelukast (SINGULAIR) 10 MG tablet, Take 1 tablet (10 mg total) by mouth at bedtime., Disp: 90 tablet, Rfl: 1   ONETOUCH ULTRA test strip, 1 each daily. Use as directed to check blood sugar, Disp: , Rfl:    pantoprazole (PROTONIX) 40 MG tablet, Take 1 tablet (40 mg total) by mouth daily., Disp: 30 tablet, Rfl: 11   telmisartan-hydrochlorothiazide (MICARDIS HCT) 80-25 MG tablet, Take 1 tablet by mouth daily., Disp: 90 tablet, Rfl: 1  Allergies  Allergen Reactions   Oxycodone Other (See Comments)   Oxycodone Hcl     insomnia, agitation    I personally reviewed active problem list, medication list, allergies, family history, social history with the patient/caregiver today.   ROS  Constitutional: Negative for fever or weight change.  Respiratory: Negative for cough and shortness of breath.   Cardiovascular: Negative for chest pain or palpitations.  Gastrointestinal: Negative for abdominal pain, no bowel changes.  Musculoskeletal: Negative for gait problem or joint swelling.  Skin: Negative for rash.  Neurological: Negative for dizziness or headache.  No other specific complaints in a complete review of systems (except as listed in HPI above).   Objective  Vitals:   12/19/20 1355  BP: 120/70  Pulse: 90   Resp: 16  Temp: 98 F (36.7 C)  SpO2: 97%  Weight: 188 lb (85.3 kg)  Height: 5' (1.524 m)    Body mass index is 36.72 kg/m.  Physical Exam  Constitutional: Patient appears well-developed and well-nourished. Obese  No distress.  HEENT: head atraumatic, normocephalic, pupils equal and reactive to light, neck supple Cardiovascular: Normal rate, regular rhythm and normal heart sounds.  No murmur heard. No BLE edema. Pulmonary/Chest: Effort normal and breath sounds normal. No respiratory distress. Abdominal: Soft.  There is no tenderness. Psychiatric: Patient has a normal mood and affect. behavior is normal. Smiling and cooperative   Recent Results (from the past 2160 hour(s))  QuantiFERON-TB Gold Plus     Status: None   Collection Time: 09/24/20 10:40 AM  Result Value Ref Range   QuantiFERON-TB Gold Plus NEGATIVE NEGATIVE    Comment:  Negative test result. M. tuberculosis complex  infection unlikely.    NIL 0.05 IU/mL   Mitogen-NIL >10.00 IU/mL   TB1-NIL <0.00 IU/mL   TB2-NIL <0.00 IU/mL    Comment: . The Nil tube value reflects the background interferon gamma immune response of the patient's blood sample. This value has been subtracted from the patient's displayed TB and Mitogen results. . Lower than expected results with the Mitogen tube prevent false-negative Quantiferon readings by detecting a patient with a potential immune suppressive condition and/or suboptimal pre-analytical specimen handling. . The TB1 Antigen tube is coated with the M. tuberculosis-specific antigens designed to elicit responses from TB antigen primed CD4+ helper T-lymphocytes. . The TB2 Antigen tube is coated with the M. tuberculosis-specific antigens designed to elicit responses from TB antigen primed CD4+ helper and CD8+ cytotoxic T-lymphocytes. . For additional information, please refer to https://education.questdiagnostics.com/faq/FAQ204 (This link is being provided for  informational/ educational purposes only.) .   HM DIABETES EYE EXAM     Status: None   Collection Time: 12/12/20 12:00 AM  Result Value Ref Range   HM Diabetic Eye Exam No Retinopathy No Retinopathy  POCT HgB A1C     Status: None   Collection Time: 12/19/20  2:06 PM  Result Value Ref Range   Hemoglobin A1C 5.6 4.0 - 5.6 %   HbA1c POC (<> result, manual entry)     HbA1c, POC (prediabetic range)     HbA1c, POC (controlled diabetic range)        PHQ2/9: Depression screen [facility] 2/9 12/19/2020 09/04/2020 07/24/2020 06/11/2020 06/01/2020  Decreased Interest _0 Down, Depressed, Hopeless 1 2 0 0 0  PHQ - 2 Score _1 Altered sleeping 0 0 0 - 3  Tired, decreased energy 0 0 0 - 3  Change in appetite 0 0 3 - 3  Feeling bad or failure about yourself  0 0 0 - 0  Trouble concentrating 0 0 0 - 3  Moving slowly or fidgety/restless 0 0 0 - 3  Suicidal thoughts 0 0 0 - 0  PHQ-9 Score _2 - 18  Difficult doing work/chores - - - - Very difficult  Some recent data might be hidden    phq 9 is positive  Fall Risk: Fall Risk  12/19/2020 09/04/2020 07/24/2020 07/04/2020 06/01/2020  Falls in the past year? _3 Number falls in past yr: _4 Injury with Fall? 0 0 0 0 0  Risk for fall due to : - - - History of fall(s);Other (Comment) History of fall(s)  Risk for fall due to: Comment - - - Reports recent fall r/t decreased intake -  Follow up - - - Falls prevention discussed -      Assessment & Plan  1. Controlled type 2 diabetes mellitus with stage 2 chronic kidney disease, without long-term current use of insulin (HCC)  - POCT HgB A1C  2. Hypertension associated with stage 3b chronic kidney disease due to type 2 diabetes mellitus ([place])   3. Chronic kidney disease, stage 3b ([place])  Under the care of nephrologist   4. Vitamin D deficiency   5. Benign hypertension  At goal   6. Morbid obesity ([place])  Weight is stabilizing now, advised balanced diet   7.  Dyslipidemia

## 2020-12-19 ENCOUNTER — Ambulatory Visit (INDEPENDENT_AMBULATORY_CARE_PROVIDER_SITE_OTHER): Payer: HMO | Admitting: Family Medicine

## 2020-12-19 ENCOUNTER — Other Ambulatory Visit: Payer: Self-pay

## 2020-12-19 ENCOUNTER — Encounter: Payer: Self-pay | Admitting: Family Medicine

## 2020-12-19 VITALS — BP 120/70 | HR 90 | Temp 98.0°F | Resp 16 | Ht 60.0 in | Wt 188.0 lb

## 2020-12-19 DIAGNOSIS — E559 Vitamin D deficiency, unspecified: Secondary | ICD-10-CM | POA: Diagnosis not present

## 2020-12-19 DIAGNOSIS — E785 Hyperlipidemia, unspecified: Secondary | ICD-10-CM | POA: Diagnosis not present

## 2020-12-19 DIAGNOSIS — N182 Chronic kidney disease, stage 2 (mild): Secondary | ICD-10-CM

## 2020-12-19 DIAGNOSIS — I129 Hypertensive chronic kidney disease with stage 1 through stage 4 chronic kidney disease, or unspecified chronic kidney disease: Secondary | ICD-10-CM

## 2020-12-19 DIAGNOSIS — N1832 Chronic kidney disease, stage 3b: Secondary | ICD-10-CM | POA: Diagnosis not present

## 2020-12-19 DIAGNOSIS — E1122 Type 2 diabetes mellitus with diabetic chronic kidney disease: Secondary | ICD-10-CM

## 2020-12-19 DIAGNOSIS — I10 Essential (primary) hypertension: Secondary | ICD-10-CM

## 2020-12-19 LAB — POCT GLYCOSYLATED HEMOGLOBIN (HGB A1C): Hemoglobin A1C: 5.6 % (ref 4.0–5.6)

## 2021-01-03 DIAGNOSIS — R413 Other amnesia: Secondary | ICD-10-CM | POA: Diagnosis not present

## 2021-01-03 DIAGNOSIS — R4586 Emotional lability: Secondary | ICD-10-CM | POA: Diagnosis not present

## 2021-01-03 DIAGNOSIS — R432 Parageusia: Secondary | ICD-10-CM | POA: Diagnosis not present

## 2021-01-15 ENCOUNTER — Other Ambulatory Visit: Payer: Self-pay

## 2021-01-15 DIAGNOSIS — J302 Other seasonal allergic rhinitis: Secondary | ICD-10-CM

## 2021-01-15 DIAGNOSIS — E785 Hyperlipidemia, unspecified: Secondary | ICD-10-CM

## 2021-01-15 DIAGNOSIS — R058 Other specified cough: Secondary | ICD-10-CM

## 2021-01-16 MED ORDER — ATORVASTATIN CALCIUM 40 MG PO TABS: 40.0000 mg | ORAL_TABLET | Freq: Every day | ORAL | 1 refills | Status: DC

## 2021-01-16 MED ORDER — MONTELUKAST SODIUM 10 MG PO TABS
10.0000 mg | ORAL_TABLET | Freq: Every day | ORAL | 1 refills | Status: DC
Start: 2021-01-16 — End: 2021-08-20

## 2021-01-22 ENCOUNTER — Other Ambulatory Visit: Payer: Self-pay

## 2021-01-22 ENCOUNTER — Telehealth: Payer: Self-pay

## 2021-01-22 DIAGNOSIS — Z13820 Encounter for screening for osteoporosis: Secondary | ICD-10-CM

## 2021-01-22 DIAGNOSIS — E0590 Thyrotoxicosis, unspecified without thyrotoxic crisis or storm: Secondary | ICD-10-CM | POA: Diagnosis not present

## 2021-01-22 DIAGNOSIS — E049 Nontoxic goiter, unspecified: Secondary | ICD-10-CM | POA: Diagnosis not present

## 2021-01-22 NOTE — Telephone Encounter (Signed)
Pt called [name] and they informed him that they do not have an order for a bone density and would like for you to resend the order

## 2021-01-22 NOTE — Progress Notes (Signed)
Dexa

## 2021-01-22 NOTE — Telephone Encounter (Signed)
Pt plans on going on vacation for thanksgiving and she would like to take her therapy dog. She is asking a note stating that she is needing the therapy dog with her to take to the resort. If have any questions please contact husband [name] [phone]

## 2021-01-22 NOTE — Telephone Encounter (Signed)
Pt informed that he can call [name] to schedule the bone density

## 2021-01-23 ENCOUNTER — Ambulatory Visit: Payer: Self-pay

## 2021-01-23 ENCOUNTER — Other Ambulatory Visit: Payer: Self-pay

## 2021-01-23 DIAGNOSIS — Z23 Encounter for immunization: Secondary | ICD-10-CM

## 2021-02-04 ENCOUNTER — Ambulatory Visit (INDEPENDENT_AMBULATORY_CARE_PROVIDER_SITE_OTHER): Payer: HMO | Admitting: Family Medicine

## 2021-02-04 ENCOUNTER — Encounter: Payer: Self-pay | Admitting: Family Medicine

## 2021-02-04 ENCOUNTER — Other Ambulatory Visit: Payer: Self-pay

## 2021-02-04 VITALS — BP 132/78 | HR 92 | Temp 98.0°F | Resp 16 | Ht 60.0 in | Wt 191.0 lb

## 2021-02-04 DIAGNOSIS — R1033 Periumbilical pain: Secondary | ICD-10-CM

## 2021-02-04 DIAGNOSIS — R630 Anorexia: Secondary | ICD-10-CM

## 2021-02-04 MED ORDER — ONDANSETRON HCL 4 MG PO TABS: 4.0000 mg | ORAL_TABLET | Freq: Three times a day (TID) | ORAL | 0 refills | Status: DC | PRN

## 2021-02-04 MED ORDER — FAMOTIDINE 40 MG PO TABS: 40.0000 mg | ORAL_TABLET | Freq: Every day | ORAL | 0 refills | Status: DC

## 2021-02-04 NOTE — Progress Notes (Signed)
Name: Yvonne Curtis   MRN: [id number]    DOB: 1950-06-20   Date:02/04/2021       Progress Note  Subjective  Chief Complaint  Abdominal Pain  HPI  Decrease in appetite: patient came in today with her husband. She has dementia and is very hard to get a history from her. Husband provided most of the history. He noticed decrease in her appetite last week, and she has been complaining of some abdominal pain. No fever, chills, vomiting, not sure if any nausea . She cannot described the pain, not sure if constant, no rashes, . Husband states she has not complained of change in taste lately. No sick contacts. Husband is not sure if she has a change in bowel movement, patient states one bowel movement a day [place] 3 and no changes. No blood or mucus in stools. She does not have URI symptoms but husband thinks she may have post-nasal drainage.   Patient Active Problem List   Diagnosis Date Noted   Hypercalcemia 10/11/2020   Chronic kidney disease, stage 3b ([place]) 09/04/2020   Dysphagia    History of colonic polyps    Polyp of transverse colon    Abnormal sense of taste 12/19/2019   Loss of memory 12/19/2019   Mood changes 12/19/2019   Acanthosis nigricans 11/14/2014   Benign hypertension 11/14/2014   Dyslipidemia 11/14/2014   Gastro-esophageal reflux disease without esophagitis 11/14/2014   Extreme obesity 11/14/2014   Primary osteoarthritis of both knees 11/14/2014   Microalbuminuria 10/15/2012   Controlled type 2 diabetes mellitus with microalbuminuria ([place]) 06/05/2009   Body dermatophytosis 12/13/2008    Past Surgical History:  Procedure Laterality Date   CHOLECYSTECTOMY     COLONOSCOPY WITH PROPOFOL N/A 11/30/2014   Procedure: COLONOSCOPY WITH PROPOFOL;  Surgeon: Lucilla Lame, MD;  Location: [facility];  Service: Endoscopy;  Laterality: N/A;  WITH BIOPSY-- TRANSVERSE COLON POLYP  X 2 DESCENING COLON POLYP   COLONOSCOPY WITH PROPOFOL N/A 01/17/2020   Procedure: COLONOSCOPY  WITH PROPOFOL;  Surgeon: Lucilla Lame, MD;  Location: [facility] ENDOSCOPY;  Service: Endoscopy;  Laterality: N/A;   ESOPHAGOGASTRODUODENOSCOPY (EGD) WITH PROPOFOL N/A 08/23/2020   Procedure: ESOPHAGOGASTRODUODENOSCOPY (EGD) WITH PROPOFOL;  Surgeon: Lucilla Lame, MD;  Location: [facility] ENDOSCOPY;  Service: Endoscopy;  Laterality: N/A;   GALLBLADDER SURGERY     SEPTOPLASTY     tonsillectomy     TONSILLECTOMY     TUBAL LIGATION      Family History  Problem Relation Age of Onset   Colon cancer Mother    Lung cancer Father    Hypertension Sister    Hypertension Sister    Breast cancer Neg Hx     Social History   Tobacco Use   Smoking status: Never   Smokeless tobacco: Never  Substance Use Topics   Alcohol use: No    Alcohol/week: 0.0 standard drinks     Current Outpatient Medications:    acetaminophen (TYLENOL) 500 MG tablet, Take by mouth., Disp: , Rfl:    amLODipine (NORVASC) 10 MG tablet, Take 1 tablet (10 mg total) by mouth daily., Disp: 90 tablet, Rfl: 1   atorvastatin (LIPITOR) 40 MG tablet, Take 1 tablet (40 mg total) by mouth daily., Disp: 90 tablet, Rfl: 1   blood glucose meter kit and supplies, Dispense based on patient and insurance preference. Use once daily as directed. (FOR ICD-10 E10.9, E11.9). STrips 100 Lancets 100, Disp: 1 each, Rfl: 0   cetirizine (ZYRTEC) 10 MG tablet, Take 1 tablet (  10 mg total) by mouth daily as needed., Disp: 90 tablet, Rfl: 1   Cholecalciferol (VITAMIN D3) 2000 UNITS capsule, VITAMIN D3, 2000UNIT (Oral Capsule)  2 po qday for 0 days  Quantity: 60.00;  Refills: 0   Ordered :15-May-2010  Steele Sizer MD;  Started 05-October-2008 Active, Disp: , Rfl:    donepezil (ARICEPT) 10 MG tablet, Take by mouth., Disp: , Rfl:    Lancets (ONETOUCH DELICA PLUS [id number]) MISC, Use as directed to check blood sugar, Disp: , Rfl:    memantine (NAMENDA) 5 MG tablet, Take 1 tablet by mouth 2 (two) times daily., Disp: , Rfl:    mirtazapine (REMERON) 7.5 MG tablet, Take 1  tablet by mouth at bedtime., Disp: , Rfl:    montelukast (SINGULAIR) 10 MG tablet, Take 1 tablet (10 mg total) by mouth at bedtime., Disp: 90 tablet, Rfl: 1   ONETOUCH ULTRA test strip, 1 each daily. Use as directed to check blood sugar, Disp: , Rfl:    pantoprazole (PROTONIX) 40 MG tablet, Take 1 tablet (40 mg total) by mouth daily., Disp: 30 tablet, Rfl: 11   telmisartan-hydrochlorothiazide (MICARDIS HCT) 80-25 MG tablet, Take 1 tablet by mouth daily., Disp: 90 tablet, Rfl: 1  Allergies  Allergen Reactions   Oxycodone Other (See Comments)   Oxycodone Hcl     insomnia, agitation    I personally reviewed active problem list, medication list, allergies, family history, social history, health maintenance with the patient/caregiver today.   ROS  Ten systems reviewed and is negative except as mentioned in HPI   Objective  Vitals:   02/04/21 1113  BP: 132/78  Pulse: 92  Resp: 16  Temp: 98 F (36.7 C)  SpO2: 98%  Weight: 191 lb (86.6 kg)  Height: 5' (1.524 m)    Body mass index is 37.3 kg/m.  Physical Exam  Constitutional: Patient appears well-developed and well-nourished. Obese  No distress.  HEENT: head atraumatic, normocephalic, pupils equal and reactive to light, ears normal, geographic tongue, neck supple, throat within normal limits Cardiovascular: Normal rate, regular rhythm and normal heart sounds.  No murmur heard. No BLE edema. Pulmonary/Chest: Effort normal and breath sounds normal. No respiratory distress. Abdominal: Soft.  There is no tenderness. Psychiatric: Patient has a normal mood and affect. Cooperative, but not a reliable historian due to memory problems.   Recent Results (from the past 2160 hour(s))  HM DIABETES EYE EXAM     Status: None   Collection Time: 12/12/20 12:00 AM  Result Value Ref Range   HM Diabetic Eye Exam No Retinopathy No Retinopathy  POCT HgB A1C     Status: None   Collection Time: 12/19/20  2:06 PM  Result Value Ref Range    Hemoglobin A1C 5.6 4.0 - 5.6 %   HbA1c POC (<> result, manual entry)     HbA1c, POC (prediabetic range)     HbA1c, POC (controlled diabetic range)        PHQ2/9: Depression screen [facility] 2/9 02/04/2021 12/19/2020 09/04/2020 07/24/2020 06/11/2020  Decreased Interest _0 Down, Depressed, Hopeless _1 0 0  PHQ - 2 Score _2 Altered sleeping 3 0 0 0 -  Tired, decreased energy 3 0 0 0 -  Change in appetite 3 0 0 3 -  Feeling bad or failure about yourself  0 0 0 0 -  Trouble concentrating 0 0 0 0 -  Moving slowly or fidgety/restless 0 0 0 0 -  Suicidal thoughts 0 0 0 0 -  PHQ-9 Score _0 -  Difficult doing work/chores - - - - -  Some recent data might be hidden    phq 9 is positive   Fall Risk: Fall Risk  02/04/2021 12/19/2020 09/04/2020 07/24/2020 07/04/2020  Falls in the past year? _1 Number falls in past yr: _2 Injury with Fall? 0 0 0 0 0  Risk for fall due to : History of fall(s) - - - History of fall(s);Other (Comment)  Risk for fall due to: Comment - - - - Reports recent fall r/t decreased intake  Follow up Falls prevention discussed - - - Falls prevention discussed      Functional Status Survey: Is the patient deaf or have difficulty hearing?: No Does the patient have difficulty seeing, even when wearing glasses/contacts?: No Does the patient have difficulty concentrating, remembering, or making decisions?: Yes Does the patient have difficulty walking or climbing stairs?: Yes Does the patient have difficulty dressing or bathing?: Yes Does the patient have difficulty doing errands alone such as visiting a doctor's office or shopping?: Yes    Assessment & Plan  1. Acute periumbilical pain  - CBC with Differential/Platelet - COMPLETE METABOLIC PANEL WITH GFR - ondansetron (ZOFRAN) 4 MG tablet; Take 1 tablet (4 mg total) by mouth every 8 (eight) hours as needed for nausea or vomiting.  Dispense: 20 tablet; Refill: 0 - famotidine (PEPCID) 40 MG  tablet; Take 1 tablet (40 mg total) by mouth at bedtime.  Dispense: 30 tablet; Refill: 0  Explained that they need to go to [facility] if symptoms gets worse, we will check labs and if normal but still having symptoms refer to GI. It may be a viral illness and may resolve by itself, but if signs of bacterial infection on labs we may need to do a CT   2. Lack of appetite  - CBC with Differential/Platelet - COMPLETE METABOLIC PANEL WITH GFR - ondansetron (ZOFRAN) 4 MG tablet; Take 1 tablet (4 mg total) by mouth every 8 (eight) hours as needed for nausea or vomiting.  Dispense: 20 tablet; Refill: 0 - famotidine (PEPCID) 40 MG tablet; Take 1 tablet (40 mg total) by mouth at bedtime.  Dispense: 30 tablet; Refill: 0

## 2021-02-05 LAB — COMPLETE METABOLIC PANEL WITH GFR
AG Ratio: 1.4 (calc) (ref 1.0–2.5)
ALT: 10 U/L (ref 6–29)
AST: 13 U/L (ref 10–35)
Albumin: 4.3 g/dL (ref 3.6–5.1)
Alkaline phosphatase (APISO): 95 U/L (ref 37–153)
BUN/Creatinine Ratio: 7 (calc) (ref 6–22)
BUN: 10 mg/dL (ref 7–25)
CO2: 29 mmol/L (ref 20–32)
Calcium: 10.5 mg/dL — ABNORMAL HIGH (ref 8.6–10.4)
Chloride: 103 mmol/L (ref 98–110)
Creat: 1.49 mg/dL — ABNORMAL HIGH (ref 0.60–1.00)
Globulin: 3 g/dL (calc) (ref 1.9–3.7)
Glucose, Bld: 147 mg/dL — ABNORMAL HIGH (ref 65–99)
Potassium: 3.3 mmol/L — ABNORMAL LOW (ref 3.5–5.3)
Sodium: 142 mmol/L (ref 135–146)
Total Bilirubin: 0.6 mg/dL (ref 0.2–1.2)
Total Protein: 7.3 g/dL (ref 6.1–8.1)
eGFR: 37 mL/min/{1.73_m2} — ABNORMAL LOW (ref >=60)

## 2021-02-05 LAB — CBC WITH DIFFERENTIAL/PLATELET
Absolute Monocytes: 588 cells/uL (ref 200–950)
Basophils Absolute: 68 cells/uL (ref 0–200)
Basophils Relative: 0.6 %
Eosinophils Absolute: 136 cells/uL (ref 15–500)
Eosinophils Relative: 1.2 %
HCT: 38.6 % (ref 35.0–45.0)
Hemoglobin: 12.5 g/dL (ref 11.7–15.5)
Lymphs Abs: 1526 cells/uL (ref 850–3900)
MCH: 27.7 pg (ref 27.0–33.0)
MCHC: 32.4 g/dL (ref 32.0–36.0)
MCV: 85.4 fL (ref 80.0–100.0)
MPV: 10 fL (ref 7.5–12.5)
Monocytes Relative: 5.2 %
Neutro Abs: 8984 cells/uL — ABNORMAL HIGH (ref 1500–7800)
Neutrophils Relative %: 79.5 %
Platelets: 465 10*3/uL — ABNORMAL HIGH (ref 140–400)
RBC: 4.52 10*6/uL (ref 3.80–5.10)
RDW: 14.8 % (ref 11.0–15.0)
Total Lymphocyte: 13.5 %
WBC: 11.3 10*3/uL — ABNORMAL HIGH (ref 3.8–10.8)

## 2021-02-07 DIAGNOSIS — I10 Essential (primary) hypertension: Secondary | ICD-10-CM | POA: Diagnosis not present

## 2021-02-07 DIAGNOSIS — E7800 Pure hypercholesterolemia, unspecified: Secondary | ICD-10-CM | POA: Insufficient documentation

## 2021-02-07 DIAGNOSIS — N1832 Chronic kidney disease, stage 3b: Secondary | ICD-10-CM | POA: Diagnosis not present

## 2021-02-07 DIAGNOSIS — D631 Anemia in chronic kidney disease: Secondary | ICD-10-CM | POA: Diagnosis not present

## 2021-02-07 DIAGNOSIS — N189 Chronic kidney disease, unspecified: Secondary | ICD-10-CM | POA: Insufficient documentation

## 2021-02-07 DIAGNOSIS — R809 Proteinuria, unspecified: Secondary | ICD-10-CM | POA: Diagnosis not present

## 2021-02-07 DIAGNOSIS — E1122 Type 2 diabetes mellitus with diabetic chronic kidney disease: Secondary | ICD-10-CM | POA: Diagnosis not present

## 2021-02-11 ENCOUNTER — Ambulatory Visit
Admission: RE | Admit: 2021-02-11 | Discharge: 2021-02-11 | Disposition: A | Discharge status: Home / Self Care | Payer: HMO | Source: Ambulatory Visit | Attending: Family Medicine | Admitting: Family Medicine

## 2021-02-11 ENCOUNTER — Other Ambulatory Visit: Payer: Self-pay

## 2021-02-11 DIAGNOSIS — E119 Type 2 diabetes mellitus without complications: Secondary | ICD-10-CM | POA: Insufficient documentation

## 2021-02-11 DIAGNOSIS — Z780 Asymptomatic menopausal state: Secondary | ICD-10-CM | POA: Diagnosis not present

## 2021-02-11 DIAGNOSIS — Z13820 Encounter for screening for osteoporosis: Secondary | ICD-10-CM | POA: Insufficient documentation

## 2021-02-14 DIAGNOSIS — R630 Anorexia: Secondary | ICD-10-CM | POA: Diagnosis not present

## 2021-02-14 DIAGNOSIS — R634 Abnormal weight loss: Secondary | ICD-10-CM | POA: Diagnosis not present

## 2021-02-14 DIAGNOSIS — R432 Parageusia: Secondary | ICD-10-CM | POA: Diagnosis not present

## 2021-02-14 DIAGNOSIS — F02B3 Dementia in other diseases classified elsewhere, moderate, with mood disturbance: Secondary | ICD-10-CM | POA: Diagnosis not present

## 2021-02-14 DIAGNOSIS — F32A Depression, unspecified: Secondary | ICD-10-CM | POA: Diagnosis not present

## 2021-02-14 DIAGNOSIS — G4700 Insomnia, unspecified: Secondary | ICD-10-CM | POA: Diagnosis not present

## 2021-02-14 DIAGNOSIS — G301 Alzheimer's disease with late onset: Secondary | ICD-10-CM | POA: Diagnosis not present

## 2021-02-14 DIAGNOSIS — F09 Unspecified mental disorder due to known physiological condition: Secondary | ICD-10-CM | POA: Diagnosis not present

## 2021-03-11 DIAGNOSIS — N1832 Chronic kidney disease, stage 3b: Secondary | ICD-10-CM | POA: Diagnosis not present

## 2021-03-11 DIAGNOSIS — D631 Anemia in chronic kidney disease: Secondary | ICD-10-CM | POA: Diagnosis not present

## 2021-03-11 DIAGNOSIS — R809 Proteinuria, unspecified: Secondary | ICD-10-CM | POA: Diagnosis not present

## 2021-03-11 DIAGNOSIS — I10 Essential (primary) hypertension: Secondary | ICD-10-CM | POA: Diagnosis not present

## 2021-03-11 DIAGNOSIS — E1122 Type 2 diabetes mellitus with diabetic chronic kidney disease: Secondary | ICD-10-CM | POA: Diagnosis not present

## 2021-03-14 DIAGNOSIS — E785 Hyperlipidemia, unspecified: Secondary | ICD-10-CM | POA: Diagnosis not present

## 2021-03-14 DIAGNOSIS — F03911 Unspecified dementia, unspecified severity, with agitation: Secondary | ICD-10-CM | POA: Diagnosis not present

## 2021-03-14 DIAGNOSIS — E1122 Type 2 diabetes mellitus with diabetic chronic kidney disease: Secondary | ICD-10-CM | POA: Diagnosis not present

## 2021-03-14 DIAGNOSIS — N1832 Chronic kidney disease, stage 3b: Secondary | ICD-10-CM | POA: Diagnosis not present

## 2021-03-14 DIAGNOSIS — E7800 Pure hypercholesterolemia, unspecified: Secondary | ICD-10-CM | POA: Diagnosis not present

## 2021-03-14 DIAGNOSIS — R801 Persistent proteinuria, unspecified: Secondary | ICD-10-CM | POA: Diagnosis not present

## 2021-03-14 DIAGNOSIS — R809 Proteinuria, unspecified: Secondary | ICD-10-CM | POA: Diagnosis not present

## 2021-03-14 DIAGNOSIS — I10 Essential (primary) hypertension: Secondary | ICD-10-CM | POA: Diagnosis not present

## 2021-03-14 DIAGNOSIS — E1129 Type 2 diabetes mellitus with other diabetic kidney complication: Secondary | ICD-10-CM | POA: Diagnosis not present

## 2021-03-14 DIAGNOSIS — D631 Anemia in chronic kidney disease: Secondary | ICD-10-CM | POA: Diagnosis not present

## 2021-03-18 ENCOUNTER — Other Ambulatory Visit: Payer: Self-pay

## 2021-03-18 ENCOUNTER — Telehealth: Payer: Self-pay

## 2021-03-18 DIAGNOSIS — J302 Other seasonal allergic rhinitis: Secondary | ICD-10-CM

## 2021-03-18 DIAGNOSIS — I10 Essential (primary) hypertension: Secondary | ICD-10-CM

## 2021-03-18 MED ORDER — CETIRIZINE HCL 10 MG PO TABS: 10.0000 mg | ORAL_TABLET | Freq: Every day | ORAL | 1 refills | Status: DC | PRN

## 2021-03-18 MED ORDER — AMLODIPINE BESYLATE 10 MG PO TABS: 10.0000 mg | ORAL_TABLET | Freq: Every day | ORAL | 1 refills | Status: DC

## 2021-03-18 NOTE — Telephone Encounter (Signed)
Requesting refill on amlodipine 10mg  and cetirizne 10mg . Please send to [facility]. Pt is completely out

## 2021-03-25 ENCOUNTER — Other Ambulatory Visit: Payer: Self-pay

## 2021-03-25 ENCOUNTER — Encounter: Payer: Self-pay | Admitting: Family Medicine

## 2021-03-25 ENCOUNTER — Ambulatory Visit (INDEPENDENT_AMBULATORY_CARE_PROVIDER_SITE_OTHER): Payer: HMO | Admitting: Family Medicine

## 2021-03-25 VITALS — BP 136/80 | HR 99 | Temp 97.6°F | Resp 16 | Ht 60.0 in | Wt 191.8 lb

## 2021-03-25 DIAGNOSIS — R1033 Periumbilical pain: Secondary | ICD-10-CM

## 2021-03-25 MED ORDER — METRONIDAZOLE 500 MG PO TABS: 500.0000 mg | ORAL_TABLET | Freq: Three times a day (TID) | ORAL | 0 refills | Status: AC

## 2021-03-25 MED ORDER — CIPROFLOXACIN HCL 500 MG PO TABS: 500.0000 mg | ORAL_TABLET | Freq: Two times a day (BID) | ORAL | 0 refills | Status: AC

## 2021-03-25 NOTE — Progress Notes (Signed)
   SUBJECTIVE:   CHIEF COMPLAINT / HPI:   ABDOMINAL PAIN  - h/o GERD on pepcid, protonix - EGD 08/2020 with small hiatal hernia, otherwise wnl.  - Colonoscopy 01/2020 with 2 benign polyps, non-bleeding internal hemorrhoids, diverticulosis. - 1-2 Bms per day, loose. No change. - prior cholecystectomy, BTL  Duration: about 2 months Quality:  nagging, cramping. Location:  peri-umbilical  Episode duration:  Radiation: no Frequency: intermittent, almost every day Alleviating factors: eating Aggravating factors: unsure Status: better Treatments attempted: pepcid Fever: no Nausea:  sometimes Vomiting: no Weight loss: no Decreased appetite: yes, not new Diarrhea:  some loose stools Constipation: no Blood in stool: no Heartburn: no Jaundice: no Rash: no Dysuria/urinary frequency: no Hematuria: no History of sexually transmitted disease: no Recurrent NSAID use: no Currently pain free.  OBJECTIVE:   BP 136/80   Pulse 99   Temp 97.6 F (36.4 C)   Resp 16   Ht 5' (1.524 m)   Wt 191 lb 12.8 oz (87 kg)   SpO2 98%   BMI 37.46 kg/m   Gen: well appearing, in NAD Card: RRR Lungs: CTAB Abd: soft, NTND, +BS. No rebound tenderness or guarding. Ext: WWP, no edema   ASSESSMENT/PLAN:   Abdominal Pain Unclear etiology. History difficult to ascertain given dementia. Labs previously obtained mostly unremarkable, slight elevated WBC with neutrophil predominant but only slightly. With recent results, difficult history, known diverticulosis, and periumbilical/lower quadrant abd pain, loose stools will treat as diverticulitis. F/u in one week if no better, recommend GI referral/stool studies if no better at that time.   Myles Gip, DO

## 2021-03-25 NOTE — Patient Instructions (Signed)
It was great to see you!  Our plans for today:  - Take the antibiotic as prescribed.  - Come back next week if your pain is no better.   Take care and seek immediate care sooner if you develop any concerns.   Dr. Ky Barban

## 2021-04-01 ENCOUNTER — Ambulatory Visit: Payer: Self-pay | Admitting: Family Medicine

## 2021-04-01 NOTE — Telephone Encounter (Signed)
Husband Yvonne Curtis called in concerned that his wife who has dementia will not take her pills this morning.    See triage notes for details. I let him know I would pass this information along to Dr. Ancil Boozer.  I sent my notes to [facility]

## 2021-04-01 NOTE — Telephone Encounter (Signed)
Brayton Mars calling in.   She is not taking her medicine.   I don't know what to do.   She's not done this before.   She starts crying when I try to give her her pills.   She doesn't want to take it.     She has dementia.   She understands me.  She refusing all of it.    I suggested he try later.   "I've tried that but she won't take her pills".    "She just starts crying".    I asked him if she was in pain.    "She saw the dr. Vanetta Shawl pain recently but she says her stomach is not hurting".  "I think she doesn't like the taste of the stomach medicine she was given for the stomach and it's messing her up and making her cry".  She only ate a bowel of cereal yesterday.   Maybe a spoon full of corn.     She is drinking a lot of water.   She denies stomach pain and diarrhea.    She has an appt with a stomach dr. Marylene Buerger at 10:00.     I suggested he try again in an hour or two which he was agreeable to doing.     Reason for Disposition . Nursing judgment  Protocols used: No Guideline or Reference Available-[facility]

## 2021-04-02 ENCOUNTER — Other Ambulatory Visit: Payer: Self-pay

## 2021-04-02 ENCOUNTER — Ambulatory Visit (INDEPENDENT_AMBULATORY_CARE_PROVIDER_SITE_OTHER): Payer: HMO | Admitting: Family Medicine

## 2021-04-02 ENCOUNTER — Encounter: Payer: Self-pay | Admitting: Family Medicine

## 2021-04-02 VITALS — BP 132/84 | HR 98 | Temp 98.3°F | Resp 16 | Ht 60.0 in | Wt 190.8 lb

## 2021-04-02 DIAGNOSIS — R1033 Periumbilical pain: Secondary | ICD-10-CM

## 2021-04-02 NOTE — Progress Notes (Signed)
   SUBJECTIVE:   CHIEF COMPLAINT / HPI:   ABDOMINAL ISSUES - seen last week for same. Provided cipro/flagyl for presumed diverticulitis. Here for follow up today.  - took most of antibiotic, all but 2 pills.  - pain resolved. - still with poor appetite, not new.  - denies trouble with stools, no blood.  - still a little nauseous, not taking pepcid or protonix. No vomiting.   OBJECTIVE:   BP 132/84   Pulse 98   Temp 98.3 F (36.8 C)   Resp 16   Ht 5' (1.524 m)   Wt 190 lb 12.8 oz (86.5 kg)   SpO2 98%   BMI 37.26 kg/m   Gen: well appearing, in NAD Card: Reg rate Lungs: Comfortable WOB on RA Ext: WWP, no edema   ASSESSMENT/PLAN:   Abdominal pain Pain and loose stools resolved s/p diverticulitis treatment.  Suspect intermittent nausea/lack of appetite due to GERD, dementia, not currently on PPI or H2 blocker, recommend consistent use. A1c within goal range for years, doubt gastroparesis.    Myles Gip, DO

## 2021-04-08 ENCOUNTER — Other Ambulatory Visit: Payer: Self-pay | Admitting: Family Medicine

## 2021-04-08 DIAGNOSIS — Z1231 Encounter for screening mammogram for malignant neoplasm of breast: Secondary | ICD-10-CM

## 2021-04-17 NOTE — Progress Notes (Signed)
Name: Yvonne Curtis   MRN: [id number]    DOB: [birth year]   Date:04/18/2021       Progress Note  Subjective  Chief Complaint  Follow up   HPI  DMII with renal manifestation: she has been off Metformin and  A1C  has been stable today it was 5.5% She denies polyphagia, polyuria or polydipsia. Last urine micro negative but she has a history of microalbuminuria that resolved with ARB. She is now under the care of nephrologist. She also has obesity and weight is stable.   MDD: she on on Citalopram but currently only taking Remeron, she has crying spells on a regular basis, gets agitated at times, difficulty sleeping. Seeing neurologist at [facility] for second opinion and was advised to consider zoloft. She is currently refusing to take any medications. Discussed palliative care but patient and husband refused at this time   Morbid obesity/Malnutrition: BMI above 35 with co-morbidities such as DM, HTN , CKI.She had lost 37 lbs in the past year, but weight is stable now  CKI stage III: seen by Dr. Suezanne Jacquet - nephrologist recently, BP is elevated but she has not been taking bp mediation due to refusal to swallow pills.He changed micardis hctz to micardis 80 mg since she has hypercalcemia. She denies chest pain or palpitation  Alzheimer's dementia with mood changes: seen by Dr. Malvin Johns neurologist and had a visit at [facility] for second opinion 09/22. Reviewed notes. She was advised to take Aricept in am, Namenda twice daily and consider going up on dose of Remeron and add Zoloft since not taking any anti-depressants. Patient had abdominal pain and has refused to take medications for the past month, today she said she will try to take it again. Mood still goes up and down, husband wants to continue taking care of her at home. Discussed future changes and when to consider help at home and or nursing home placement for her safely. He already had child locks to prevent her from wandering at night.   Abdominal pain:  happened a couple of months ago, she states not pain now, no change in bowel movements, last WBC still elevated but refuses to have labs done today. No nausea or vomiting. Appetite waxes and wanes    Patient Active Problem List   Diagnosis Date Noted   Anemia in chronic kidney disease 02/07/2021   Pure hypercholesterolemia, unspecified 02/07/2021   Hypercalcemia 10/11/2020   Chronic kidney disease, stage 3b (HCC) 09/04/2020   Dysphagia    History of colonic polyps    Polyp of transverse colon    Abnormal sense of taste 12/19/2019   Loss of memory 12/19/2019   Mood changes 12/19/2019   Acanthosis nigricans 11/14/2014   Benign hypertension 11/14/2014   Dyslipidemia 11/14/2014   Gastro-esophageal reflux disease without esophagitis 11/14/2014   Extreme obesity 11/14/2014   Primary osteoarthritis of both knees 11/14/2014   Microalbuminuria 10/15/2012   Controlled type 2 diabetes mellitus with microalbuminuria (HCC) 06/05/2009   Body dermatophytosis 12/13/2008    Past Surgical History:  Procedure Laterality Date   CHOLECYSTECTOMY     COLONOSCOPY WITH PROPOFOL N/A 11/30/2014   Procedure: COLONOSCOPY WITH PROPOFOL;  Surgeon: Midge Minium, MD;  Location: [facility];  Service: Endoscopy;  Laterality: N/A;  WITH BIOPSY-- TRANSVERSE COLON POLYP  X 2 DESCENING COLON POLYP   COLONOSCOPY WITH PROPOFOL N/A 01/17/2020   Procedure: COLONOSCOPY WITH PROPOFOL;  Surgeon: Midge Minium, MD;  Location: [facility];  Service: Endoscopy;  Laterality: N/A;  ESOPHAGOGASTRODUODENOSCOPY (EGD) WITH PROPOFOL N/A 08/23/2020   Procedure: ESOPHAGOGASTRODUODENOSCOPY (EGD) WITH PROPOFOL;  Surgeon: Lucilla Lame, MD;  Location: [facility] END[facility]COPY;  Service: Endoscopy;  Laterality: N/A;   GALLBLADDER SURGERY     SEPTOPLASTY     tonsillectomy     TONSILLECTOMY     TUBAL LIGATION      Family History  Problem Relation Age of Onset   Colon cancer Mother    Lung cancer Father    Hypertension Sister     Hypertension Sister    Breast cancer Neg Hx     Social History   Tobacco Use   Smoking status: Never   Smokeless tobacco: Never  Substance Use Topics   Alcohol use: No    Alcohol/week: 0.0 standard drinks     Current Outpatient Medications:    acetaminophen (TYLENOL) 500 MG tablet, Take by mouth. (Patient not taking: Reported on 04/18/2021), Disp: , Rfl:    amLODipine (NORVASC) 10 MG tablet, Take 1 tablet (10 mg total) by mouth daily. (Patient not taking: Reported on 04/18/2021), Disp: 90 tablet, Rfl: 1   atorvastatin (LIPITOR) 40 MG tablet, Take 1 tablet (40 mg total) by mouth daily. (Patient not taking: Reported on 04/18/2021), Disp: 90 tablet, Rfl: 1   blood glucose meter kit and supplies, Dispense based on patient and insurance preference. Use once daily as directed. (FOR ICD-10 E10.9, E11.9). STrips 100 Lancets 100 (Patient not taking: Reported on 04/18/2021), Disp: 1 each, Rfl: 0   cetirizine (ZYRTEC) 10 MG tablet, Take 1 tablet (10 mg total) by mouth daily as needed. (Patient not taking: Reported on 04/18/2021), Disp: 90 tablet, Rfl: 1   Cholecalciferol (VITAMIN D3) 2000 UNITS capsule, VITAMIN D3, 2000UNIT (Oral Capsule)  2 po qday for 0 days  Quantity: 60.00;  Refills: 0   Ordered :15-May-2010  Steele Sizer MD;  Started 05-October-2008 Active (Patient not taking: Reported on 04/18/2021), Disp: , Rfl:    donepezil (ARICEPT) 10 MG tablet, Take by mouth. (Patient not taking: Reported on 04/18/2021), Disp: , Rfl:    famotidine (PEPCID) 40 MG tablet, Take 1 tablet (40 mg total) by mouth at bedtime. (Patient not taking: Reported on 04/18/2021), Disp: 30 tablet, Rfl: 0   Lancets (ONETOUCH DELICA PLUS [id number]) MISC, Use as directed to check blood sugar (Patient not taking: Reported on 04/18/2021), Disp: , Rfl:    memantine (NAMENDA) 5 MG tablet, Take 1 tablet by mouth 2 (two) times daily. (Patient not taking: Reported on 04/18/2021), Disp: , Rfl:    mirtazapine (REMERON) 7.5 MG tablet,  Take 1 tablet by mouth at bedtime. (Patient not taking: Reported on 04/18/2021), Disp: , Rfl:    montelukast (SINGULAIR) 10 MG tablet, Take 1 tablet (10 mg total) by mouth at bedtime. (Patient not taking: Reported on 04/18/2021), Disp: 90 tablet, Rfl: 1   ondansetron (ZOFRAN) 4 MG tablet, Take 1 tablet (4 mg total) by mouth every 8 (eight) hours as needed for nausea or vomiting. (Patient not taking: Reported on 04/18/2021), Disp: 20 tablet, Rfl: 0   ONETOUCH ULTRA test strip, 1 each daily. Use as directed to check blood sugar (Patient not taking: Reported on 04/18/2021), Disp: , Rfl:    pantoprazole (PROTONIX) 40 MG tablet, Take 1 tablet (40 mg total) by mouth daily. (Patient not taking: Reported on 04/18/2021), Disp: 30 tablet, Rfl: 11   telmisartan-hydrochlorothiazide (MICARDIS HCT) 80-25 MG tablet, Take 1 tablet by mouth daily. (Patient not taking: Reported on 04/18/2021), Disp: 90 tablet, Rfl: 1  Allergies  Allergen Reactions  Oxycodone Other (See Comments)   Oxycodone Hcl     insomnia, agitation    I personally reviewed active problem list, medication list, allergies, family history, social history, health maintenance with the patient/caregiver today.   ROS  Ten systems reviewed and is negative except as mentioned in HPI   Objective  Vitals:   04/18/21 0853  BP: (!) 146/92  Pulse: 94  Resp: 18  Temp: 98.2 F (36.8 C)  TempSrc: Oral  SpO2: 97%  Weight: 190 lb (86.2 kg)  Height: 5' (1.524 m)    Body mass index is 37.11 kg/m.  Physical Exam  Constitutional: Patient appears well-developed and well-nourished. Obese  No distress.  HEENT: head atraumatic, normocephalic, pupils equal and reactive to light, neck supple, Cardiovascular: Normal rate, regular rhythm and normal heart sounds.  No murmur heard. No BLE edema. Pulmonary/Chest: Effort normal and breath sounds normal. No respiratory distress. Abdominal: Soft.  There is no tenderness. Psychiatric: Patient has a normal  mood and affect. Seemed a little more anxious than usual, wanted to go home   Recent Results (from the past 2160 hour(s))  CBC with Differential/Platelet     Status: Abnormal   Collection Time: 02/04/21 12:04 PM  Result Value Ref Range   WBC 11.3 (H) 3.8 - 10.8 Thousand/uL   RBC 4.52 3.80 - 5.10 Million/uL   Hemoglobin 12.5 11.7 - 15.5 g/dL   HCT 85.1 87.9 - 91.1 %   MCV 85.4 80.0 - 100.0 fL   MCH 27.7 27.0 - 33.0 pg   MCHC 32.4 32.0 - 36.0 g/dL   RDW 79.9 40.5 - 00.3 %   Platelets 465 (H) 140 - 400 Thousand/uL   MPV 10.0 7.5 - 12.5 fL   Neutro Abs 8,984 (H) 1,500 - 7,800 cells/uL   Lymphs Abs 1,526 850 - 3,900 cells/uL   Absolute Monocytes 588 200 - 950 cells/uL   Eosinophils Absolute 136 15 - 500 cells/uL   Basophils Absolute 68 0 - 200 cells/uL   Neutrophils Relative % 79.5 %   Total Lymphocyte 13.5 %   Monocytes Relative 5.2 %   Eosinophils Relative 1.2 %   Basophils Relative 0.6 %  COMPLETE METABOLIC PANEL WITH GFR     Status: Abnormal   Collection Time: 02/04/21 12:04 PM  Result Value Ref Range   Glucose, Bld 147 (H) 65 - 99 mg/dL    Comment: .            Fasting reference interval . For someone without known diabetes, a glucose value >125 mg/dL indicates that they may have diabetes and this should be confirmed with a follow-up test. .    BUN 10 7 - 25 mg/dL   Creat 6.01 (H) 1.26 - 1.00 mg/dL   eGFR 37 (L) > OR = 60 mL/min/1.39m2    Comment: The eGFR is based on the CKD-EPI 2021 equation. To calculate  the new eGFR from a previous Creatinine or Cystatin C result, go to https://www.kidney.org/professionals/ kdoqi/gfr%5Fcalculator    BUN/Creatinine Ratio 7 6 - 22 (calc)   Sodium 142 135 - 146 mmol/L   Potassium 3.3 (L) 3.5 - 5.3 mmol/L   Chloride 103 98 - 110 mmol/L   CO2 29 20 - 32 mmol/L   Calcium 10.5 (H) 8.6 - 10.4 mg/dL   Total Protein 7.3 6.1 - 8.1 g/dL   Albumin 4.3 3.6 - 5.1 g/dL   Globulin 3.0 1.9 - 3.7 g/dL (calc)   AG Ratio 1.4 1.0 - 2.5 (calc)  Total Bilirubin 0.6 0.2 - 1.2 mg/dL   Alkaline phosphatase (APISO) 95 37 - 153 U/L   AST 13 10 - 35 U/L   ALT 10 6 - 29 U/L  POCT HgB A1C     Status: Normal   Collection Time: 04/18/21  8:59 AM  Result Value Ref Range   Hemoglobin A1C 5.5 4.0 - 5.6 %   HbA1c POC (<> result, manual entry)     HbA1c, POC (prediabetic range)     HbA1c, POC (controlled diabetic range)        PHQ2/9: Depression screen [facility] 2/9 04/18/2021 04/18/2021 04/02/2021 03/25/2021 02/04/2021  Decreased Interest 3 3 0 2 2  Down, Depressed, Hopeless 1 1 0 3 2  PHQ - 2 Score 4 4 0 5 4  Altered sleeping 0 0 0 0 3  Tired, decreased energy 0 0 0 0 3  Change in appetite 3 3 0 0 3  Feeling bad or failure about yourself  0 0 0 0 0  Trouble concentrating 3 3 0 0 0  Moving slowly or fidgety/restless 0 0 0 0 0  Suicidal thoughts 0 0 0 0 0  PHQ-9 Score 10 10 0 5 13  Difficult doing work/chores Not difficult at all Not difficult at all Not difficult at all Somewhat difficult -  Some recent data might be hidden    phq 9 is positive   Fall Risk: Fall Risk  04/18/2021 04/18/2021 04/02/2021 03/25/2021 02/04/2021  Falls in the past year? $RemoveBe'1 1 1 1 1  'XSsflBkvX$ Number falls in past yr: $Remove'1 1 1 1 1  'OQDloYe$ Injury with Fall? $RemoveBe'1 1 1 'SEhfcJQgB$ 0 0  Risk for fall due to : Impaired balance/gait;History of fall(s) History of fall(s);Impaired balance/gait Impaired balance/gait;Impaired mobility Impaired balance/gait;Impaired mobility;Mental status change History of fall(s)  Risk for fall due to: Comment - - - - -  Follow up Falls prevention discussed;Falls evaluation completed Falls prevention discussed - - Falls prevention discussed      Functional Status Survey: Is the patient deaf or have difficulty hearing?: No Does the patient have difficulty seeing, even when wearing glasses/contacts?: No Does the patient have difficulty concentrating, remembering, or making decisions?: Yes Does the patient have difficulty walking or climbing stairs?: Yes Does the  patient have difficulty dressing or bathing?: Yes Does the patient have difficulty doing errands alone such as visiting a doctor's office or shopping?: Yes    Assessment & Plan  1. Controlled type 2 diabetes mellitus with stage 3 chronic kidney disease, without long-term current use of insulin (HCC)  - POCT HgB A1C  2. Need for Tdap vaccination  Advised to go to local pharmacy   3. Hypertension associated with stage 3b chronic kidney disease due to type 2 diabetes mellitus ([place])  Keep follow up with nephrologist, but also needs to take medications  4. Morbid obesity ([place])  Now we plan on maintaining her weight due to Alzheimer's   5. Vitamin D deficiency   6. Moderate episode of recurrent major depressive disorder (HCC)  Off medications, likely from Alzheimer's causing mood changes  7. Moderate Alzheimer's dementia of other onset with mood disturbance (HCC)  Continue medication

## 2021-04-18 ENCOUNTER — Encounter: Payer: Self-pay | Admitting: Family Medicine

## 2021-04-18 ENCOUNTER — Ambulatory Visit: Payer: HMO

## 2021-04-18 ENCOUNTER — Ambulatory Visit (INDEPENDENT_AMBULATORY_CARE_PROVIDER_SITE_OTHER): Payer: HMO | Admitting: Family Medicine

## 2021-04-18 ENCOUNTER — Other Ambulatory Visit: Payer: Self-pay

## 2021-04-18 VITALS — BP 146/82 | HR 94 | Temp 98.2°F | Resp 16 | Ht 60.0 in | Wt 190.0 lb

## 2021-04-18 VITALS — BP 146/92 | HR 94 | Temp 98.2°F | Resp 18 | Ht 60.0 in | Wt 190.0 lb

## 2021-04-18 DIAGNOSIS — Z23 Encounter for immunization: Secondary | ICD-10-CM | POA: Diagnosis not present

## 2021-04-18 DIAGNOSIS — N1832 Chronic kidney disease, stage 3b: Secondary | ICD-10-CM | POA: Diagnosis not present

## 2021-04-18 DIAGNOSIS — N183 Chronic kidney disease, stage 3 unspecified: Secondary | ICD-10-CM | POA: Diagnosis not present

## 2021-04-18 DIAGNOSIS — G308 Other Alzheimer's disease: Secondary | ICD-10-CM | POA: Diagnosis not present

## 2021-04-18 DIAGNOSIS — I129 Hypertensive chronic kidney disease with stage 1 through stage 4 chronic kidney disease, or unspecified chronic kidney disease: Secondary | ICD-10-CM

## 2021-04-18 DIAGNOSIS — Z0000 Encounter for general adult medical examination without abnormal findings: Secondary | ICD-10-CM

## 2021-04-18 DIAGNOSIS — F331 Major depressive disorder, recurrent, moderate: Secondary | ICD-10-CM | POA: Diagnosis not present

## 2021-04-18 DIAGNOSIS — E1122 Type 2 diabetes mellitus with diabetic chronic kidney disease: Secondary | ICD-10-CM | POA: Diagnosis not present

## 2021-04-18 DIAGNOSIS — E559 Vitamin D deficiency, unspecified: Secondary | ICD-10-CM | POA: Diagnosis not present

## 2021-04-18 DIAGNOSIS — F02B3 Dementia in other diseases classified elsewhere, moderate, with mood disturbance: Secondary | ICD-10-CM | POA: Diagnosis not present

## 2021-04-18 LAB — POCT GLYCOSYLATED HEMOGLOBIN (HGB A1C): Hemoglobin A1C: 5.5 % (ref 4.0–5.6)

## 2021-04-18 NOTE — Progress Notes (Signed)
Subjective:   Yvonne Curtis is a 70 y.o. female who presents for Medicare Annual (Subsequent) preventive examination.  Review of Systems     Cardiac Risk Factors include: advanced age (>54mn, >>5women);diabetes mellitus;dyslipidemia;hypertension;obesity (BMI >30kg/m2);sedentary lifestyle     Objective:    Today's Vitals   04/18/21 0828  BP: (!) 146/82  Pulse: 94  Resp: 16  Temp: 98.2 F (36.8 C)  TempSrc: Oral  SpO2: 97%  Weight: 190 lb (86.2 kg)  Height: 5' (1.524 m)   Body mass index is 37.11 kg/m.  Advanced Directives 04/18/2021 08/23/2020 04/17/2020 01/17/2020 03/15/2019 03/09/2018 03/04/2017  Does Patient Have a Medical Advance Directive? Yes No _0   Type of AParamed[place]of [place] will - [place] will - [place]ake of the [place]Living will [place] will [place] Does patient want to make changes to medical advance directive? - - - - - No - Patient declined Yes (Inpatient - patient defers changing a medical advance directive at this time)  Copy of [place] Chart? Yes - validated most recent copy scanned in chart (See row information) - Yes - validated most recent copy scanned in chart (See row information) - Yes - validated most recent copy scanned in chart (See row information) Yes Yes  Would patient like information on creating a medical advance directive? - No - Patient declined - - - - -    Current Medications (verified) Outpatient Encounter Medications as of 04/18/2021  Medication Sig   acetaminophen (TYLENOL) 500 MG tablet Take by mouth. (Patient not taking: Reported on 04/18/2021)   amLODipine (NORVASC) 10 MG tablet Take 1 tablet (10 mg total) by mouth daily. (Patient not taking: Reported on 04/18/2021)   atorvastatin (LIPITOR) 40 MG tablet Take 1 tablet (40 mg total) by mouth daily. (Patient not taking: Reported  on 04/18/2021)   blood glucose meter kit and supplies Dispense based on patient and insurance preference. Use once daily as directed. (FOR ICD-10 E10.9, E11.9). STrips 100 Lancets 100 (Patient not taking: Reported on 04/18/2021)   cetirizine (ZYRTEC) 10 MG tablet Take 1 tablet (10 mg total) by mouth daily as needed. (Patient not taking: Reported on 04/18/2021)   Cholecalciferol (VITAMIN D3) 2000 UNITS capsule VITAMIN D3, 2000UNIT (Oral Capsule)  2 po qday for 0 days  Quantity: 60.00;  Refills: 0   Ordered :15-May-2010  SSteele SizerMD;  SB[place] Duty3-June-2010 Active (Patient not taking: Reported on 04/18/2021)   donepezil (ARICEPT) 10 MG tablet Take by mouth. (Patient not taking: Reported on 04/18/2021)   famotidine (PEPCID) 40 MG tablet Take 1 tablet (40 mg total) by mouth at bedtime. (Patient not taking: Reported on 04/18/2021)   Lancets (ONETOUCH DELICA PLUS [id number] MISC Use as directed to check blood sugar (Patient not taking: Reported on 04/18/2021)   memantine (NAMENDA) 5 MG tablet Take 1 tablet by mouth 2 (two) times daily. (Patient not taking: Reported on 04/18/2021)   mirtazapine (REMERON) 7.5 MG tablet Take 1 tablet by mouth at bedtime. (Patient not taking: Reported on 04/18/2021)   montelukast (SINGULAIR) 10 MG tablet Take 1 tablet (10 mg total) by mouth at bedtime. (Patient not taking: Reported on 04/18/2021)   ondansetron (ZOFRAN) 4 MG tablet Take 1 tablet (4 mg total) by mouth every 8 (eight) hours as needed for nausea or vomiting. (Patient not taking: Reported on 04/18/2021)   ONETOUCH ULTRA test strip 1 each daily. Use as  directed to check blood sugar (Patient not taking: Reported on 04/18/2021)   pantoprazole (PROTONIX) 40 MG tablet Take 1 tablet (40 mg total) by mouth daily. (Patient not taking: Reported on 04/18/2021)   telmisartan-hydrochlorothiazide (MICARDIS HCT) 80-25 MG tablet Take 1 tablet by mouth daily. (Patient not taking: Reported on 04/18/2021)   No  facility-administered encounter medications on file as of 04/18/2021.    Allergies (verified) Oxycodone and Oxycodone hcl   History: Past Medical History:  Diagnosis Date   Arthritis    knees   Cataract 11/25/2017   Nuclear - OU   Diabetes mellitus without complication ([place])    Hyperlipidemia    Hyperopia, bilateral 11/25/2017   [place]. [place]   Hypertension    Obesity    Patient is Jehovah's Witness    declines blood products   Wears dentures    full upper   Past Surgical History:  Procedure Laterality Date   CHOLECYSTECTOMY     COLONOSCOPY WITH PROPOFOL N/A 11/30/2014   Procedure: COLONOSCOPY WITH PROPOFOL;  Surgeon: Lucilla Lame, MD;  Location: [facility];  Service: Endoscopy;  Laterality: N/A;  WITH BIOPSY-- TRANSVERSE COLON POLYP  X 2 DESCENING COLON POLYP   COLONOSCOPY WITH PROPOFOL N/A 01/17/2020   Procedure: COLONOSCOPY WITH PROPOFOL;  Surgeon: Lucilla Lame, MD;  Location[facility] ENDOSCOPY;  Service: Endoscopy;  Laterality: N/A;   ESOPHAGOGASTRODUODENOSCOPY (EGD) WITH PROPOFOL N/A 08/23/2020   Procedure: ESOPHAGOGASTRODUODENOSCOPY (EGD) WITH PROPOFOL;  Surgeon: Lucilla Lame, MD;  Location[facility]COPY;  Service: Endoscopy;  Laterality: N/A;   GALLBLADDER SURGERY     SEPTOPLASTY     tonsillectomy     TONSILLECTOMY     TUBAL LIGATION     Family History  Problem Relation Age of Onset   Colon cancer Mother    Lung cancer Father    Hypertension Sister    Hypertension Sister    Breast cancer Neg Hx    Social History   Socioeconomic History   Marital status: Married    Spouse name: Yvonne Curtis    Number of children: 2   Years of education: Not on file   Highest education level: 12th grade  Occupational History   Occupation: retired  Tobacco Use   Smoking status: Never   Smokeless tobacco: Never  Vaping Use   Vaping Use: Never used  Substance and Sexual Activity   Alcohol use: No    Alcohol/week: 0.0 standard drinks   Drug use: No   Sexual activity:  Yes    Partners: Male  Other Topics Concern   Not on file  Social History Narrative   Retired from [employer] , custodian   Husband also retired, but is back to work.    She dances for physical activity   She has two grandchildren, one is in college   Social Determinants of Health   Financial Resource Strain: Low Risk    Difficulty of Paying Living Expenses: Not hard at all  Food Insecurity: No Food Insecurity   Worried About Charity fundraiser in the Last Year: Never true   Arboriculturist in the Last Year: Never true  Transportation Needs: No Transportation Needs   Lack of Transportation (Medical): No   Lack of Transportation (Non-Medical): No  Physical Activity: Inactive   Days of Exercise per Week: 0 days   Minutes of Exercise per Session: 0 min  Stress: Stress Concern Present   Feeling of Stress : To some extent  Social Connections: Socially Integrated  Frequency of Communication with Friends and Family: More than three times a week   Frequency of Social Gatherings with Friends and Family: More than three times a week   Attends Religious Services: More than 4 times per year   Active Member of [employer] or Organizations: Yes   Attends Music therapist: More than 4 times per year   Marital Status: Married    Tobacco Counseling Counseling given: Not Answered   Clinical Intake:  Pre-visit preparation completed: Yes  Pain : No/denies pain     BMI - recorded: 37.11 Nutritional Status: BMI > 30  Obese Nutritional Risks: None Diabetes: Yes CBG done?: No Did pt. bring in CBG monitor from home?: No  How often do you need to have someone help you when you read instructions, pamphlets, or other written materials from your doctor or pharmacy?: 1 - Never  Nutrition Risk Assessment:  Has the patient had any N/V/D within the last 2 months?  Yes  Does the patient have any non-healing wounds?  No  Has the patient had any unintentional weight loss or  weight gain?  No   Diabetes:  Is the patient diabetic?  Yes  If diabetic, was a CBG obtained today?  No  Did the patient bring in their glucometer from home?  No  How often do you monitor your CBG's? Occasionally per patient's husband.   Financial Strains and Diabetes Management:  Are you having any financial strains with the device, your supplies or your medication? No .  Does the patient want to be seen by Chronic Care Management for management of their diabetes?  No  Would the patient like to be referred to a Nutritionist or for Diabetic Management?  No   Diabetic Exams:  Diabetic Eye Exam: Completed 12/12/20 negative retinopathy.   Diabetic Foot Exam: Completed 03/23/20. Pt has been advised about the importance in completing this exam. Pt is scheduled for diabetic foot exam on 04/18/21.    Interpreter Needed?: No  Information entered by :: Clemetine Marker LPN   Activities of Daily Living In your present state of health, do you have any difficulty performing the following activities: 04/18/2021 04/02/2021  Hearing? N N  Vision? N N  Difficulty concentrating or making decisions? Y Y  Comment - -  Walking or climbing stairs? Y Y  Dressing or bathing? Y Y  Doing errands, shopping? Tempie Donning  Preparing Food and eating ? N -  Using the Toilet? N -  In the past six months, have you accidently leaked urine? N -  Do you have problems with loss of bowel control? N -  Managing your Medications? N -  Managing your Finances? N -  Housekeeping or managing your Housekeeping? N -  Some recent data might be hidden    Patient Care Team: Steele Sizer, MD as PCP - General (Family Medicine) Eulogio Bear, MD as Consulting Physician (Ophthalmology) Anabel Bene, MD as Referring Physician (Neurology) Lyla Son, MD as Consulting Physician (Nephrology) Lonia Farber, MD as Consulting Physician (Internal Medicine)  Indicate any recent Medical Services you may have  received from other than [facility] providers in the past year (date may be approximate).     Assessment:   This is a routine wellness examination for Tomeshia.  Hearing/Vision screen Hearing Screening - Comments:: Pt denies hearing difficulty Vision Screening - Comments:: Annual vision screenings done at [facility] Dr. Edison Pace  Dietary issues and exercise activities discussed: Current Exercise Habits: The  patient does not participate in regular exercise at present, Exercise limited by: neurologic condition(s)   Goals Addressed             This Visit's Progress    DIET - INCREASE WATER INTAKE   On track    Recommend drinking 6-8 glasses of water per day        Depression Screen PHQ 2/9 Scores 04/18/2021 04/02/2021 03/25/2021 02/04/2021 12/19/2020 09/04/2020 07/24/2020  PHQ - 2 Score 4 0 _0 PHQ- 9 Score 10 0 _1 Fall Risk Fall Risk  04/18/2021 04/02/2021 03/25/2021 02/04/2021 12/19/2020  Falls in the past year? _2 Number falls in past yr: _3 Injury with Fall? 1 1 0 0 0  Risk for fall due to : History of fall(s);Impaired balance/gait Impaired balance/gait;Impaired mobility Impaired balance/gait;Impaired mobility;Mental status change History of fall(s) -  Risk for fall due to: Comment - - - - -  Follow up Falls prevention discussed - - Falls prevention discussed -    FALL RISK PREVENTION PERTAINING TO THE HOME:  Any stairs in or around the home? Yes  If so, are there any without handrails? No  Home free of loose throw rugs in walkways, pet beds, electrical cords, etc? Yes  Adequate lighting in your home to reduce risk of falls? Yes   ASSISTIVE DEVICES UTILIZED TO PREVENT FALLS:  Life alert? No  Use of a cane, walker or w/c? No  Grab bars in the bathroom? No  Shower chair or bench in shower? Yes  Elevated toilet seat or a handicapped toilet? Yes   TIMED UP AND GO:  Was the test performed? Yes .  Length of time to ambulate 10 feet: 7  sec.   Gait slow and steady without use of assistive device  Cognitive Function: Cognitive status assessed by direct observation. Patient has current diagnosis of cognitive impairment. Patient is followed by neurology for ongoing assessment. Patient is unable to complete screening 6CIT or MMSE.     MMSE - Mini Mental State Exam 08/09/2019  Orientation to time 3  Orientation to Place 5  Registration 3  Attention/ Calculation 0  Recall 3  Language- name 2 objects 2  Language- repeat 1  Language- follow 3 step command 3  Language- read & follow direction 1  Write a sentence 1  Copy design 0  Total score 22     6CIT Screen 03/15/2019 03/09/2018  What Year? 0 points 0 points  What month? 3 points 0 points  What time? 0 points 0 points  Count back from 20 0 points 0 points  Months in reverse 4 points 0 points  Repeat phrase 10 points 0 points  Total Score 17 0    Immunizations Immunization History  Administered Date(s) Administered   Influenza Nasal 02/12/2018   Influenza Split 02/23/2008   Influenza,inj,Quad PF,6+ Mos 02/09/2019, 01/23/2021   Influenza-Unspecified 01/25/2014, 01/23/2015, 01/30/2016, 02/06/2017, 01/28/2018, 01/18/2020   Moderna Sars-Covid-2 Vaccination 06/17/2019, 07/18/2019, 03/06/2020, 08/06/2020   Pneumococcal Conjugate-13 01/17/2015   Pneumococcal Polysaccharide-23 06/26/2010, 02/20/2016   Tdap 06/26/2010   Zoster Recombinat (Shingrix) 09/14/2019, 11/16/2019   Zoster, Live 03/06/2011    TDAP status: Due, Education has been provided regarding the importance of this vaccine. Advised may receive this vaccine at local pharmacy or Health Dept. Aware to provide a copy of the vaccination record if obtained from local pharmacy or Health Dept. Verbalized  acceptance and understanding.  Flu Vaccine status: Up to date  Pneumococcal vaccine status: Up to date  Covid-19 vaccine status: Completed vaccines  Qualifies for Shingles Vaccine? Yes   Zostavax completed  Yes   Shingrix Completed?: Yes  Screening Tests Health Maintenance  Topic Date Due   TETANUS/TDAP  06/26/2020   COVID-19 Vaccine (5 - Booster for Moderna series) 10/01/2020   FOOT EXAM  03/23/2021   HEMOGLOBIN A1C  06/21/2021   OPHTHALMOLOGY EXAM  12/12/2021   MAMMOGRAM  05/17/2022   COLONOSCOPY (Pts 45-21yr Insurance coverage will need to be confirmed)  01/17/2025   Pneumonia Vaccine 70 Years old  Completed   INFLUENZA VACCINE  Completed   DEXA SCAN  Completed   Hepatitis C Screening  Completed   Zoster Vaccines- Shingrix  Completed   HPV VACCINES  Aged Out    Health Maintenance  Health Maintenance Due  Topic Date Due   TETANUS/TDAP  06/26/2020   COVID-19 Vaccine (5 - Booster for Moderna series) 10/01/2020   FOOT EXAM  03/23/2021    Colorectal cancer screening: Type of screening: Colonoscopy. Completed 01/18/20. Repeat every 5 years  Mammogram status: Completed 05/17/20. Repeat every year. Scheduled for 05/21/21  Bone Density status: Completed 02/11/21. Results reflect: Bone density results: NORMAL. Repeat every 2 years.  Lung Cancer Screening: (Low Dose CT Chest recommended if Age 71-80years, 30 pack-year currently smoking OR have quit w/in 15years.) does not qualify.   Additional Screening:  Hepatitis C Screening: does qualify; Completed 01/30/12  Vision Screening: Recommended annual ophthalmology exams for early detection of glaucoma and other disorders of the eye. Is the patient up to date with their annual eye exam?  Yes  Who is the provider or what is the name of the office in which the patient attends annual eye exams? Dr. KEdison Pace   Dental Screening: Recommended annual dental exams for proper oral hygiene  Community Resource Referral / Chronic Care Management: CRR required this visit?  No   CCM required this visit?  No      Plan:     I have personally reviewed and noted the following in the patients chart:   Medical and social history Use of alcohol,  tobacco or illicit drugs  Current medications and supplements including opioid prescriptions.  Functional ability and status Nutritional status Physical activity Advanced directives List of other physicians Hospitalizations, surgeries, and ER visits in previous 12 months Vitals Screenings to include cognitive, depression, and falls Referrals and appointments  In addition, I have reviewed and discussed with patient certain preventive protocols, quality metrics, and best practice recommendations. A written personalized care plan for preventive services as well as general preventive health recommendations were provided to patient.     KClemetine Marker LPN   [id number]  Nurse Notes: pt accompanied to visit today by her husband DShanon Browwho states she stopped taking all medications approx 1 month ago. Pt to see Dr. SAncil Boozertoday. Pt also c/o feeling cold all the time; seen by endocrinology 01/22/21.

## 2021-04-18 NOTE — Patient Instructions (Signed)
Yvonne Curtis , Thank you for taking time to come for your Medicare Wellness Visit. I appreciate your ongoing commitment to your health goals. Please review the following plan we discussed and let me know if I can assist you in the future.   Screening recommendations/referrals: Colonoscopy: done 01/18/20. Repeat 01/2025 Mammogram: done 05/17/20; scheduled for 05/21/21 Bone Density: done 02/11/21 Recommended yearly ophthalmology/optometry visit for glaucoma screening and checkup Recommended yearly dental visit for hygiene and checkup  Vaccinations: Influenza vaccine: done 01/23/21 Pneumococcal vaccine: done 02/20/16 Tdap vaccine: due Shingles vaccine: done 09/14/19 & 11/16/19   Covid-19:done 06/17/19, 07/18/19, 03/06/20 & 08/06/20  Conditions/risks identified: Recommend fall prevention in the home  Next appointment: Follow up in one year for your annual wellness visit    Preventive Care 91 Years and Older, Female Preventive care refers to lifestyle choices and visits with your health care provider that can promote health and wellness. What does preventive care include? A yearly physical exam. This is also called an annual well check. Dental exams once or twice a year. Routine eye exams. Ask your health care provider how often you should have your eyes checked. Personal lifestyle choices, including: Daily care of your teeth and gums. Regular physical activity. Eating a healthy diet. Avoiding tobacco and drug use. Limiting alcohol use. Practicing safe sex. Taking low-dose aspirin every day. Taking vitamin and mineral supplements as recommended by your health care provider. What happens during an annual well check? The services and screenings done by your health care provider during your annual well check will depend on your age, overall health, lifestyle risk factors, and family history of disease. Counseling  Your health care provider may ask you questions about your: Alcohol use. Tobacco  use. Drug use. Emotional well-being. Home and relationship well-being. Sexual activity. Eating habits. History of falls. Memory and ability to understand (cognition). Work and work Statistician. Reproductive health. Screening  You may have the following tests or measurements: Height, weight, and BMI. Blood pressure. Lipid and cholesterol levels. These may be checked every 5 years, or more frequently if you are over 59 years old. Skin check. Lung cancer screening. You may have this screening every year starting at age 83 if you have a 30-pack-year history of smoking and currently smoke or have quit within the past 15 years. Fecal occult blood test (FOBT) of the stool. You may have this test every year starting at age 50. Flexible sigmoidoscopy or colonoscopy. You may have a sigmoidoscopy every 5 years or a colonoscopy every 10 years starting at age 61. Hepatitis C blood test. Hepatitis B blood test. Sexually transmitted disease (STD) testing. Diabetes screening. This is done by checking your blood sugar (glucose) after you have not eaten for a while (fasting). You may have this done every 1-3 years. Bone density scan. This is done to screen for osteoporosis. You may have this done starting at age 11. Mammogram. This may be done every 1-2 years. Talk to your health care provider about how often you should have regular mammograms. Talk with your health care provider about your test results, treatment options, and if necessary, the need for more tests. Vaccines  Your health care provider may recommend certain vaccines, such as: Influenza vaccine. This is recommended every year. Tetanus, diphtheria, and acellular pertussis (Tdap, Td) vaccine. You may need a Td booster every 10 years. Zoster vaccine. You may need this after age 33. Pneumococcal 13-valent conjugate (PCV13) vaccine. One dose is recommended after age 46. Pneumococcal polysaccharide (PPSV23) vaccine.  One dose is recommended  after age 49. Talk to your health care provider about which screenings and vaccines you need and how often you need them. This information is not intended to replace advice given to you by your health care provider. Make sure you discuss any questions you have with your health care provider. Document Released: 05/18/2015 Document Revised: 01/09/2016 Document Reviewed: 02/20/2015 Elsevier Interactive Patient Education  2017 [employer] Prevention in the Home Falls can cause injuries. They can happen to people of all ages. There are many things you can do to make your home safe and to help prevent falls. What can I do on the outside of my home? Regularly fix the edges of walkways and driveways and fix any cracks. Remove anything that might make you trip as you walk through a door, such as a raised step or threshold. Trim any bushes or trees on the path to your home. Use bright outdoor lighting. Clear any walking paths of anything that might make someone trip, such as rocks or tools. Regularly check to see if handrails are loose or broken. Make sure that both sides of any steps have handrails. Any raised decks and porches should have guardrails on the edges. Have any leaves, snow, or ice cleared regularly. Use sand or salt on walking paths during winter. Clean up any spills in your garage right away. This includes oil or grease spills. What can I do in the bathroom? Use night lights. Install grab bars by the toilet and in the tub and shower. Do not use towel bars as grab bars. Use non-skid mats or decals in the tub or shower. If you need to sit down in the shower, use a plastic, non-slip stool. Keep the floor dry. Clean up any water that spills on the floor as soon as it happens. Remove soap buildup in the tub or shower regularly. Attach bath mats securely with double-sided non-slip rug tape. Do not have throw rugs and other things on the floor that can make you trip. What can I do  in the bedroom? Use night lights. Make sure that you have a light by your bed that is easy to reach. Do not use any sheets or blankets that are too big for your bed. They should not hang down onto the floor. Have a firm chair that has side arms. You can use this for support while you get dressed. Do not have throw rugs and other things on the floor that can make you trip. What can I do in the kitchen? Clean up any spills right away. Avoid walking on wet floors. Keep items that you use a lot in easy-to-reach places. If you need to reach something above you, use a strong step stool that has a grab bar. Keep electrical cords out of the way. Do not use floor polish or wax that makes floors slippery. If you must use wax, use non-skid floor wax. Do not have throw rugs and other things on the floor that can make you trip. What can I do with my stairs? Do not leave any items on the stairs. Make sure that there are handrails on both sides of the stairs and use them. Fix handrails that are broken or loose. Make sure that handrails are as long as the stairways. Check any carpeting to make sure that it is firmly attached to the stairs. Fix any carpet that is loose or worn. Avoid having throw rugs at the top or bottom of  the stairs. If you do have throw rugs, attach them to the floor with carpet tape. Make sure that you have a light switch at the top of the stairs and the bottom of the stairs. If you do not have them, ask someone to add them for you. What else can I do to help prevent falls? Wear shoes that: Do not have high heels. Have rubber bottoms. Are comfortable and fit you well. Are closed at the toe. Do not wear sandals. If you use a stepladder: Make sure that it is fully opened. Do not climb a closed stepladder. Make sure that both sides of the stepladder are locked into place. Ask someone to hold it for you, if possible. Clearly mark and make sure that you can see: Any grab bars or  handrails. First and last steps. Where the edge of each step is. Use tools that help you move around (mobility aids) if they are needed. These include: Canes. Walkers. Scooters. Crutches. Turn on the lights when you go into a dark area. Replace any light bulbs as soon as they burn out. Set up your furniture so you have a clear path. Avoid moving your furniture around. If any of your floors are uneven, fix them. If there are any pets around you, be aware of where they are. Review your medicines with your doctor. Some medicines can make you feel dizzy. This can increase your chance of falling. Ask your doctor what other things that you can do to help prevent falls. This information is not intended to replace advice given to you by your health care provider. Make sure you discuss any questions you have with your health care provider. Document Released: 02/15/2009 Document Revised: 09/27/2015 Document Reviewed: 05/26/2014 Elsevier Interactive Patient Education  2017 [employer].

## 2021-04-30 ENCOUNTER — Telehealth: Payer: Self-pay

## 2021-04-30 NOTE — Telephone Encounter (Signed)
Copied from [place] [id number]. Topic: General - Other >> Apr 30, 2021  3:52 PM McGill, Nelva Bush wrote: Reason for CRM: RN Nou, from [facility] requesting a call back regarding pt overall health status.  Stated she reviewed PCP [facility], comments from last appointment 04/18/21.   Please advise.

## 2021-05-07 ENCOUNTER — Other Ambulatory Visit: Payer: Self-pay

## 2021-05-07 DIAGNOSIS — R630 Anorexia: Secondary | ICD-10-CM

## 2021-05-07 DIAGNOSIS — R1033 Periumbilical pain: Secondary | ICD-10-CM

## 2021-05-21 ENCOUNTER — Ambulatory Visit
Admission: RE | Admit: 2021-05-21 | Discharge: 2021-05-21 | Disposition: A | Discharge status: Home / Self Care | Payer: HMO | Source: Ambulatory Visit | Attending: Family Medicine | Admitting: Family Medicine

## 2021-05-21 ENCOUNTER — Other Ambulatory Visit: Payer: Self-pay

## 2021-05-21 DIAGNOSIS — Z1231 Encounter for screening mammogram for malignant neoplasm of breast: Secondary | ICD-10-CM | POA: Insufficient documentation

## 2021-06-03 ENCOUNTER — Telehealth: Payer: Self-pay

## 2021-06-03 ENCOUNTER — Other Ambulatory Visit: Payer: Self-pay

## 2021-06-03 DIAGNOSIS — F331 Major depressive disorder, recurrent, moderate: Secondary | ICD-10-CM

## 2021-06-03 DIAGNOSIS — F02B3 Dementia in other diseases classified elsewhere, moderate, with mood disturbance: Secondary | ICD-10-CM

## 2021-06-03 DIAGNOSIS — R630 Anorexia: Secondary | ICD-10-CM

## 2021-06-03 DIAGNOSIS — F418 Other specified anxiety disorders: Secondary | ICD-10-CM

## 2021-06-03 DIAGNOSIS — F09 Unspecified mental disorder due to known physiological condition: Secondary | ICD-10-CM

## 2021-06-03 DIAGNOSIS — E1122 Type 2 diabetes mellitus with diabetic chronic kidney disease: Secondary | ICD-10-CM

## 2021-06-03 DIAGNOSIS — I129 Hypertensive chronic kidney disease with stage 1 through stage 4 chronic kidney disease, or unspecified chronic kidney disease: Secondary | ICD-10-CM

## 2021-06-03 DIAGNOSIS — E440 Moderate protein-calorie malnutrition: Secondary | ICD-10-CM

## 2021-06-03 DIAGNOSIS — R413 Other amnesia: Secondary | ICD-10-CM

## 2021-06-03 NOTE — Telephone Encounter (Signed)
Spoke with patient's husband [name] and scheduled a Televisit Palliative Consult for 06/04/21 @ 1:30 PM.  Consent obtained; updated Outlook/Netsmart/Team List and Epic.

## 2021-06-04 ENCOUNTER — Other Ambulatory Visit: Payer: HMO | Admitting: Student

## 2021-06-04 ENCOUNTER — Other Ambulatory Visit: Payer: Self-pay

## 2021-06-04 DIAGNOSIS — F0283 Dementia in other diseases classified elsewhere, unspecified severity, with mood disturbance: Secondary | ICD-10-CM

## 2021-06-04 DIAGNOSIS — F32A Depression, unspecified: Secondary | ICD-10-CM

## 2021-06-04 DIAGNOSIS — Z515 Encounter for palliative care: Secondary | ICD-10-CM

## 2021-06-04 DIAGNOSIS — R630 Anorexia: Secondary | ICD-10-CM

## 2021-06-04 DIAGNOSIS — E1122 Type 2 diabetes mellitus with diabetic chronic kidney disease: Secondary | ICD-10-CM

## 2021-06-04 DIAGNOSIS — F419 Anxiety disorder, unspecified: Secondary | ICD-10-CM

## 2021-06-04 DIAGNOSIS — G309 Alzheimer's disease, unspecified: Secondary | ICD-10-CM

## 2021-06-04 NOTE — Progress Notes (Signed)
[facility] Consult Note Telephone: [phone]  Fax: [phone]   Date of encounter: 06/04/21 3:35 PM PATIENT NAME: Yvonne Curtis [address] [place] [address]   [id number] (home)  DOB: 03-11-1951 MRN: [id number] PRIMARY CARE PROVIDER:    Steele Sizer, MD,  [address] [place] [address] [phone]  REFERRING PROVIDER:   Steele Sizer, [place],  [place] [address] [phone]  RESPONSIBLE PARTY:    Contact Information     Name Relation Home Work Mobile   Yvonne Curtis [phone]  [phone]   Yvonne Curtis Daughter [phone]         Due to the COVID-19 crisis, this home visit was done via telephone due to the patient's inability to connect via an audiovisual connection or their refusal to have an in-person visit. This connection was agreed to by the patient. Verified that I am speaking with the correct person using two identifiers. Also spoke with daughter Yvonne Curtis via telephone.                                     ASSESSMENT AND PLAN / RECOMMENDATIONS:   Advance Care Planning/Goals of Care: Goals include to maximize quality of life and symptom management. Patient/health care surrogate gave his/her permission to discuss.Our advance care planning conversation included a discussion about:    The value and importance of advance care planning  Experiences with loved ones who have been seriously ill or have died  Exploration of personal, cultural or spiritual beliefs that might influence medical decisions  Exploration of goals of care in the event of a sudden injury or illness  Introduced MOST form. CODE STATUS: Full Code  Education provided on palliative medicine versus Hospice services. Patience husband would like to keep patient in the home. He denies any need for caregivers in the home at this time. Discuss long range planning as  patient declines, need for additional caregiver support in the home. Patients husband is a English as a second language teacher. Briefly discussed Aide in attendance as ano option; family to see if wife would qualify as his spouse. Palliative medicine will continue to provide ongoing support.  Symptom Management/Plan:  Alzheimer's dementia-recent cognitive decline reported. Patient with increased confusion, sun downs, increased agitation and anxiety. Continue Aricept and namenda as directed. Reorient and redirect as needed. Monitor for falls/safety. Monitor for further cognitive, functional declines.   Depression and anxiety-patient more tearful, increased crying spells. Recommend sertraline 25 mg daily; script sent to pharmacy. Continue mirtazapine 7.5 mg QHS.   Decreased Appetite-patient with varied appetite; eats good some days and poorly other days. Continue mirtazapine 7.5 mg QHS, nutritional supplement 1-2 daily. Offer foods patient enjoys.   T2DM-A1C 5.5; no longer on metformin or other diabetes medications. Husband checks blood sugar weekly; usually around 100 mg/dL.   Follow up Palliative Care Visit: Palliative care will continue to follow for complex medical decision making, advance care planning, and clarification of goals. Return in 4 weeks or prn.   This visit was coded based on medical decision making (MDM).  PPS: 50%  HOSPICE ELIGIBILITY/DIAGNOSIS: TBD  Chief Complaint: Palliative Medicine initial consult.   HISTORY OF PRESENT ILLNESS:  Yvonne Curtis is a 71 y.o. year old female  with Alzheimer's dementia, depression with anxiety, type 2 diabetes, hypertension, CKD 3, protein calorie malnutrition, GERD.   Patient resides at home with husband.  Husband reports patient has days where she does better, reports having some bad days. Discussed with daughter via telephone and she reports cognitive decline and changes worse in the past two weeks. Patient sun downs, increased agitation and anxiety in the  evening. She has had increased crying spells. Her sleep varies; She has slept well the past three nights per husband. She does require some assistance with adls such as dressing. She is still continent of bowel and bladder at the present time. She denies pain at present. Husband states patient has upcoming GI follow up. She had been complaining of abdominal pain. Patient states that abdominal pain has improved and is worse when eating greasy foods. She is taking Protonix. Appetite is fair overall. She is drinking one to two protein supplements a day. No recent ED visits or hospitalizations.   History obtained from review of EMR, discussion with primary team, and interview with family, facility staff/caregiver and/or Ms. Kroenke.  I reviewed available labs, medications, imaging, studies and related documents from the EMR.  Records reviewed and summarized above.   ROS  General: NAD EYES: denies vision changes ENMT: denies dysphagia Cardiovascular: denies chest pain, denies DOE Pulmonary: denies cough, denies increased SOB Abdomen: denies constipation, endorses continence of bowel GU: denies dysuria, endorses continence of urine MSK:  denies increased weakness,  no falls reported Skin: denies rashes or wounds Neurological: denies pain, sleep varies Psych: Endorses depressed mood Heme/lymph/immuno: denies bruises, abnormal bleeding  Physical Exam:  PE deferred due to this being a Tele health visit.    CURRENT PROBLEM LIST:  Patient Active Problem List   Diagnosis Date Noted   Anemia in chronic kidney disease 02/07/2021   Pure hypercholesterolemia, unspecified 02/07/2021   Hypercalcemia 10/11/2020   Chronic kidney disease, stage 3b ([facility]) 09/04/2020   Dysphagia    History of colonic polyps    Polyp of transverse colon    Abnormal sense of taste 12/19/2019   Loss of memory 12/19/2019   Mood changes 12/19/2019   Acanthosis nigricans 11/14/2014   Benign hypertension 11/14/2014    Dyslipidemia 11/14/2014   Gastro-esophageal reflux disease without esophagitis 11/14/2014   Extreme obesity 11/14/2014   Primary osteoarthritis of both knees 11/14/2014   Microalbuminuria 10/15/2012   Controlled type 2 diabetes mellitus with microalbuminuria ([facility]) 06/05/2009   Body dermatophytosis 12/13/2008   PAST MEDICAL HISTORY:  Active Ambulatory Problems    Diagnosis Date Noted   Acanthosis nigricans 11/14/2014   Benign hypertension 11/14/2014   Body dermatophytosis 12/13/2008   Dyslipidemia 11/14/2014   Gastro-esophageal reflux disease without esophagitis 11/14/2014   Microalbuminuria 10/15/2012   Extreme obesity 11/14/2014   Primary osteoarthritis of both knees 11/14/2014   Controlled type 2 diabetes mellitus with microalbuminuria ([facility]) 06/05/2009   Abnormal sense of taste 12/19/2019   Loss of memory 12/19/2019   Mood changes 12/19/2019   History of colonic polyps    Polyp of transverse colon    Dysphagia    Chronic kidney disease, stage 3b ([facility]) 09/04/2020   Hypercalcemia 10/11/2020   Anemia in chronic kidney disease 02/07/2021   Pure hypercholesterolemia, unspecified 02/07/2021   Resolved Ambulatory Problems    Diagnosis Date Noted   Hematuria 02/17/2015   Past Medical History:  Diagnosis Date   Arthritis    Cataract 11/25/2017   Diabetes mellitus without complication (HCC)    Hyperlipidemia    Hyperopia, bilateral 11/25/2017   Hypertension    Obesity    Patient is Jehovah's Witness    Wears dentures  SOCIAL HX:  Social History   Tobacco Use   Smoking status: Never   Smokeless tobacco: Never  Substance Use Topics   Alcohol use: No    Alcohol/week: 0.0 standard drinks   FAMILY HX:  Family History  Problem Relation Age of Onset   Colon cancer Mother    Lung cancer Father    Hypertension Sister    Hypertension Sister    Breast cancer Neg Hx       ALLERGIES:  Allergies  Allergen Reactions   Oxycodone Other (See Comments)   Oxycodone Hcl      insomnia, agitation     PERTINENT MEDICATIONS:  Outpatient Encounter Medications as of 06/04/2021  Medication Sig   acetaminophen (TYLENOL) 500 MG tablet Take by mouth.   amLODipine (NORVASC) 10 MG tablet Take 1 tablet (10 mg total) by mouth daily.   atorvastatin (LIPITOR) 40 MG tablet Take 1 tablet (40 mg total) by mouth daily.   cetirizine (ZYRTEC) 10 MG tablet Take 1 tablet (10 mg total) by mouth daily as needed.   Cholecalciferol (VITAMIN D3) 2000 UNITS capsule    donepezil (ARICEPT) 10 MG tablet Take by mouth.   memantine (NAMENDA) 5 MG tablet Take 1 tablet by mouth 2 (two) times daily.   mirtazapine (REMERON) 15 MG tablet Take 1 tablet by mouth at bedtime.   pantoprazole (PROTONIX) 40 MG tablet Take 1 tablet (40 mg total) by mouth daily.   telmisartan (MICARDIS) 80 MG tablet Take 80 mg by mouth daily.   blood glucose meter kit and supplies Dispense based on patient and insurance preference. Use once daily as directed. (FOR ICD-10 E10.9, E11.9). STrips 100 Lancets 100 (Patient not taking: Reported on 04/18/2021)   famotidine (PEPCID) 40 MG tablet Take 1 tablet (40 mg total) by mouth at bedtime. (Patient not taking: Reported on 04/18/2021)   Lancets (ONETOUCH DELICA PLUS [id number]) MISC Use as directed to check blood sugar (Patient not taking: Reported on 04/18/2021)   montelukast (SINGULAIR) 10 MG tablet Take 1 tablet (10 mg total) by mouth at bedtime. (Patient not taking: Reported on 04/18/2021)   ondansetron (ZOFRAN) 4 MG tablet Take 1 tablet (4 mg total) by mouth every 8 (eight) hours as needed for nausea or vomiting. (Patient not taking: Reported on 04/18/2021)   ONETOUCH ULTRA test strip 1 each daily. Use as directed to check blood sugar (Patient not taking: Reported on 04/18/2021)   No facility-administered encounter medications on file as of 06/04/2021.   Thank you for the opportunity to participate in the care of Yvonne Curtis.  The palliative care team will continue to follow.  Please call our office at [phone] if we can be of additional assistance.   Ezekiel Slocumb, NP   COVID-19 PATIENT SCREENING TOOL  Asked and negative response unless otherwise noted:  Have you had symptoms of covid, tested positive or been in contact with someone with symptoms/positive test in the past 5-10 days? No

## 2021-06-13 ENCOUNTER — Other Ambulatory Visit: Payer: Self-pay

## 2021-06-13 ENCOUNTER — Encounter: Payer: Self-pay | Admitting: Gastroenterology

## 2021-06-13 ENCOUNTER — Ambulatory Visit (INDEPENDENT_AMBULATORY_CARE_PROVIDER_SITE_OTHER): Payer: HMO | Admitting: Gastroenterology

## 2021-06-13 ENCOUNTER — Other Ambulatory Visit: Payer: Self-pay | Admitting: Gastroenterology

## 2021-06-13 VITALS — BP 136/88 | HR 125 | Temp 97.7°F | Ht 60.0 in | Wt 186.6 lb

## 2021-06-13 DIAGNOSIS — R634 Abnormal weight loss: Secondary | ICD-10-CM | POA: Diagnosis not present

## 2021-06-13 DIAGNOSIS — R1033 Periumbilical pain: Secondary | ICD-10-CM | POA: Diagnosis not present

## 2021-06-13 NOTE — Patient Instructions (Addendum)
Your CT scan has been scheduled for June 26, 2021, you must arrive at 8:30AM.   You cannot have anything to drink 4 hours prior to procedure.   You must go to pick up prep to drink 2 days prior to your appointment.  If you need to cancel or reschedule your CT scan, please call [phone].

## 2021-06-13 NOTE — Progress Notes (Signed)
Primary Care Physician: Alba Cory, MD  Primary Gastroenterologist:  Dr. Midge Minium  Chief Complaint  Patient presents with   New Patient (Initial Visit)   Abdominal Pain    HPI: Yvonne Curtis is a 71 y.o. female here with a history of seeing me in the past for dysphagia.  The patient had an upper endoscopy in April 2022 by me without any findings to explain the dysphagia.  The patient also had a colonoscopy by me with 2 small polyps in 2021.  Patient has a history of Alzheimer's dementia with depression and anxiety.  She also has been seen recently by palliative care medicine.  At the end of last year the patient saw her primary care provider and it was reported at that time that the patient had had abdominal pain but it was months prior and no symptoms at the end of last year at that office visit.  The patient had also been reported to have lost 37 pounds in the last year. The patient reports that her abdominal pain is periumbilical and is not really related to any GI function such as eating drinking or change in bowel habits.  The patient also reports that it happens sporadically.  Past Medical History:  Diagnosis Date   Arthritis    knees   Cataract 11/25/2017   Nuclear - OU   Diabetes mellitus without complication (HCC)    Hyperlipidemia    Hyperopia, bilateral 11/25/2017   [facility] [employer] [place]   Hypertension    Obesity    Patient is [employer]    declines blood products   Wears dentures    full upper    Current Outpatient Medications  Medication Sig Dispense Refill   acetaminophen (TYLENOL) 500 MG tablet Take by mouth.     amLODipine (NORVASC) 10 MG tablet Take 1 tablet (10 mg total) by mouth daily. 90 tablet 1   atorvastatin (LIPITOR) 40 MG tablet Take 1 tablet (40 mg total) by mouth daily. 90 tablet 1   blood glucose meter kit and supplies Dispense based on patient and insurance preference. Use once daily as directed. (FOR ICD-10 E10.9, E11.9). STrips  100 Lancets 100 (Patient not taking: Reported on 04/18/2021) 1 each 0   cetirizine (ZYRTEC) 10 MG tablet Take 1 tablet (10 mg total) by mouth daily as needed. 90 tablet 1   Cholecalciferol (VITAMIN D3) 2000 UNITS capsule      donepezil (ARICEPT) 10 MG tablet Take by mouth.     famotidine (PEPCID) 40 MG tablet Take 1 tablet (40 mg total) by mouth at bedtime. (Patient not taking: Reported on 04/18/2021) 30 tablet 0   Lancets (ONETOUCH DELICA PLUS LANCET33G) MISC Use as directed to check blood sugar (Patient not taking: Reported on 04/18/2021)     memantine (NAMENDA) 5 MG tablet Take 1 tablet by mouth 2 (two) times daily.     mirtazapine (REMERON) 15 MG tablet Take 1 tablet by mouth at bedtime.     montelukast (SINGULAIR) 10 MG tablet Take 1 tablet (10 mg total) by mouth at bedtime. (Patient not taking: Reported on 04/18/2021) 90 tablet 1   ondansetron (ZOFRAN) 4 MG tablet Take 1 tablet (4 mg total) by mouth every 8 (eight) hours as needed for nausea or vomiting. (Patient not taking: Reported on 04/18/2021) 20 tablet 0   ONETOUCH ULTRA test strip 1 each daily. Use as directed to check blood sugar (Patient not taking: Reported on 04/18/2021)     pantoprazole (PROTONIX) 40  MG tablet Take 1 tablet (40 mg total) by mouth daily. 30 tablet 11   telmisartan (MICARDIS) 80 MG tablet Take 80 mg by mouth daily.     No current facility-administered medications for this visit.    Allergies as of 06/13/2021 - Review Complete 04/18/2021  Allergen Reaction Noted   Oxycodone Other (See Comments) 11/17/2014   Oxycodone hcl  11/14/2014    ROS:  General: Negative for anorexia, weight loss, fever, chills, fatigue, weakness. ENT: Negative for hoarseness, difficulty swallowing , nasal congestion. CV: Negative for chest pain, angina, palpitations, dyspnea on exertion, peripheral edema.  Respiratory: Negative for dyspnea at rest, dyspnea on exertion, cough, sputum, wheezing.  GI: See history of present  illness. GU:  Negative for dysuria, hematuria, urinary incontinence, urinary frequency, nocturnal urination.  Endo: Negative for unusual weight change.    Physical Examination:   BP 136/88    Pulse (!) 125    Temp 97.7 F (36.5 C) (Oral)    Wt 186 lb 9.6 oz (84.6 kg)    BMI 36.44 kg/m   General: Well-nourished, well-developed in no acute distress.  Eyes: No icterus. Conjunctivae pink. Lungs: Clear to auscultation bilaterally. Non-labored. Heart: Regular rate and rhythm, no murmurs rubs or gallops.  Abdomen: Bowel sounds are normal, nontender, nondistended, no hepatosplenomegaly or masses, no abdominal bruits or hernia , no rebound or guarding.   Extremities: No lower extremity edema. No clubbing or deformities. Neuro: Alert and oriented x 3.  Grossly intact. Skin: Warm and dry, no jaundice.   Psych: Alert and cooperative, normal mood and affect.  Labs:    Imaging Studies: MM 3D SCREEN BREAST BILATERAL  Result Date: 05/21/2021 CLINICAL DATA:  Screening. EXAM: DIGITAL SCREENING BILATERAL MAMMOGRAM WITH TOMOSYNTHESIS AND CAD TECHNIQUE: Bilateral screening digital craniocaudal and mediolateral oblique mammograms were obtained. Bilateral screening digital breast tomosynthesis was performed. The images were evaluated with computer-aided detection. COMPARISON:  Previous exam(s). ACR Breast Density Category b: There are scattered areas of fibroglandular density. FINDINGS: There are no findings suspicious for malignancy. IMPRESSION: No mammographic evidence of malignancy. A result letter of this screening mammogram will be mailed directly to the patient. RECOMMENDATION: Screening mammogram in one year. (Code:SM-B-01Y) BI-RADS CATEGORY  1: Negative. Electronically Signed   By: Abelardo Diesel M.D.   On: 05/21/2021 11:33   Assessment and Plan:   Yvonne Curtis is a 71 y.o. y/o female who comes in today with abdominal pain in the periumbilical area.  The patient had no findings on her EGD or  colonoscopy to explain any of her symptoms.  The patient has been gaining weight again and is questionable whether this pain is GI in nature at all.  The patient will be set up for CT scan of the abdomen pelvis because of her abdominal pain and inability to give a full history with her dementia.  The patient and her family have been explained the plan and agree with it.     Lucilla Lame, MD. Marval Regal    Note: This dictation was prepared with Dragon dictation along with smaller phrase technology. Any transcriptional errors that result from this process are unintentional.

## 2021-06-14 LAB — BASIC METABOLIC PANEL
BUN/Creatinine Ratio: 6 — ABNORMAL LOW (ref 12–28)
BUN: 8 mg/dL (ref 8–27)
CO2: 24 mmol/L (ref 20–29)
Calcium: 10.1 mg/dL (ref 8.7–10.3)
Chloride: 100 mmol/L (ref 96–106)
Creatinine, Ser: 1.33 mg/dL — ABNORMAL HIGH (ref 0.57–1.00)
Glucose: 124 mg/dL — ABNORMAL HIGH (ref 70–99)
Potassium: 3.1 mmol/L — ABNORMAL LOW (ref 3.5–5.2)
Sodium: 143 mmol/L (ref 134–144)
eGFR: 43 mL/min/{1.73_m2} — ABNORMAL LOW (ref >59)

## 2021-06-17 DIAGNOSIS — R4586 Emotional lability: Secondary | ICD-10-CM | POA: Diagnosis not present

## 2021-06-17 DIAGNOSIS — R432 Parageusia: Secondary | ICD-10-CM | POA: Diagnosis not present

## 2021-06-17 DIAGNOSIS — E1122 Type 2 diabetes mellitus with diabetic chronic kidney disease: Secondary | ICD-10-CM | POA: Diagnosis not present

## 2021-06-17 DIAGNOSIS — R413 Other amnesia: Secondary | ICD-10-CM | POA: Diagnosis not present

## 2021-06-17 DIAGNOSIS — G308 Other Alzheimer's disease: Secondary | ICD-10-CM | POA: Diagnosis not present

## 2021-06-17 DIAGNOSIS — F02B3 Dementia in other diseases classified elsewhere, moderate, with mood disturbance: Secondary | ICD-10-CM | POA: Diagnosis not present

## 2021-06-26 ENCOUNTER — Ambulatory Visit
Admission: RE | Admit: 2021-06-26 | Discharge: 2021-06-26 | Disposition: A | Discharge status: Home / Self Care | Payer: HMO | Source: Ambulatory Visit | Attending: Gastroenterology | Admitting: Gastroenterology

## 2021-06-26 DIAGNOSIS — R1033 Periumbilical pain: Secondary | ICD-10-CM | POA: Diagnosis not present

## 2021-06-26 DIAGNOSIS — K5730 Diverticulosis of large intestine without perforation or abscess without bleeding: Secondary | ICD-10-CM | POA: Diagnosis not present

## 2021-06-26 DIAGNOSIS — I700 Atherosclerosis of aorta: Secondary | ICD-10-CM | POA: Diagnosis not present

## 2021-06-26 DIAGNOSIS — N2889 Other specified disorders of kidney and ureter: Secondary | ICD-10-CM | POA: Diagnosis not present

## 2021-06-26 DIAGNOSIS — R634 Abnormal weight loss: Secondary | ICD-10-CM | POA: Diagnosis not present

## 2021-06-26 MED ORDER — IOHEXOL 300 MG/ML  SOLN
80.0000 mL | Freq: Once | INTRAMUSCULAR | Status: AC | PRN
  Administered 2021-06-26: 80 mL via INTRAVENOUS

## 2021-06-28 ENCOUNTER — Telehealth: Payer: Self-pay

## 2021-06-28 NOTE — Telephone Encounter (Signed)
Patient husband is calling about the result of CT scan that she had done today

## 2021-07-03 DIAGNOSIS — E0590 Thyrotoxicosis, unspecified without thyrotoxic crisis or storm: Secondary | ICD-10-CM | POA: Diagnosis not present

## 2021-07-03 DIAGNOSIS — E049 Nontoxic goiter, unspecified: Secondary | ICD-10-CM | POA: Diagnosis not present

## 2021-07-04 ENCOUNTER — Other Ambulatory Visit: Payer: Self-pay

## 2021-07-04 ENCOUNTER — Other Ambulatory Visit: Payer: HMO | Admitting: Student

## 2021-07-04 DIAGNOSIS — N281 Cyst of kidney, acquired: Secondary | ICD-10-CM | POA: Diagnosis not present

## 2021-07-04 DIAGNOSIS — E1129 Type 2 diabetes mellitus with other diabetic kidney complication: Secondary | ICD-10-CM | POA: Diagnosis not present

## 2021-07-04 DIAGNOSIS — F03918 Unspecified dementia, unspecified severity, with other behavioral disturbance: Secondary | ICD-10-CM | POA: Diagnosis not present

## 2021-07-04 DIAGNOSIS — N1832 Chronic kidney disease, stage 3b: Secondary | ICD-10-CM | POA: Diagnosis not present

## 2021-07-04 DIAGNOSIS — D631 Anemia in chronic kidney disease: Secondary | ICD-10-CM | POA: Diagnosis not present

## 2021-07-04 DIAGNOSIS — F03911 Unspecified dementia, unspecified severity, with agitation: Secondary | ICD-10-CM

## 2021-07-04 DIAGNOSIS — R801 Persistent proteinuria, unspecified: Secondary | ICD-10-CM | POA: Diagnosis not present

## 2021-07-04 DIAGNOSIS — R809 Proteinuria, unspecified: Secondary | ICD-10-CM | POA: Diagnosis not present

## 2021-07-04 DIAGNOSIS — R109 Unspecified abdominal pain: Secondary | ICD-10-CM | POA: Diagnosis not present

## 2021-07-04 DIAGNOSIS — E1122 Type 2 diabetes mellitus with diabetic chronic kidney disease: Secondary | ICD-10-CM | POA: Diagnosis not present

## 2021-07-04 DIAGNOSIS — L989 Disorder of the skin and subcutaneous tissue, unspecified: Secondary | ICD-10-CM

## 2021-07-04 DIAGNOSIS — Z515 Encounter for palliative care: Secondary | ICD-10-CM

## 2021-07-04 DIAGNOSIS — E7800 Pure hypercholesterolemia, unspecified: Secondary | ICD-10-CM | POA: Diagnosis not present

## 2021-07-04 DIAGNOSIS — I10 Essential (primary) hypertension: Secondary | ICD-10-CM | POA: Diagnosis not present

## 2021-07-04 DIAGNOSIS — F32A Depression, unspecified: Secondary | ICD-10-CM | POA: Diagnosis not present

## 2021-07-04 DIAGNOSIS — F419 Anxiety disorder, unspecified: Secondary | ICD-10-CM | POA: Diagnosis not present

## 2021-07-04 DIAGNOSIS — E785 Hyperlipidemia, unspecified: Secondary | ICD-10-CM | POA: Diagnosis not present

## 2021-07-04 NOTE — Progress Notes (Signed)
? ? ?Manufacturing [facility] ?Community Palliative Care Consult Note ?Telephone: [phone]  ?Fax: [phone]  ? ? ?Date of encounter: 07/04/21 ?11:17 AM ?PATIENT NAME: [place]vonne [place] ?[place] [address]   ?[phone] (home)  ?DOB: 04/14/1951 ?MRN: [id number] ?PRIMARY CARE PROVIDER:    ?Steele Sizer, MD,  ?[address] ?[place] [address] ?[phone] ? ?REFERRING PROVIDER:   ?Steele Sizer, MD ?[address] ?[address] ?[place],  [place] [address] ?[phone] ? ?RESPONSIBLE PARTY:    ?Contact Information   ? ? Name Relation Home Work Mobile  ? BRIANNAH, LONA Spouse [phone]  [phone]  ? Tobie Poet Daughter [phone]    ? ?  ? ? ? ?I met face to face with patient and family in the home. Palliative Care was asked to follow this patient by consultation request of  Steele Sizer, MD to address advance care planning and complex medical decision making. This is a follow up visit. ? ?                                 ASSESSMENT AND PLAN / RECOMMENDATIONS:  ? ?Advance Care Planning/Goals of Care: Goals include to maximize quality of life and symptom management. Patient/health care surrogate gave his/her permission to discuss. ?Our advance care planning conversation included a discussion about:    ?The value and importance of advance care planning  ?Experiences with loved ones who have been seriously ill or have died  ?Exploration of personal, cultural or spiritual beliefs that might influence medical decisions  ?Exploration of goals of care in the event of a sudden injury or illness  ?CODE STATUS: Full Code ? ?Education provided on Palliative Medicine. Will continue to provide supportive care, symptom management. ? ? ?Symptom Management/Plan: ? ?Alzheimer's dementia-patient requires cueing, redirection reorientation as needed. Family states that patient has been stable since last palliative visit; behaviors are better. Continue Aricept and Namenda as  directed. Husband has started giving Seroquel nightly for agitation. He states this has been helpful she also has as needed Depakote. Encourage ambulation, exercise as able to tolerate. Ongoing education on dementia. ? ?Depression and anxiety-mood has been stable.  Family reports improvement, especially with  family visiting from out of town last week. Continue mirtazapine 15 mg nightly. ? ?Decreased appetite-patient's appetite has improved.  Encourage well-balanced diet, education provided on diabetes, carbohydrate and sodium intake. ? ?Skin lesions-patient with skin lesions to left dorsal surface of hand, above left eyebrow and left lower extremity. Area on had stemmed from a wasp sting last year per husband. Patient encouraged not to pick at areas. Husband has been applying benadryl cream PRN itching. Encourage not to apply near eye. Recommend dermatology follow up.  ? ?Abdominal pain, right side pain-patient with CT scan on 06/26/2021; no acute abdominal findings, pancolonic diverticulosis without findings of acute diverticulitis. Will continue to monitor for worsening symptoms. C/o right sided pain today; differs from abdominal pain she been previously complaining of. May use acetaminophen PRN pain, heat pack for up to 20 minutes PRN. Notify PCP should symptoms worsen.  ? ?Follow up Palliative Care Visit: Palliative care will continue to follow for complex medical decision making, advance care planning, and clarification of goals. Return in 8 weeks or prn. ? ? ?This visit was coded based on medical decision making (MDM). ? ?PPS: 60% ? ?HOSPICE ELIGIBILITY/DIAGNOSIS: TBD ? ?Chief Complaint: Palliative Medicine follow up visit.  ? ?HISTORY OF PRESENT ILLNESS:  Yvonne Curtis is a 71 y.o. year old female  with Alzheimer's dementia, depression with anxiety, type 2 diabetes, hypertension, CKD 3, protein calorie malnutrition, GERD.  ? ?Patient resides at home with husband.  Husband and daughter report patient  improving since initial palliative consult.  She did very well last week when her sister-in-law visited from out of town.  Denies pain except occasional right sided abdomen pain; reports as "a catch on my side." Improves when movement. Per records she had previously been complaining of periumbilical pain. CT scan performed on 06/26/21; no acute abdominal findings, pancolonic diverticulosis without findings of acute diverticulitis, leiomyomatous uterus, aortic atherosclerosis. She denies shortness of breath, constipation, nausea. She is sleeping well at night. Husband states behaviors have improved. He has started given Seroquel nightly; he also has Depakote on hand as needed as needed. Improvement in appetite. Her potassium was 3.2; she was told by doctor to eat bananas for a week per Family does express patient sitting and not going out of the house as much and encourage her to exercise. No needs expressed at this time per patient or family. A 10-point ROS is negative, except for the pertinent positives and negatives detailed per the HPI.  ? ?History obtained from review of EMR, discussion with primary team, and interview with family, facility staff/caregiver and/or Ms. Losano.  ?I reviewed available labs, medications, imaging, studies and related documents from the EMR.  Records reviewed and summarized above.  ? ? ?Physical Exam: ?Current and past weights: 189 this morning. ?Pulse 96, resp 20, b/p 152/78, sats 96% on room air.  ?pulse ?Constitutional: NAD ?General: frail appearing ?EYES: anicteric sclera, lids intact, no discharge  ?ENMT: intact hearing, oral mucous membranes moist, dentition intact ?CV: S1S2, RRR, trace LE edema ?Pulmonary: LCTA, no increased work of breathing, no cough, room air ?Abdomen: normo-active BS + 4 quadrants, soft and non tender, no ascites ?GU: deferred ?MSK: no sarcopenia, moves all extremities, ambulatory ?Skin: warm and dry, no rashes. Dry lesion to dorsal surface of left hand,  lesion to left leg and dry lesion over left eyebrow ?Neuro:  no generalized weakness, A & O x 3, forgetful ?Psych: non-anxious affect; pleasant  ?Hem/lymph/immuno: no widespread bruising ? ? ?Thank you for the opportunity to participate in the care of Ms. Vandekamp.  The palliative care team will continue to follow. Please call our office at [phone] if we can be of additional assistance.  ? ?Ezekiel Slocumb, NP  ? ?COVID-19 PATIENT SCREENING TOOL ?Asked and negative response unless otherwise noted:  ? ?Have you had symptoms of covid, tested positive or been in contact with someone with symptoms/positive test in the past 5-10 days? No ? ?

## 2021-07-08 ENCOUNTER — Other Ambulatory Visit: Payer: Self-pay

## 2021-07-08 ENCOUNTER — Encounter: Payer: Self-pay | Admitting: Nurse Practitioner

## 2021-07-08 ENCOUNTER — Ambulatory Visit (INDEPENDENT_AMBULATORY_CARE_PROVIDER_SITE_OTHER): Payer: HMO | Admitting: Nurse Practitioner

## 2021-07-08 VITALS — BP 132/78 | HR 98 | Temp 98.1°F | Resp 16 | Ht 60.0 in | Wt 189.9 lb

## 2021-07-08 DIAGNOSIS — S61402A Unspecified open wound of left hand, initial encounter: Secondary | ICD-10-CM | POA: Diagnosis not present

## 2021-07-08 DIAGNOSIS — M545 Low back pain, unspecified: Secondary | ICD-10-CM

## 2021-07-08 DIAGNOSIS — K5900 Constipation, unspecified: Secondary | ICD-10-CM

## 2021-07-08 MED ORDER — MUPIROCIN 2 % EX OINT: 1.0000 "application " | TOPICAL_OINTMENT | Freq: Two times a day (BID) | CUTANEOUS | 0 refills | Status: DC

## 2021-07-08 MED ORDER — TIZANIDINE HCL 4 MG PO TABS: 4.0000 mg | ORAL_TABLET | Freq: Three times a day (TID) | ORAL | 0 refills | Status: DC | PRN

## 2021-07-08 NOTE — Progress Notes (Signed)
? ?BP 132/78   Pulse 98   Temp 98.1 ?F (36.7 ?C) (Oral)   Resp 16   Ht 5' (1.524 m)   Wt 189 lb 14.4 oz (86.1 kg)   SpO2 98%   BMI 37.09 kg/m?   ? ?Subjective:  ? ? Patient ID: Yvonne Curtis, female    DOB: 05/07/1950, 71 y.o.   MRN: [id number] ? ?HPI: ?Yvonne Curtis is a 70 y.o. female, here with husband ? ?Chief Complaint  ?Patient presents with  ? Rash  ?  On back of left hand  and on face  ? Flank Pain  ?  Keep getting a catch on left side  ? ?Right lower back pain: Pain comes and goes for the last few weeks.  She denies any trauma.  She denies any urinary complaints, no fever. She says she it just feels really tight and stiff.  Pain is reproducible. She says she notices the pain when she moves around. Discussed it is most likely musculoskeletal pain. Discussed trying a muscle relaxer and taking tylenol for pain.  Heat therapy could also be helpful.  ? ?Constipation:  Last bowel movement was two days ago.  She did take some exlax earlier today.  Discussed increasing fiber and increase water intake. Discussed that she can also dry dulcolax and if no improvement can try miralax. Her and her husband verbalized understanding.  ? ?Open wound on left hand:  She has a wound on her left hand from itching a spot she had there. It is now open and is red. Husband says they have been putting on benadryl and neosporin ointment.  Will try mupirocin cream to help.  ? ?Relevant past medical, surgical, family and social history reviewed and updated as indicated. Interim medical history since our last visit reviewed. ?Allergies and medications reviewed and updated. ? ?Review of Systems ? ?Constitutional: Negative for fever or weight change.  ?Respiratory: Negative for cough and shortness of breath.   ?Cardiovascular: Negative for chest pain or palpitations.  ?Gastrointestinal: Negative for abdominal pain, constipation ?Musculoskeletal: Positive for gait problem (uses cane) or joint swelling. Positive for right  side back pain ?Skin: Negative for rash. Positive open sore on left hand ?Neurological: Negative for dizziness or headache.  ?No other specific complaints in a complete review of systems (except as listed in HPI above).  ? ?   ?Objective:  ?  ?BP 132/78   Pulse 98   Temp 98.1 ?F (36.7 ?C) (Oral)   Resp 16   Ht 5' (1.524 m)   Wt 189 lb 14.4 oz (86.1 kg)   SpO2 98%   BMI 37.09 kg/m?   ?Wt Readings from Last 3 Encounters:  ?07/08/21 189 lb 14.4 oz (86.1 kg)  ?06/13/21 186 lb 9.6 oz (84.6 kg)  ?04/18/21 190 lb (86.2 kg)  ?  ?Physical Exam ? ?Constitutional: Patient appears well-developed and well-nourished. Obese  No distress.  ?HEENT: head atraumatic, normocephalic, pupils equal and reactive to light, neck supple ?Cardiovascular: Normal rate, regular rhythm and normal heart sounds.  No murmur heard. No BLE edema. ?Pulmonary/Chest: Effort normal and breath sounds normal. No respiratory distress. ?Abdominal: Soft.  There is no tenderness. ?MSK: right lower back pain reproducible ?Skin: open sore on left hand, redness noted  ?Psychiatric: Patient has a normal mood and affect. behavior is normal. Judgment and thought content normal.  ? ?Results for orders placed or performed in visit on 06/13/21  ?Basic metabolic panel  ?Result Value Ref Range  ?  Glucose 124 (H) 70 - 99 mg/dL  ? BUN 8 8 - 27 mg/dL  ? Creatinine, Ser 1.33 (H) 0.57 - 1.00 mg/dL  ? eGFR 43 (L) >59 mL/min/1.73  ? BUN/Creatinine Ratio 6 (L) 12 - 28  ? Sodium 143 134 - 144 mmol/L  ? Potassium 3.1 (L) 3.5 - 5.2 mmol/L  ? Chloride 100 96 - 106 mmol/L  ? CO2 24 20 - 29 mmol/L  ? Calcium 10.1 8.7 - 10.3 mg/dL  ? ?   ?Assessment & Plan:  ? ?1. Acute right-sided low back pain without sciatica ? ?- tiZANidine (ZANAFLEX) 4 MG tablet; Take 1 tablet (4 mg total) by mouth every 8 (eight) hours as needed for muscle spasms (muscle tightness).  Dispense: 60 tablet; Refill: 0 ? ?2. Constipation, unspecified constipation type ?-increase fiber and water intake ?-can use  dulcolax or miralax ? ?3. Open wound of left hand without foreign body, unspecified wound type, initial encounter ? ?- mupirocin ointment (BACTROBAN) 2 %; Apply 1 application. topically 2 (two) times daily.  Dispense: 22 g; Refill: 0  ? ?Follow up plan: ?Return if symptoms worsen or fail to improve. ? ? ? ? ? ?

## 2021-07-10 DIAGNOSIS — D631 Anemia in chronic kidney disease: Secondary | ICD-10-CM | POA: Diagnosis not present

## 2021-07-10 DIAGNOSIS — E1122 Type 2 diabetes mellitus with diabetic chronic kidney disease: Secondary | ICD-10-CM | POA: Diagnosis not present

## 2021-07-10 DIAGNOSIS — R809 Proteinuria, unspecified: Secondary | ICD-10-CM | POA: Diagnosis not present

## 2021-07-10 DIAGNOSIS — E7800 Pure hypercholesterolemia, unspecified: Secondary | ICD-10-CM | POA: Diagnosis not present

## 2021-07-10 DIAGNOSIS — E1129 Type 2 diabetes mellitus with other diabetic kidney complication: Secondary | ICD-10-CM | POA: Diagnosis not present

## 2021-07-10 DIAGNOSIS — I10 Essential (primary) hypertension: Secondary | ICD-10-CM | POA: Diagnosis not present

## 2021-07-10 DIAGNOSIS — F03918 Unspecified dementia, unspecified severity, with other behavioral disturbance: Secondary | ICD-10-CM | POA: Diagnosis not present

## 2021-07-10 DIAGNOSIS — E785 Hyperlipidemia, unspecified: Secondary | ICD-10-CM | POA: Diagnosis not present

## 2021-07-10 DIAGNOSIS — N1832 Chronic kidney disease, stage 3b: Secondary | ICD-10-CM | POA: Diagnosis not present

## 2021-07-19 ENCOUNTER — Other Ambulatory Visit: Payer: Self-pay

## 2021-07-19 ENCOUNTER — Emergency Department: Payer: HMO

## 2021-07-19 ENCOUNTER — Other Ambulatory Visit: Payer: Self-pay | Admitting: Family Medicine

## 2021-07-19 ENCOUNTER — Telehealth: Payer: Self-pay

## 2021-07-19 ENCOUNTER — Emergency Department
Admission: EM | Admit: 2021-07-19 | Discharge: 2021-07-19 | Disposition: A | Discharge status: Home / Self Care | Payer: HMO | Attending: Emergency Medicine | Admitting: Emergency Medicine

## 2021-07-19 DIAGNOSIS — R7989 Other specified abnormal findings of blood chemistry: Secondary | ICD-10-CM | POA: Diagnosis not present

## 2021-07-19 DIAGNOSIS — N189 Chronic kidney disease, unspecified: Secondary | ICD-10-CM | POA: Insufficient documentation

## 2021-07-19 DIAGNOSIS — N281 Cyst of kidney, acquired: Secondary | ICD-10-CM | POA: Diagnosis not present

## 2021-07-19 DIAGNOSIS — E049 Nontoxic goiter, unspecified: Secondary | ICD-10-CM

## 2021-07-19 DIAGNOSIS — F0390 Unspecified dementia without behavioral disturbance: Secondary | ICD-10-CM | POA: Insufficient documentation

## 2021-07-19 DIAGNOSIS — E1122 Type 2 diabetes mellitus with diabetic chronic kidney disease: Secondary | ICD-10-CM | POA: Diagnosis not present

## 2021-07-19 DIAGNOSIS — R079 Chest pain, unspecified: Secondary | ICD-10-CM | POA: Diagnosis not present

## 2021-07-19 DIAGNOSIS — I10 Essential (primary) hypertension: Secondary | ICD-10-CM | POA: Diagnosis not present

## 2021-07-19 DIAGNOSIS — K5730 Diverticulosis of large intestine without perforation or abscess without bleeding: Secondary | ICD-10-CM | POA: Diagnosis not present

## 2021-07-19 DIAGNOSIS — R1084 Generalized abdominal pain: Secondary | ICD-10-CM | POA: Diagnosis not present

## 2021-07-19 DIAGNOSIS — R1013 Epigastric pain: Secondary | ICD-10-CM | POA: Diagnosis not present

## 2021-07-19 DIAGNOSIS — I129 Hypertensive chronic kidney disease with stage 1 through stage 4 chronic kidney disease, or unspecified chronic kidney disease: Secondary | ICD-10-CM | POA: Diagnosis not present

## 2021-07-19 DIAGNOSIS — R000 Tachycardia, unspecified: Secondary | ICD-10-CM | POA: Insufficient documentation

## 2021-07-19 LAB — CBC
HCT: 35.7 % — ABNORMAL LOW (ref 36.0–46.0)
Hemoglobin: 11.5 g/dL — ABNORMAL LOW (ref 12.0–15.0)
MCH: 27.4 pg (ref 26.0–34.0)
MCHC: 32.2 g/dL (ref 30.0–36.0)
MCV: 85 fL (ref 80.0–100.0)
Platelets: 444 10*3/uL — ABNORMAL HIGH (ref 150–400)
RBC: 4.2 MIL/uL (ref 3.87–5.11)
RDW: 15.4 % (ref 11.5–15.5)
WBC: 14 10*3/uL — ABNORMAL HIGH (ref 4.0–10.5)
nRBC: 0 % (ref 0.0–0.2)

## 2021-07-19 LAB — COMPREHENSIVE METABOLIC PANEL
ALT: 14 U/L (ref 0–44)
AST: 21 U/L (ref 15–41)
Albumin: 3.8 g/dL (ref 3.5–5.0)
Alkaline Phosphatase: 81 U/L (ref 38–126)
Anion gap: 11 (ref 5–15)
BUN: 13 mg/dL (ref 8–23)
CO2: 28 mmol/L (ref 22–32)
Calcium: 9.7 mg/dL (ref 8.9–10.3)
Chloride: 103 mmol/L (ref 98–111)
Creatinine, Ser: 1.38 mg/dL — ABNORMAL HIGH (ref 0.44–1.00)
GFR, Estimated: 41 mL/min — ABNORMAL LOW (ref >60)
Glucose, Bld: 154 mg/dL — ABNORMAL HIGH (ref 70–99)
Potassium: 2.7 mmol/L — CL (ref 3.5–5.1)
Sodium: 142 mmol/L (ref 135–145)
Total Bilirubin: 0.8 mg/dL (ref 0.3–1.2)
Total Protein: 7.9 g/dL (ref 6.5–8.1)

## 2021-07-19 LAB — D-DIMER, QUANTITATIVE: D-Dimer, Quant: 1.38 ug/mL-FEU — ABNORMAL HIGH (ref 0.00–0.50)

## 2021-07-19 LAB — TROPONIN I (HIGH SENSITIVITY)
Troponin I (High Sensitivity): 6 ng/L (ref <18)
Troponin I (High Sensitivity): 8 ng/L (ref <18)

## 2021-07-19 LAB — LIPASE, BLOOD: Lipase: 48 U/L (ref 11–51)

## 2021-07-19 MED ORDER — IOHEXOL 350 MG/ML SOLN
100.0000 mL | Freq: Once | INTRAVENOUS | Status: AC | PRN
  Administered 2021-07-19: 100 mL via INTRAVENOUS

## 2021-07-19 NOTE — ED Notes (Signed)
Relayed to patient she cannot have anything by mouth at this time ?

## 2021-07-19 NOTE — ED Provider Notes (Signed)
? ?[facility] ?Provider Note ? ? ? Event Date/Time  ? First MD Initiated Contact with Patient 07/19/21 0732   ?  (approximate) ? ? ?History  ? ?Abdominal pain ? ?History somewhat limited by reported dementia ?HPI ? ?Yvonne Curtis is a 71 y.o. female with a history of diabetes, hypertension, dementia, CKD who presents with reports of abdominal pain.  Husband is primarily providing history.  He reports he has been complaining of abdominal pain for several weeks.  Apparently had a CT scan on February 22, I reviewed the scan and it was read as no acute findings.  This morning she seemed to be complaining of pain in her epigastrium and left upper quadrant.  No vomiting or diarrhea. ?  ? ? ?Physical Exam  ? ?Triage Vital Signs: ?ED Triage Vitals  ?Enc Vitals Group  ?   BP 07/19/21 0738 (!) 162/84  ?   Pulse Rate 07/19/21 0738 (!) 116  ?   Resp 07/19/21 0738 (!) 22  ?   Temp 07/19/21 0738 98.1 ?F (36.7 ?C)  ?   Temp src --   ?   SpO2 07/19/21 0738 97 %  ?   Weight 07/19/21 0737 86.1 kg (189 lb 14.4 oz)  ?   Height 07/19/21 0737 1.524 m (5')  ?   Head Circumference --   ?   Peak Flow --   ?   Pain Score 07/19/21 0736 10  ?   Pain Loc --   ?   Pain Edu? --   ?   Excl. in [place]? --   ? ? ?Most recent vital signs: ?Vitals:  ? 07/19/21 1000 07/19/21 1030  ?BP: 128/71 133/70  ?Pulse: 84   ?Resp: 14 18  ?Temp:    ?SpO2: 95% 96%  ? ? ? ?General: Awake, no distress.  ?CV:  Good peripheral perfusion.  Tachycardia noted ?Resp:  Normal effort.  CTA B ?Abd:  No distention.  No tenderness palpation ?Other:  No lower extremity swelling or tenderness to palpation ? ? ?ED Results / Procedures / Treatments  ? ?Labs ?(all labs ordered are listed, but only abnormal results are displayed) ?Labs Reviewed  ?CBC - Abnormal; Notable for the following components:  ?    Result Value  ? WBC 14.0 (*)   ? Hemoglobin 11.5 (*)   ? HCT 35.7 (*)   ? Platelets 444 (*)   ? All other components within normal limits  ?D-DIMER,  QUANTITATIVE - Abnormal; Notable for the following components:  ? D-Dimer, Quant 1.38 (*)   ? All other components within normal limits  ?COMPREHENSIVE METABOLIC PANEL - Abnormal; Notable for the following components:  ? Potassium 2.7 (*)   ? Glucose, Bld 154 (*)   ? Creatinine, Ser 1.38 (*)   ? GFR, Estimated 41 (*)   ? All other components within normal limits  ?LIPASE, BLOOD  ?TROPONIN I (HIGH SENSITIVITY)  ?TROPONIN I (HIGH SENSITIVITY)  ? ? ? ?EKG ? ?ED ECG REPORT ?I, Lavonia Drafts, the attending physician, personally viewed and interpreted this ECG. ? ?Date: 07/19/2021 ? ?Rhythm: Sinus tachycardia ?QRS Axis: normal ?Intervals: normal ?ST/T Wave abnormalities: normal ?Narrative Interpretation: no evidence of acute ischemia ? ? ? ?RADIOLOGY ?Chest x-ray viewed interpreted by me, no acute abnormality ? ? ? ?PROCEDURES: ? ?Critical Care performed:  ? ?Procedures ? ? ?MEDICATIONS ORDERED IN ED: ?Medications  ?iohexol (OMNIPAQUE) 350 MG/ML injection 100 mL (100 mLs Intravenous Contrast Given 07/19/21 0922)  ? ? ? ?  IMPRESSION / MDM / ASSESSMENT AND PLAN / ED COURSE  ?I reviewed the triage vital signs and the nursing notes. ? ? ?Patient presents with epigastric/left upper quadrant pain as detailed above, seems to be ongoing for several weeks.  Had normal CT abdomen pelvis nearly 1 month ago.  She is mildly tachycardic upon arrival, left upper quadrant pain.  We will send labs including CBC CMP lipase, D-dimer ? ? ?D-dimer is elevated, patient has mild hypokalemia otherwise lab work is reassuring ? ?Sent for CT angiography of the chest and abdomen pelvis given elevated D-dimer and intermittent pain under the left breast ? ?Scan is overall reassuring, goiter noted discussed this with husband need for outpatient follow-up. ? ?Recommend follow-up with GI for continued evaluation ? ? ? ?  ? ? ?FINAL CLINICAL IMPRESSION(S) / ED DIAGNOSES  ? ?Final diagnoses:  ?Generalized abdominal pain  ? ? ? ?Rx / DC Orders  ? ?ED  Discharge Orders   ? ? None  ? ?  ? ? ? ?Note:  This document was prepared using Dragon voice recognition software and may include unintentional dictation errors. ?  ?Lavonia Drafts, MD ?07/19/21 1102 ? ?

## 2021-07-19 NOTE — Discharge Instructions (Addendum)
Please follow-up with your thyroid doctor regarding the goiter noted on your CT scan ?

## 2021-07-19 NOTE — ED Triage Notes (Addendum)
Patient to ER via [place] with husband. Reports waking up at 530 this morning with epigastric pain and abdominal pain that radiates into her left side. Describes the pain as "shooting into my left breast". Husband reports patient has been complaining of abdominal pain for a while.  ? ?Denies NVD/ no urinary symptoms.  ?

## 2021-07-19 NOTE — ED Notes (Signed)
Md Corky Downs made aware of result called by lab: potassium 2.7 ?

## 2021-07-19 NOTE — Telephone Encounter (Signed)
Copied from [place] [phone]. Topic: General - Other >> Jul 19, 2021  2:42 PM Wynetta Emery, [place] C wrote: Reason for CRM: pt's spouse called in to follow up, he says that pt just had a thyroid ultras sound completed on 3/1. Pt would like to know if/why do she need another one so soon?   Please advise.

## 2021-07-19 NOTE — ED Notes (Signed)
Pt in X ray

## 2021-07-24 DIAGNOSIS — E042 Nontoxic multinodular goiter: Secondary | ICD-10-CM | POA: Diagnosis not present

## 2021-07-24 DIAGNOSIS — I152 Hypertension secondary to endocrine disorders: Secondary | ICD-10-CM | POA: Diagnosis not present

## 2021-07-24 DIAGNOSIS — E0590 Thyrotoxicosis, unspecified without thyrotoxic crisis or storm: Secondary | ICD-10-CM | POA: Diagnosis not present

## 2021-07-24 DIAGNOSIS — E876 Hypokalemia: Secondary | ICD-10-CM | POA: Diagnosis not present

## 2021-07-25 DIAGNOSIS — E042 Nontoxic multinodular goiter: Secondary | ICD-10-CM | POA: Diagnosis not present

## 2021-07-26 DIAGNOSIS — E042 Nontoxic multinodular goiter: Secondary | ICD-10-CM | POA: Diagnosis not present

## 2021-08-07 ENCOUNTER — Telehealth: Payer: Self-pay

## 2021-08-07 NOTE — Telephone Encounter (Signed)
Patient husband came by the office and went over results and she verbalized understanding  ?

## 2021-08-07 NOTE — Telephone Encounter (Signed)
Tried to call patient but voicemail is not set up. Has tried multiply times sending a letter.  ?

## 2021-08-07 NOTE — Telephone Encounter (Signed)
-----   Message from Lucilla Lame, MD sent at 06/28/2021  6:15 PM EST ----- ?Please let the patient's family know that the CT scan did not show any cause for the weight loss and did show some hardening of the arteries and some diverticulosis without any diverticulitis. ?

## 2021-08-19 NOTE — Progress Notes (Signed)
Name: Yvonne Curtis   MRN: [id number]    DOB: Dec 30, 1950   Date:08/20/2021 ? ?     Progress Note ? ?Subjective ? ?Chief Complaint ? ?Follow Up ? ?HPI ? ?DMII with renal manifestation: she has been off Metformin and  A1C  has been stable last visit it was down to 5.5% She denies polyphagia, polyuria or polydipsia. Last urine micro negative but she has a history of microalbuminuria that resolved with ARB. She is now under the care of nephrologist. She also has obesity  ? ?MDD: she was on Citalopram and was also given zoloft in the past, however currently only on Remeron - dose adjusted from 7.5 mg to 15 mg by Dr. Melrose Nakayama recently, she has sundowning , advised to take seroquel around 2:30 pm, since symptoms of agitation starts around 3 pm to see if it will improve .  ? ?Morbid obesity/Malnutrition: BMI above 35 with co-morbidities such as DM, HTN , CKI.She had lost 37 lbs in the past year, but weight is up a few pounds since last visit.  ? ?CKI stage III: seen by Dr. Lanora Manis - nephrologist . She is off HCTZ due to hypercalcemia but has been compliant with Micardis and Norvasc  She denies chest pain or palpitation ? ?Alzheimer's dementia with mood changes: seen by Dr. Melrose Nakayama neurologist and had a visit at [facility] for second opinion 09/22. She is currently taking Namenda, Aricepct, Remeron and  seroquel.  She is now getting palliative care.  ? ?Recurrent abdominal pain: she went to [facility] again 07/2021 and normal CT - CTA , symptoms resolved again , symptoms are intermittent and of unknown etiology  ? ?Patient Active Problem List  ? Diagnosis Date Noted  ? Moderate protein-calorie malnutrition ([place]) 08/20/2021  ? Moderate episode of recurrent major depressive disorder ([place]) 08/20/2021  ? Cyst of kidney, acquired 07/04/2021  ? Anemia in chronic kidney disease 02/07/2021  ? Pure hypercholesterolemia, unspecified 02/07/2021  ? Hypercalcemia 10/11/2020  ? Chronic kidney disease, stage 3b ([place]) 09/04/2020  ? Dysphagia   ? History  of colonic polyps   ? Polyp of transverse colon   ? Abnormal sense of taste 12/19/2019  ? Loss of memory 12/19/2019  ? Mood changes 12/19/2019  ? Acanthosis nigricans 11/14/2014  ? Benign hypertension 11/14/2014  ? Dyslipidemia 11/14/2014  ? Gastro-esophageal reflux disease without esophagitis 11/14/2014  ? Extreme obesity 11/14/2014  ? Primary osteoarthritis of both knees 11/14/2014  ? Microalbuminuria 10/15/2012  ? Controlled type 2 diabetes mellitus with microalbuminuria ([place]) 06/05/2009  ? Body dermatophytosis 12/13/2008  ? ? ?Past Surgical History:  ?Procedure Laterality Date  ? CHOLECYSTECTOMY    ? COLONOSCOPY WITH PROPOFOL N/A 11/30/2014  ? Procedure: COLONOSCOPY WITH PROPOFOL;  Surgeon: Lucilla Lame, MD;  Location: [place];  Service: Endoscopy;  Laterality: N/A;  WITH BIOPSY-- ?TRANSVERSE COLON POLYP  X 2 ?[place] COLON POLYP  ? COLONOSCOPY WITH PROPOFOL N/A 01/17/2020  ? Procedure: COLONOSCOPY WITH PROPOFOL;  Surgeon: Lucilla Lame, MD;  Location: [facility] ENDOSCOPY;  Service: Endoscopy;  Laterality: N/A;  ? ESOPHAGOGASTRODUODENOSCOPY (EGD) WITH PROPOFOL N/A 08/23/2020  ? Procedure: ESOPHAGOGASTRODUODENOSCOPY (EGD) WITH PROPOFOL;  Surgeon: Lucilla Lame, MD;  Location: [facility] ENDOSCOPY;  Service: Endoscopy;  Laterality: N/A;  ? GALLBLADDER SURGERY    ? SEPTOPLASTY    ? tonsillectomy    ? TONSILLECTOMY    ? TUBAL LIGATION    ? ? ?Family History  ?Problem Relation Age of Onset  ? Colon cancer Mother   ? Lung cancer  Father   ? Hypertension Sister   ? Hypertension Sister   ? Breast cancer Neg Hx   ? ? ?Social History  ? ?Tobacco Use  ? Smoking status: Never  ? Smokeless tobacco: Never  ?Substance Use Topics  ? Alcohol use: No  ?  Alcohol/week: 0.0 standard drinks  ? ? ? ?Current Outpatient Medications:  ?  acetaminophen (TYLENOL) 500 MG tablet, Take by mouth., Disp: , Rfl:  ?  amLODipine (NORVASC) 10 MG tablet, Take 1 tablet (10 mg total) by mouth daily., Disp: 90 tablet, Rfl: 1 ?  blood glucose meter kit and  supplies, Dispense based on patient and insurance preference. Use once daily as directed. (FOR ICD-10 E10.9, E11.9). STrips 100 Lancets 100, Disp: 1 each, Rfl: 0 ?  cetirizine (ZYRTEC) 10 MG tablet, Take 1 tablet (10 mg total) by mouth daily as needed., Disp: 90 tablet, Rfl: 1 ?  Cholecalciferol (VITAMIN D3) 2000 UNITS capsule, , Disp: , Rfl:  ?  divalproex (DEPAKOTE) 125 MG DR tablet, Take 125 mg by mouth 3 (three) times daily., Disp: , Rfl:  ?  donepezil (ARICEPT) 10 MG tablet, Take by mouth., Disp: , Rfl:  ?  famotidine (PEPCID) 40 MG tablet, Take 1 tablet (40 mg total) by mouth at bedtime., Disp: 30 tablet, Rfl: 0 ?  Lancets (ONETOUCH DELICA PLUS [id number]) MISC, Use as directed to check blood sugar, Disp: , Rfl:  ?  ondansetron (ZOFRAN) 4 MG tablet, Take 1 tablet (4 mg total) by mouth every 8 (eight) hours as needed for nausea or vomiting., Disp: 20 tablet, Rfl: 0 ?  pantoprazole (PROTONIX) 40 MG tablet, Take 1 tablet (40 mg total) by mouth daily., Disp: 30 tablet, Rfl: 11 ?  QUEtiapine (SEROQUEL) 50 MG tablet, Take 50 mg by mouth at bedtime., Disp: , Rfl:  ?  sertraline (ZOLOFT) 25 MG tablet, Take 25 mg by mouth daily., Disp: , Rfl:  ?  tiZANidine (ZANAFLEX) 4 MG tablet, Take 1 tablet (4 mg total) by mouth every 8 (eight) hours as needed for muscle spasms (muscle tightness)., Disp: 60 tablet, Rfl: 0 ?  atorvastatin (LIPITOR) 40 MG tablet, Take 1 tablet (40 mg total) by mouth daily., Disp: 90 tablet, Rfl: 1 ?  memantine (NAMENDA) 10 MG tablet, Take 10 mg by mouth 2 (two) times daily. (Patient not taking: Reported on 08/20/2021), Disp: , Rfl:  ?  montelukast (SINGULAIR) 10 MG tablet, Take 1 tablet (10 mg total) by mouth at bedtime., Disp: 90 tablet, Rfl: 1 ?  ONETOUCH ULTRA test strip, 1 each by Other route daily. Use as directed to check blood sugar, Disp: 100 each, Rfl: 2 ?  telmisartan (MICARDIS) 80 MG tablet, Take 1 tablet (80 mg total) by mouth daily., Disp: 90 tablet, Rfl: 1 ? ?Allergies  ?Allergen  Reactions  ? Oxycodone Other (See Comments)  ? Oxycodone Hcl   ?  insomnia, agitation  ? ? ?I personally reviewed active problem list, medication list, allergies, family history, social history, health maintenance with the patient/caregiver today. ? ? ?ROS ? ?Constitutional: Negative for fever or weight change.  ?Respiratory: Negative for cough and shortness of breath.   ?Cardiovascular: Negative for chest pain or palpitations.  ?Gastrointestinal: Negative for abdominal pain, no bowel changes.  ?Musculoskeletal: Negative for gait problem or joint swelling.  ?Skin: Negative for rash.  ?Neurological: Negative for dizziness or headache.  ?No other specific complaints in a complete review of systems (except as listed in HPI above).  ? ?Objective ? ?Vitals:  ?  08/20/21 1114  ?BP: 134/82  ?Pulse: 92  ?Resp: 16  ?SpO2: 98%  ?Weight: 194 lb (88 kg)  ?Height: $RemoveB'5\' 1"'FuEHLytW$  (1.549 m)  ? ? ?Body mass index is 36.66 kg/m?. ? ?Physical Exam ? ?Constitutional: Patient appears well-developed Obese  No distress.  ?HEENT: head atraumatic, normocephalic, pupils equal and reactive to light, neck supple ?Cardiovascular: Normal rate, regular rhythm and normal heart sounds.  No murmur heard. No BLE edema. ?Pulmonary/Chest: Effort normal and breath sounds normal. No respiratory distress. ?Abdominal: Soft.  There is no tenderness. ?Psychiatric: Patient has a flat affect  cooperative  ? ?Recent Results (from the past 2160 hour(s))  ?Basic metabolic panel     Status: Abnormal  ? Collection Time: 06/13/21  2:42 PM  ?Result Value Ref Range  ? Glucose 124 (H) 70 - 99 mg/dL  ? BUN 8 8 - 27 mg/dL  ? Creatinine, Ser 1.33 (H) 0.57 - 1.00 mg/dL  ? eGFR 43 (L) >59 mL/min/1.73  ? BUN/Creatinine Ratio 6 (L) 12 - 28  ? Sodium 143 134 - 144 mmol/L  ? Potassium 3.1 (L) 3.5 - 5.2 mmol/L  ? Chloride 100 96 - 106 mmol/L  ? CO2 24 20 - 29 mmol/L  ? Calcium 10.1 8.7 - 10.3 mg/dL  ?CBC     Status: Abnormal  ? Collection Time: 07/19/21  7:56 AM  ?Result Value Ref Range   ? WBC 14.0 (H) 4.0 - 10.5 K/uL  ? RBC 4.20 3.87 - 5.11 MIL/uL  ? Hemoglobin 11.5 (L) 12.0 - 15.0 g/dL  ? HCT 35.7 (L) 36.0 - 46.0 %  ? MCV 85.0 80.0 - 100.0 fL  ? MCH 27.4 26.0 - 34.0 pg  ? MCHC 32.2 30.0 -

## 2021-08-20 ENCOUNTER — Encounter: Payer: Self-pay | Admitting: Family Medicine

## 2021-08-20 ENCOUNTER — Ambulatory Visit (INDEPENDENT_AMBULATORY_CARE_PROVIDER_SITE_OTHER): Payer: HMO | Admitting: Family Medicine

## 2021-08-20 VITALS — BP 134/82 | HR 92 | Resp 16 | Ht 61.0 in | Wt 194.0 lb

## 2021-08-20 DIAGNOSIS — E1122 Type 2 diabetes mellitus with diabetic chronic kidney disease: Secondary | ICD-10-CM | POA: Diagnosis not present

## 2021-08-20 DIAGNOSIS — N1832 Chronic kidney disease, stage 3b: Secondary | ICD-10-CM | POA: Diagnosis not present

## 2021-08-20 DIAGNOSIS — J302 Other seasonal allergic rhinitis: Secondary | ICD-10-CM

## 2021-08-20 DIAGNOSIS — N183 Chronic kidney disease, stage 3 unspecified: Secondary | ICD-10-CM | POA: Diagnosis not present

## 2021-08-20 DIAGNOSIS — E785 Hyperlipidemia, unspecified: Secondary | ICD-10-CM

## 2021-08-20 DIAGNOSIS — E876 Hypokalemia: Secondary | ICD-10-CM

## 2021-08-20 DIAGNOSIS — F05 Delirium due to known physiological condition: Secondary | ICD-10-CM

## 2021-08-20 DIAGNOSIS — R058 Other specified cough: Secondary | ICD-10-CM | POA: Diagnosis not present

## 2021-08-20 DIAGNOSIS — F02B3 Dementia in other diseases classified elsewhere, moderate, with mood disturbance: Secondary | ICD-10-CM

## 2021-08-20 DIAGNOSIS — E559 Vitamin D deficiency, unspecified: Secondary | ICD-10-CM | POA: Diagnosis not present

## 2021-08-20 DIAGNOSIS — G308 Other Alzheimer's disease: Secondary | ICD-10-CM | POA: Diagnosis not present

## 2021-08-20 DIAGNOSIS — F331 Major depressive disorder, recurrent, moderate: Secondary | ICD-10-CM | POA: Diagnosis not present

## 2021-08-20 DIAGNOSIS — E440 Moderate protein-calorie malnutrition: Secondary | ICD-10-CM | POA: Insufficient documentation

## 2021-08-20 DIAGNOSIS — E441 Mild protein-calorie malnutrition: Secondary | ICD-10-CM | POA: Diagnosis not present

## 2021-08-20 MED ORDER — ATORVASTATIN CALCIUM 40 MG PO TABS: 40.0000 mg | ORAL_TABLET | Freq: Every day | ORAL | 1 refills | Status: DC

## 2021-08-20 MED ORDER — ONETOUCH ULTRA VI STRP: 1.0000 | ORAL_STRIP | Freq: Every day | 2 refills | Status: DC

## 2021-08-20 MED ORDER — TELMISARTAN 80 MG PO TABS: 80.0000 mg | ORAL_TABLET | Freq: Every day | ORAL | 1 refills | Status: DC

## 2021-08-20 MED ORDER — MONTELUKAST SODIUM 10 MG PO TABS: 10.0000 mg | ORAL_TABLET | Freq: Every day | ORAL | 1 refills | Status: DC

## 2021-08-21 LAB — BASIC METABOLIC PANEL WITH GFR
BUN/Creatinine Ratio: 6 (calc) (ref 6–22)
BUN: 9 mg/dL (ref 7–25)
CO2: 24 mmol/L (ref 20–32)
Calcium: 10.3 mg/dL (ref 8.6–10.4)
Chloride: 106 mmol/L (ref 98–110)
Creat: 1.57 mg/dL — ABNORMAL HIGH (ref 0.60–1.00)
Glucose, Bld: 128 mg/dL — ABNORMAL HIGH (ref 65–99)
Potassium: 4 mmol/L (ref 3.5–5.3)
Sodium: 141 mmol/L (ref 135–146)
eGFR: 35 mL/min/{1.73_m2} — ABNORMAL LOW (ref >=60)

## 2021-08-21 LAB — HEMOGLOBIN A1C
Hgb A1c MFr Bld: 5.8 % of total Hgb — ABNORMAL HIGH (ref <5.7)
Mean Plasma Glucose: 120 mg/dL
eAG (mmol/L): 6.6 mmol/L

## 2021-08-21 LAB — MAGNESIUM: Magnesium: 2 mg/dL (ref 1.5–2.5)

## 2021-08-29 DIAGNOSIS — R413 Other amnesia: Secondary | ICD-10-CM | POA: Diagnosis not present

## 2021-08-29 DIAGNOSIS — R432 Parageusia: Secondary | ICD-10-CM | POA: Diagnosis not present

## 2021-08-29 DIAGNOSIS — R4586 Emotional lability: Secondary | ICD-10-CM | POA: Diagnosis not present

## 2021-09-03 ENCOUNTER — Other Ambulatory Visit: Payer: HMO | Admitting: Student

## 2021-09-03 DIAGNOSIS — F0283 Dementia in other diseases classified elsewhere, unspecified severity, with mood disturbance: Secondary | ICD-10-CM | POA: Diagnosis not present

## 2021-09-03 DIAGNOSIS — Z515 Encounter for palliative care: Secondary | ICD-10-CM | POA: Diagnosis not present

## 2021-09-03 DIAGNOSIS — R638 Other symptoms and signs concerning food and fluid intake: Secondary | ICD-10-CM | POA: Diagnosis not present

## 2021-09-03 DIAGNOSIS — F339 Major depressive disorder, recurrent, unspecified: Secondary | ICD-10-CM | POA: Diagnosis not present

## 2021-09-03 DIAGNOSIS — G309 Alzheimer's disease, unspecified: Secondary | ICD-10-CM | POA: Diagnosis not present

## 2021-09-03 NOTE — Progress Notes (Signed)
? ? ?Manufacturing engineer ?Community Palliative Care Consult Note ?Telephone: [phone]  ?Fax: [phone]  ? ? ?Date of encounter: 09/03/21 ?9:20 AM ?PATIENT NAME: Yvonne Curtis ?[place] [address]   ?[phone] (home)  ?DOB: June 04, 1950 ?MRN: [id number] ?PRIMARY CARE PROVIDER:    ?Steele Sizer, MD,  ?[address] ?[place] [address] ?[phone] ? ?REFERRING PROVIDER:   ?Steele Sizer, MD ?[address] ?[address] ?[place],  [place] [address] ?[phone] ? ?RESPONSIBLE PARTY:    ?Contact Information   ? ? Name Relation Home Work Mobile  ? Yvonne Curtis Spouse [phone]  [phone]  ? Yvonne Curtis Daughter [phone]    ? ?  ? ? ? ?I met face to face with patient and family in the home. Palliative Care was asked to follow this patient by consultation request of  Steele Sizer, MD to address advance care planning and complex medical decision making. This is a follow up visit. ? ?                                 ASSESSMENT AND PLAN / RECOMMENDATIONS:  ? ?Advance Care Planning/Goals of Care: Goals include to maximize quality of life and symptom management. Patient/health care surrogate gave his/her permission to discuss. ?CODE STATUS: Full Code ? ?Palliative medicine will continue to provide ongoing support, symptom management as needed.  ? ?Symptom Management/Plan: ? ?Alzheimer's dementia-patient requires cueing, redirection reorientation as needed. Patient continues to sun down. Husband currently giving Depakote BID, encourage him to administer TID as she worsens in the afternoon. She is also to have her Seroquel increased to 75 mg QHS. Continue daily routine, encourage activities that she enjoys. Monitor for falls/safety.  ? ?Depression- mood has been stable.  Continue mirtazapine 15 mg nightly. Appetite has also improved.  ? ?Appetite-patient's appetite has improved, she will also occasionally drink nutritional supplements.  ? ? ?Follow up  Palliative Care Visit: Palliative care will continue to follow for complex medical decision making, advance care planning, and clarification of goals. Return in 8 weeks or prn. ? ?This visit was coded based on medical decision making (MDM). ? ?PPS: 60% ? ?HOSPICE ELIGIBILITY/DIAGNOSIS: TBD ? ?Chief Complaint: Palliative Medicine follow up visit.  ? ?HISTORY OF PRESENT ILLNESS:  Yvonne Curtis is a 71 y.o. year old female  with Alzheimer's dementia, depression with anxiety, type 2 diabetes, hypertension, CKD 3, protein calorie malnutrition, GERD.  ?   ?Patient resides at home with her husband. Patient continues to sun down. She was seen by PCP and neurologist since last palliative visit. She has tried [facility]; she went for a short period but did not want to return. She does visit her sister frequently, which she enjoys. She is napping intermittently during the day. No falls or injury reported. Appetite has improved. She is sleeping well at night. She denies abdominal pain. She does answer direct questions, although she dozes off during visit. Family contributes to HPI and ROS.  ? ? ?History obtained from review of EMR, discussion with primary team, and interview with family, facility staff/caregiver and/or Yvonne Curtis.  ?I reviewed available labs, medications, imaging, studies and related documents from the EMR.  Records reviewed and summarized above.  ? ?Physical Exam: ?Pulse 96, resp 16, b/p 164/88, sats 97% on room air ?Constitutional: NAD ?General: frail appearing ?EYES: anicteric sclera, lids intact, no discharge  ?ENMT: intact hearing, oral mucous membranes moist, dentition intact ?  CV: S1S2, RRR, trace pedal edema ?Pulmonary: LCTA, no increased work of breathing, no cough, room air ?Abdomen: normo-active BS + 4 quadrants, soft and non tender, no ascites ?GU: deferred ?MSK: moves all extremities, ambulatory ?Skin: warm and dry, no rashes or wounds on visible skin ?Neuro: + generalized  weakness, A & O to x 2, forgetful ?Psych: non-anxious affect, pleasant ?Hem/lymph/immuno: no widespread bruising ? ? ?Thank you for the opportunity to participate in the care of Yvonne Curtis.  The palliative care team will continue to follow. Please call our office at [phone] if we can be of additional assistance.  ? ?Ezekiel Slocumb, NP  ? ?COVID-19 PATIENT SCREENING TOOL ?Asked and negative response unless otherwise noted:  ? ?Have you had symptoms of covid, tested positive or been in contact with someone with symptoms/positive test in the past 5-10 days? No  ? ?

## 2021-09-13 ENCOUNTER — Other Ambulatory Visit: Payer: Self-pay | Admitting: Family Medicine

## 2021-09-13 DIAGNOSIS — J302 Other seasonal allergic rhinitis: Secondary | ICD-10-CM

## 2021-09-13 DIAGNOSIS — I10 Essential (primary) hypertension: Secondary | ICD-10-CM

## 2021-09-15 ENCOUNTER — Other Ambulatory Visit: Payer: Self-pay | Admitting: Gastroenterology

## 2021-09-22 DIAGNOSIS — F03B3 Unspecified dementia, moderate, with mood disturbance: Secondary | ICD-10-CM | POA: Diagnosis not present

## 2021-09-22 DIAGNOSIS — E1122 Type 2 diabetes mellitus with diabetic chronic kidney disease: Secondary | ICD-10-CM | POA: Diagnosis not present

## 2021-09-22 DIAGNOSIS — F331 Major depressive disorder, recurrent, moderate: Secondary | ICD-10-CM | POA: Diagnosis not present

## 2021-09-22 DIAGNOSIS — E1136 Type 2 diabetes mellitus with diabetic cataract: Secondary | ICD-10-CM | POA: Diagnosis not present

## 2021-09-22 DIAGNOSIS — E1165 Type 2 diabetes mellitus with hyperglycemia: Secondary | ICD-10-CM | POA: Diagnosis not present

## 2021-09-22 DIAGNOSIS — E1169 Type 2 diabetes mellitus with other specified complication: Secondary | ICD-10-CM | POA: Diagnosis not present

## 2021-09-22 DIAGNOSIS — F02B3 Dementia in other diseases classified elsewhere, moderate, with mood disturbance: Secondary | ICD-10-CM | POA: Diagnosis not present

## 2021-09-22 DIAGNOSIS — E039 Hypothyroidism, unspecified: Secondary | ICD-10-CM | POA: Diagnosis not present

## 2021-09-22 DIAGNOSIS — E0590 Thyrotoxicosis, unspecified without thyrotoxic crisis or storm: Secondary | ICD-10-CM | POA: Diagnosis not present

## 2021-09-22 DIAGNOSIS — N1832 Chronic kidney disease, stage 3b: Secondary | ICD-10-CM | POA: Diagnosis not present

## 2021-09-22 DIAGNOSIS — E212 Other hyperparathyroidism: Secondary | ICD-10-CM | POA: Diagnosis not present

## 2021-10-16 DIAGNOSIS — I10 Essential (primary) hypertension: Secondary | ICD-10-CM | POA: Diagnosis not present

## 2021-10-16 DIAGNOSIS — F03911 Unspecified dementia, unspecified severity, with agitation: Secondary | ICD-10-CM | POA: Diagnosis not present

## 2021-10-16 DIAGNOSIS — E7800 Pure hypercholesterolemia, unspecified: Secondary | ICD-10-CM | POA: Diagnosis not present

## 2021-10-16 DIAGNOSIS — R809 Proteinuria, unspecified: Secondary | ICD-10-CM | POA: Diagnosis not present

## 2021-10-16 DIAGNOSIS — N1832 Chronic kidney disease, stage 3b: Secondary | ICD-10-CM | POA: Diagnosis not present

## 2021-10-16 DIAGNOSIS — D631 Anemia in chronic kidney disease: Secondary | ICD-10-CM | POA: Diagnosis not present

## 2021-10-16 DIAGNOSIS — N281 Cyst of kidney, acquired: Secondary | ICD-10-CM | POA: Diagnosis not present

## 2021-10-16 DIAGNOSIS — E1122 Type 2 diabetes mellitus with diabetic chronic kidney disease: Secondary | ICD-10-CM | POA: Diagnosis not present

## 2021-10-16 DIAGNOSIS — E785 Hyperlipidemia, unspecified: Secondary | ICD-10-CM | POA: Diagnosis not present

## 2021-10-21 DIAGNOSIS — I10 Essential (primary) hypertension: Secondary | ICD-10-CM | POA: Diagnosis not present

## 2021-10-21 DIAGNOSIS — N1832 Chronic kidney disease, stage 3b: Secondary | ICD-10-CM | POA: Diagnosis not present

## 2021-10-21 DIAGNOSIS — R809 Proteinuria, unspecified: Secondary | ICD-10-CM | POA: Diagnosis not present

## 2021-10-21 DIAGNOSIS — E7800 Pure hypercholesterolemia, unspecified: Secondary | ICD-10-CM | POA: Diagnosis not present

## 2021-10-21 DIAGNOSIS — F03911 Unspecified dementia, unspecified severity, with agitation: Secondary | ICD-10-CM | POA: Diagnosis not present

## 2021-10-21 DIAGNOSIS — D631 Anemia in chronic kidney disease: Secondary | ICD-10-CM | POA: Diagnosis not present

## 2021-10-21 DIAGNOSIS — E1122 Type 2 diabetes mellitus with diabetic chronic kidney disease: Secondary | ICD-10-CM | POA: Diagnosis not present

## 2021-10-21 DIAGNOSIS — N281 Cyst of kidney, acquired: Secondary | ICD-10-CM | POA: Diagnosis not present

## 2021-10-21 DIAGNOSIS — E785 Hyperlipidemia, unspecified: Secondary | ICD-10-CM | POA: Diagnosis not present

## 2021-10-23 ENCOUNTER — Telehealth: Payer: Self-pay | Admitting: Family Medicine

## 2021-10-23 NOTE — Telephone Encounter (Signed)
Pts husband would like a call back regarding his wife.

## 2021-10-23 NOTE — Progress Notes (Signed)
Name: Yvonne Curtis   MRN: [id number]    DOB: [birth year]   Date:10/24/2021       Progress Note  Subjective  Chief Complaint  Feet Swelling  HPI  Feet edema: husband states he noticed her feet being swollen a few weeks ago, patient has not been complaining of pain or SOB. No orthopnea. He has tried elevating her feet but she does not like to stay still. There is no redness or increase in warmth, symmetrical edema  Patient Active Problem List   Diagnosis Date Noted   Moderate protein-calorie malnutrition ([place]) 08/20/2021   Moderate episode of recurrent major depressive disorder ([place]) 08/20/2021   Cyst of kidney, acquired 07/04/2021   Anemia in chronic kidney disease 02/07/2021   Pure hypercholesterolemia, unspecified 02/07/2021   Hypercalcemia 10/11/2020   Chronic kidney disease, stage 3b ([place]) 09/04/2020   Dysphagia    History of colonic polyps    Polyp of transverse colon    Abnormal sense of taste 12/19/2019   Loss of memory 12/19/2019   Mood changes 12/19/2019   Acanthosis nigricans 11/14/2014   Benign hypertension 11/14/2014   Dyslipidemia 11/14/2014   Gastro-esophageal reflux disease without esophagitis 11/14/2014   Extreme obesity 11/14/2014   Primary osteoarthritis of both knees 11/14/2014   Microalbuminuria 10/15/2012   Controlled type 2 diabetes mellitus with microalbuminuria ([place]) 06/05/2009   Body dermatophytosis 12/13/2008    Past Surgical History:  Procedure Laterality Date   CHOLECYSTECTOMY     COLONOSCOPY WITH PROPOFOL N/A 11/30/2014   Procedure: COLONOSCOPY WITH PROPOFOL;  Surgeon: Lucilla Lame, MD;  Location: [place];  Service: Endoscopy;  Laterality: N/A;  WITH BIOPSY-- TRANSVERSE COLON POLYP  X 2 [place] COLON POLYP   COLONOSCOPY WITH PROPOFOL N/A 01/17/2020   Procedure: COLONOSCOPY WITH PROPOFOL;  Surgeon: Lucilla Lame, MD;  Location: [facility] ENDOSCOPY;  Service: Endoscopy;  Laterality: N/A;   ESOPHAGOGASTRODUODENOSCOPY (EGD) WITH PROPOFOL  N/A 08/23/2020   Procedure: ESOPHAGOGASTRODUODENOSCOPY (EGD) WITH PROPOFOL;  Surgeon: Lucilla Lame, MD;  Location: [facility] ENDOSCOPY;  Service: Endoscopy;  Laterality: N/A;   GALLBLADDER SURGERY     SEPTOPLASTY     tonsillectomy     TONSILLECTOMY     TUBAL LIGATION      Family History  Problem Relation Age of Onset   Colon cancer Mother    Lung cancer Father    Hypertension Sister    Hypertension Sister    Breast cancer Neg Hx     Social History   Tobacco Use   Smoking status: Never   Smokeless tobacco: Never  Substance Use Topics   Alcohol use: No    Alcohol/week: 0.0 standard drinks of alcohol     Current Outpatient Medications:    acetaminophen (TYLENOL) 500 MG tablet, Take by mouth., Disp: , Rfl:    amLODipine (NORVASC) 10 MG tablet, Take 1 tablet by mouth once daily, Disp: 90 tablet, Rfl: 0   atorvastatin (LIPITOR) 40 MG tablet, Take 1 tablet (40 mg total) by mouth daily., Disp: 90 tablet, Rfl: 1   blood glucose meter kit and supplies, Dispense based on patient and insurance preference. Use once daily as directed. (FOR ICD-10 E10.9, E11.9). STrips 100 Lancets 100, Disp: 1 each, Rfl: 0   cetirizine (ZYRTEC) 10 MG tablet, TAKE 1 TABLET BY MOUTH ONCE DAILY AS NEEDED, Disp: 90 tablet, Rfl: 0   Cholecalciferol (VITAMIN D3) 2000 UNITS capsule, , Disp: , Rfl:    divalproex (DEPAKOTE) 125 MG DR tablet, Take 125 mg by mouth 3 (three)  times daily., Disp: , Rfl:    donepezil (ARICEPT) 10 MG tablet, Take by mouth., Disp: , Rfl:    Lancets (ONETOUCH DELICA PLUS [id number]) MISC, Use as directed to check blood sugar, Disp: , Rfl:    montelukast (SINGULAIR) 10 MG tablet, Take 1 tablet (10 mg total) by mouth at bedtime., Disp: 90 tablet, Rfl: 1   ONETOUCH ULTRA test strip, 1 each by Other route daily. Use as directed to check blood sugar, Disp: 100 each, Rfl: 2   pantoprazole (PROTONIX) 40 MG tablet, Take 1 tablet by mouth once daily, Disp: 90 tablet, Rfl: 1   potassium chloride (KLOR-CON)  20 MEQ packet, Take by mouth once., Disp: , Rfl:    QUEtiapine (SEROQUEL) 50 MG tablet, Take 50 mg by mouth at bedtime., Disp: , Rfl:    telmisartan (MICARDIS) 80 MG tablet, Take 1 tablet (80 mg total) by mouth daily., Disp: 90 tablet, Rfl: 1   tiZANidine (ZANAFLEX) 4 MG tablet, Take 1 tablet (4 mg total) by mouth every 8 (eight) hours as needed for muscle spasms (muscle tightness)., Disp: 60 tablet, Rfl: 0  Allergies  Allergen Reactions   Oxycodone Other (See Comments)   Oxycodone Hcl     insomnia, agitation    I personally reviewed active problem list, medication list, allergies, family history, social history, health maintenance with the patient/caregiver today.   ROS  Ten systems reviewed and is negative except as mentioned in HPI  She gets anxious before office visits, husband states she gets nervous as soon as they part and heart rate is higher during office visits  Objective  Vitals:   10/24/21 1053  BP: 140/68  Pulse: (!) 102  Resp: 16  SpO2: 99%  Weight: 207 lb (93.9 kg)  Height: 5' (1.524 m)    Body mass index is 40.43 kg/m.  Physical Exam  Constitutional: Patient appears well-developed and well-nourished. Obese  No distress.  HEENT: head atraumatic, normocephalic, pupils equal and reactive to light, neck supple Cardiovascular: Normal rate, regular rhythm and normal heart sounds.  No murmur heard. Trace  BLE edema, foot swelling is non pitting, ankle has a line due to tight socks around ankles . Pulmonary/Chest: Effort normal and breath sounds normal. No respiratory distress. Abdominal: Soft.  There is no tenderness. Psychiatric: Cooperative   Recent Results (from the past 2160 hour(s))  HgB A1c     Status: Abnormal   Collection Time: 08/20/21 11:56 AM  Result Value Ref Range   Hgb A1c MFr Bld 5.8 (H) <5.7 % of total Hgb    Comment: For someone without known diabetes, a hemoglobin  A1c value between 5.7% and 6.4% is consistent with prediabetes and should  be confirmed with a  follow-up test. . For someone with known diabetes, a value <7% indicates that their diabetes is well controlled. A1c targets should be individualized based on duration of diabetes, age, comorbid conditions, and other considerations. . This assay result is consistent with an increased risk of diabetes. . Currently, no consensus exists regarding use of hemoglobin A1c for diagnosis of diabetes for children. .    Mean Plasma Glucose 120 mg/dL   eAG (mmol/L) 6.6 mmol/L  BASIC METABOLIC PANEL WITH GFR     Status: Abnormal   Collection Time: 08/20/21 11:56 AM  Result Value Ref Range   Glucose, Bld 128 (H) 65 - 99 mg/dL    Comment: .            Fasting reference interval . For someone  without known diabetes, a glucose value >125 mg/dL indicates that they may have diabetes and this should be confirmed with a follow-up test. .    BUN 9 7 - 25 mg/dL   Creat 1.57 (H) 0.60 - 1.00 mg/dL   eGFR 35 (L) > OR = 60 mL/min/1.55m    Comment: The eGFR is based on the CKD-EPI 2021 equation. To calculate  the new eGFR from a previous Creatinine or Cystatin C result, go to https://www.kidney.org/professionals/ kdoqi/gfr%5Fcalculator    BUN/Creatinine Ratio 6 6 - 22 (calc)   Sodium 141 135 - 146 mmol/L   Potassium 4.0 3.5 - 5.3 mmol/L   Chloride 106 98 - 110 mmol/L   CO2 24 20 - 32 mmol/L   Calcium 10.3 8.6 - 10.4 mg/dL  Magnesium     Status: None   Collection Time: 08/20/21 11:56 AM  Result Value Ref Range   Magnesium 2.0 1.5 - 2.5 mg/dL    PHQ2/9:    10/24/2021   10:53 AM 08/20/2021   11:22 AM 07/08/2021    2:58 PM 04/18/2021    8:56 AM 04/18/2021    8:33 AM  Depression screen PHQ 2/9  Decreased Interest 0 _0 Down, Depressed, Hopeless 0 2 0 1 1  PHQ - 2 Score 0 _1 Altered sleeping  2 0 0 0  Tired, decreased energy  0 1 0 0  Change in appetite  0 0 3 3  Feeling bad or failure about yourself   0 0 0 0  Trouble concentrating  0 _2 Moving  slowly or fidgety/restless  0 1 0 0  Suicidal thoughts  0 0 0 0  PHQ-9 Score  _3 Difficult doing work/chores    Not difficult at all Not difficult at all    phq 9 is negative   Fall Risk:    10/24/2021   10:59 AM 08/20/2021   11:13 AM 07/08/2021    2:58 PM 04/18/2021    8:56 AM 04/18/2021    8:35 AM  Fall Risk   Falls in the past year? 0 _4 Number falls in past yr: 0 _5 Injury with Fall? 0 0 0 1 1  Risk for fall due to : Impaired balance/gait Impaired balance/gait History of fall(s) Impaired balance/gait;History of fall(s) History of fall(s);Impaired balance/gait  Follow up Falls prevention discussed Falls prevention discussed Falls evaluation completed Falls prevention discussed;Falls evaluation completed Falls prevention discussed      Functional Status Survey: Is the patient deaf or have difficulty hearing?: No Does the patient have difficulty seeing, even when wearing glasses/contacts?: No Does the patient have difficulty concentrating, remembering, or making decisions?: No Does the patient have difficulty walking or climbing stairs?: Yes Does the patient have difficulty dressing or bathing?: No Does the patient have difficulty doing errands alone such as visiting a doctor's office or shopping?: Yes    Assessment & Plan  1. Bilateral lower extremity edema   Unremarkable exam, reassurance given Discussed compression stocking hoses or at least diabetic socks that are not tight on ankles Answered questions

## 2021-10-24 ENCOUNTER — Ambulatory Visit (INDEPENDENT_AMBULATORY_CARE_PROVIDER_SITE_OTHER): Payer: HMO | Admitting: Family Medicine

## 2021-10-24 ENCOUNTER — Encounter: Payer: Self-pay | Admitting: Family Medicine

## 2021-10-24 VITALS — BP 140/68 | HR 102 | Resp 16 | Ht 60.0 in | Wt 207.0 lb

## 2021-10-24 DIAGNOSIS — R600 Localized edema: Secondary | ICD-10-CM | POA: Diagnosis not present

## 2021-10-29 DIAGNOSIS — E1122 Type 2 diabetes mellitus with diabetic chronic kidney disease: Secondary | ICD-10-CM | POA: Diagnosis not present

## 2021-10-29 DIAGNOSIS — G308 Other Alzheimer's disease: Secondary | ICD-10-CM | POA: Diagnosis not present

## 2021-10-29 DIAGNOSIS — R413 Other amnesia: Secondary | ICD-10-CM | POA: Diagnosis not present

## 2021-10-29 DIAGNOSIS — R432 Parageusia: Secondary | ICD-10-CM | POA: Diagnosis not present

## 2021-10-29 DIAGNOSIS — R4586 Emotional lability: Secondary | ICD-10-CM | POA: Diagnosis not present

## 2021-10-29 DIAGNOSIS — F02B3 Dementia in other diseases classified elsewhere, moderate, with mood disturbance: Secondary | ICD-10-CM | POA: Diagnosis not present

## 2021-11-07 ENCOUNTER — Other Ambulatory Visit: Payer: HMO | Admitting: Student

## 2021-11-07 DIAGNOSIS — F03911 Unspecified dementia, unspecified severity, with agitation: Secondary | ICD-10-CM | POA: Diagnosis not present

## 2021-11-07 DIAGNOSIS — R600 Localized edema: Secondary | ICD-10-CM | POA: Diagnosis not present

## 2021-11-07 DIAGNOSIS — Z515 Encounter for palliative care: Secondary | ICD-10-CM

## 2021-11-07 DIAGNOSIS — F320 Major depressive disorder, single episode, mild: Secondary | ICD-10-CM | POA: Diagnosis not present

## 2021-11-07 NOTE — Progress Notes (Signed)
[facility] Palliative Care Consult Note Telephone: [phone]  Fax: [phone]    Date of encounter: 11/07/21 2:11 PM PATIENT NAME: Yvonne Curtis [address] [place] [address]   [phone] (home)  DOB: 04-Oct-1950 MRN: [id number] PRIMARY CARE PROVIDER:    Steele Sizer, MD,  [address] [place] [address] [phone]  REFERRING PROVIDER:   Steele Sizer, [place],   [address] [phone]  RESPONSIBLE PARTY:    Contact Information     Name Relation Home Work [place] Spouse [phone]  [phone]   Tobie Poet Daughter [phone]          I met face to face with patient and family in the home. Palliative Care was asked to follow this patient by consultation request of  Steele Sizer, MD to address advance care planning and complex medical decision making. This is a follow up visit.                                   ASSESSMENT AND PLAN / RECOMMENDATIONS:   Advance Care Planning/Goals of Care: Goals include to maximize quality of life and symptom management. Patient/health care surrogate gave his/her permission to discuss. Our advance care planning conversation included a discussion about:    The value and importance of advance care planning  Experiences with loved ones who have been seriously ill or have died  Exploration of personal, cultural or spiritual beliefs that might influence medical decisions  Exploration of goals of care in the event of a sudden injury or illness  HCPOA- husband, daughter [name] and patient's sister Clare Charon Patient okay with hospitalization, no blood products.  CODE STATUS: Full Code  Education provided on Palliative Medicine. Will continue to provide supportive care, symptom management.   Symptom Management/Plan:  Alzheimer's Dementia- patient more forgetful, difficulty with word finding. Requires  cueing, redirection reorientation as needed. She is more withdrawn, not wanting to go out of house as much. Her Seroquel has been increased to 100 mg QHS and to be increased to 150 mg QHS after 7 days. Continue namenda, donepezil, Depakote as directed. Monitor for falls and safety. Encourage activities of interest, daily ambulating.   Depression- mood has been stable. Continue mirtazapine 15 mg nightly.  Edema-patient with 1+ LE edema. Husband to get compression stockings, encourage to elevate legs while in chair or bed.    Follow up Palliative Care Visit: Palliative care will continue to follow for complex medical decision making, advance care planning, and clarification of goals. Return in 8 weeks or prn.  This visit was coded based on medical decision making (MDM).  PPS: 60%  HOSPICE ELIGIBILITY/DIAGNOSIS: TBD  Chief Complaint: Palliative Medicine follow up visit.   HISTORY OF PRESENT ILLNESS:  Yvonne Curtis is a 71 y.o. year old female  with Alzheimer's dementia, depression with anxiety, type 2 diabetes, hypertension, CKD 3, protein calorie malnutrition, GERD.    Patient resides at home with her husband. She has increased forgetfulness, difficulty with word finding, sun downs. She also had increased difficulty sleeping; her Seroquel recently increased to 100 mg QHS. Good appetite endorsed. Patient with LE edema. No falls or injury. She is not wanting to go out as much. Husband contributes to HPI due to patient's dementia.  History obtained from review of EMR, discussion with primary team, and interview with family, facility staff/caregiver and/or  Ms. Klose.  I reviewed available labs, medications, imaging, studies and related documents from the EMR.  Records reviewed and summarized above.    Physical Exam:  Pulse 104, resp 20, b/p 128/68, sats 96% on room air Constitutional: NAD General: frail appearing EYES: anicteric sclera, lids intact, no discharge  ENMT: intact hearing,  oral mucous membranes moist, dentition intact CV: S1S2, RRR, 1+ LE edema Pulmonary: LCTA, no increased work of breathing, no cough, room air Abdomen: normo-active BS + 4 quadrants, soft and non tender GU: deferred MSK: moves all extremities, ambulatory Skin: warm and dry, no rashes or wounds on visible skin Neuro:  no generalized weakness, A & O x 2, forgetful Psych: non-anxious affect, pleasant Hem/lymph/immuno: no widespread bruising   Thank you for the opportunity to participate in the care of Ms. Chavous.  The palliative care team will continue to follow. Please call our office at [phone] if we can be of additional assistance.   Ezekiel Slocumb, NP   COVID-19 PATIENT SCREENING TOOL Asked and negative response unless otherwise noted:   Have you had symptoms of covid, tested positive or been in contact with someone with symptoms/positive test in the past 5-10 days? No

## 2021-11-28 ENCOUNTER — Telehealth: Payer: Self-pay

## 2021-11-28 NOTE — Telephone Encounter (Signed)
Patient's husband, Yvonne Curtis, came in to let you know Yvonne Curtis is having trouble swallowing her pills now. I advised he could mix it in with a drink/snack, but he didn't specify which medications. I know some you are not suppose to crush or if they can be prescribed as a liquid. Any advice?

## 2021-11-28 NOTE — Telephone Encounter (Signed)
Called patient's husband [name] and let him know to go by their pharmacy and ask which ones can be crushed. He said he will reach out for any medication he can't crush and that she can't swallow.

## 2021-12-03 DIAGNOSIS — R432 Parageusia: Secondary | ICD-10-CM | POA: Diagnosis not present

## 2021-12-03 DIAGNOSIS — R413 Other amnesia: Secondary | ICD-10-CM | POA: Diagnosis not present

## 2021-12-12 DIAGNOSIS — E119 Type 2 diabetes mellitus without complications: Secondary | ICD-10-CM | POA: Diagnosis not present

## 2021-12-12 LAB — HM DIABETES EYE EXAM

## 2021-12-19 NOTE — Progress Notes (Signed)
Name: Yvonne Curtis   MRN: [id number]    DOB: Jan 07, 1951   Date:12/20/2021       Progress Note  Subjective  Chief Complaint  Follow Up  HPI  DMII with renal manifestation: she has been off Metformin and  A1C  has been within normal limits.  She denies polyphagia, polyuria or polydipsia. She was taking ARB but stopped swallowing all medications 3 weeks ago, even when crushed she spits is out . She has seen nephrologist for [facility]   MDD: she was on Citalopram and was also given zoloft in the past,she has crying spells and seems sad at times. Husband is primary caregiver, she has been refusing to take any pills and spits it out if crushes. We will switch to liquid Remeron and depakote sprinkles to see if she tolerates and helps with her mood   Morbid obesity BMI above 35 with co-morbidities such as DM, HTN , CKI Weight is stable.   CKI stage III: seen by Dr. Lanora Manis - nephrologist . She is off HCTZ due to hypercalcemia , over the past 3 weeks off Micardis and Norvasc and bp is still at goal. Unable to switch to liquid medication. Advised to monitor bp at home They have a bp machine at home but she told her husband that she was not his [place] pig so he stopped checking it   Alzheimer's dementia with mood changes: seen by Dr. Melrose Nakayama neurologist and had a visit at [facility] for second opinion 09/22. She was taking Namenda, Aricepct, Remeron and  seroquel, however stopped taking all medications a few times a week and no major changes in her behavior. She is still continent but when she goes to the bathroom husband has to guide her so she can get undress and wipe, he bathes her and has to change her clothes, she goes to bed very late and wakes up in the middle of the night.  She is now getting palliative care.   Recurrent abdominal pain: she went to [facility] again 07/2021 and normal CT - CTA , symptoms resolved again , symptoms are intermittent and of unknown etiology , no complaints lately, husband thinks it is  due to Alzheimer's   Patient Active Problem List   Diagnosis Date Noted   Moderate late onset Alzheimer's dementia with mood disturbance ([place]) 12/20/2021   Seasonal allergic rhinitis 12/20/2021   Moderate protein-calorie malnutrition ([place]) 08/20/2021   Moderate episode of recurrent major depressive disorder ([place]) 08/20/2021   Cyst of kidney, acquired 07/04/2021   Anemia in chronic kidney disease 02/07/2021   Pure hypercholesterolemia, unspecified 02/07/2021   Hypercalcemia 10/11/2020   Chronic kidney disease, stage 3b ([place]) 09/04/2020   Dysphagia    History of colonic polyps    Polyp of transverse colon    Abnormal sense of taste 12/19/2019   Loss of memory 12/19/2019   Mood changes 12/19/2019   Acanthosis nigricans 11/14/2014   Benign hypertension 11/14/2014   Dyslipidemia 11/14/2014   Gastro-esophageal reflux disease without esophagitis 11/14/2014   Morbid obesity ([place]) 11/14/2014   Primary osteoarthritis of both knees 11/14/2014   Microalbuminuria 10/15/2012   Controlled type 2 diabetes mellitus with stage 3 chronic kidney disease, without long-term current use of insulin ([place]) 06/05/2009   Body dermatophytosis 12/13/2008    Past Surgical History:  Procedure Laterality Date   CHOLECYSTECTOMY     COLONOSCOPY WITH PROPOFOL N/A 11/30/2014   Procedure: COLONOSCOPY WITH PROPOFOL;  Surgeon: Lucilla Lame, MD;  Location: [place];  Service:  Endoscopy;  Laterality: N/A;  WITH BIOPSY-- TRANSVERSE COLON POLYP  X 2 DESCENING COLON POLYP   COLONOSCOPY WITH PROPOFOL N/A 01/17/2020   Procedure: COLONOSCOPY WITH PROPOFOL;  Surgeon: Lucilla Lame, MD;  Location: [facility] ENDOSCOPY;  Service: Endoscopy;  Laterality: N/A;   ESOPHAGOGASTRODUODENOSCOPY (EGD) WITH PROPOFOL N/A 08/23/2020   Procedure: ESOPHAGOGASTRODUODENOSCOPY (EGD) WITH PROPOFOL;  Surgeon: Lucilla Lame, MD;  Location: [facility] ENDOSCOPY;  Service: Endoscopy;  Laterality: N/A;   GALLBLADDER SURGERY     SEPTOPLASTY      tonsillectomy     TONSILLECTOMY     TUBAL LIGATION      Family History  Problem Relation Age of Onset   Colon cancer Mother    Lung cancer Father    Hypertension Sister    Hypertension Sister    Breast cancer Neg Hx     Social History   Tobacco Use   Smoking status: Never   Smokeless tobacco: Never  Substance Use Topics   Alcohol use: No    Alcohol/week: 0.0 standard drinks of alcohol     Current Outpatient Medications:    blood glucose meter kit and supplies, Dispense based on patient and insurance preference. Use once daily as directed. (FOR ICD-10 E10.9, E11.9). STrips 100 Lancets 100, Disp: 1 each, Rfl: 0   cetirizine HCl (ZYRTEC) 5 MG/5ML SOLN, Take 10 mLs (10 mg total) by mouth daily as needed for allergies., Disp: 300 mL, Rfl: 2   divalproex (DEPAKOTE SPRINKLES) 125 MG capsule, Take 1 capsule (125 mg total) by mouth 2 (two) times daily., Disp: 180 capsule, Rfl: 1   Lancets (ONETOUCH DELICA PLUS [id number]) MISC, Use as directed to check blood sugar, Disp: , Rfl:    mirtazapine (REMERON SOL-TAB) 15 MG disintegrating tablet, Take 1 tablet (15 mg total) by mouth at bedtime., Disp: 90 tablet, Rfl: 1   ONETOUCH ULTRA test strip, 1 each by Other route daily. Use as directed to check blood sugar, Disp: 100 each, Rfl: 2  Allergies  Allergen Reactions   Oxycodone Other (See Comments)   Oxycodone Hcl     insomnia, agitation    I personally reviewed active problem list, medication list, allergies, family history, social history, health maintenance with the patient/caregiver today.   ROS  Ten systems reviewed and is negative except as mentioned in HPI  Given by husband and daughter   Objective  Vitals:   12/20/21 0913  BP: 128/78  Pulse: 98  Resp: 16  Temp: 97.6 F (36.4 C)  TempSrc: Oral  SpO2: 100%  Weight: 202 lb 12.8 oz (92 kg)  Height: 5' (1.524 m)    Body mass index is 39.61 kg/m.  Physical Exam  Constitutional: Patient appears well-developed and  well-nourished. Obese  No distress.  HEENT: head atraumatic, normocephalic, pupils equal and reactive to light, neck supple Cardiovascular: Normal rate, regular rhythm and normal heart sounds.  No murmur heard. Trace  BLE edema. Pulmonary/Chest: Effort normal and breath sounds normal. No respiratory distress. Abdominal: Soft.  There is no tenderness. Psychiatric: Smiling and cooperative, looks at husband or daughter when I asked her questions.   PHQ2/9:    12/20/2021    9:25 AM 10/24/2021   10:53 AM 08/20/2021   11:22 AM 07/08/2021    2:58 PM 04/18/2021    8:56 AM  Depression screen PHQ 2/9  Decreased Interest 0 0 _0 Down, Depressed, Hopeless 0 0 2 0 1  PHQ - 2 Score 0 0 _1 Altered sleeping 0  2 0 0  Tired, decreased energy 0  0 1 0  Change in appetite 0  0 0 3  Feeling bad or failure about yourself  0  0 0 0  Trouble concentrating 0  0 1 3  Moving slowly or fidgety/restless 0  0 1 0  Suicidal thoughts 0  0 0 0  PHQ-9 Score 0  _0 Difficult doing work/chores     Not difficult at all    phq 9 is negative   Fall Risk:    12/20/2021    9:25 AM 10/24/2021   10:59 AM 08/20/2021   11:13 AM 07/08/2021    2:58 PM 04/18/2021    8:56 AM  Fall Risk   Falls in the past year? 1 0 _1 Number falls in past yr: 1 0 _2 Injury with Fall? 0 0 0 0 1  Risk for fall due to : History of fall(s);Impaired balance/gait;Mental status change Impaired balance/gait Impaired balance/gait History of fall(s) Impaired balance/gait;History of fall(s)  Follow up Falls prevention discussed;Education provided;Falls evaluation completed Falls prevention discussed Falls prevention discussed Falls evaluation completed Falls prevention discussed;Falls evaluation completed      Functional Status Survey: Is the patient deaf or have difficulty hearing?: No Does the patient have difficulty seeing, even when wearing glasses/contacts?: No Does the patient have difficulty concentrating, remembering, or  making decisions?: Yes Does the patient have difficulty walking or climbing stairs?: Yes Does the patient have difficulty dressing or bathing?: Yes Does the patient have difficulty doing errands alone such as visiting a doctor's office or shopping?: Yes    Assessment & Plan  1. Controlled type 2 diabetes mellitus with stage 3 chronic kidney disease, without long-term current use of insulin (HCC)  - POCT HgB A1C  2. Moderate late onset Alzheimer's dementia with mood disturbance (HCC)  - mirtazapine (REMERON SOL-TAB) 15 MG disintegrating tablet; Take 1 tablet (15 mg total) by mouth at bedtime.  Dispense: 90 tablet; Refill: 1 - divalproex (DEPAKOTE SPRINKLES) 125 MG capsule; Take 1 capsule (125 mg total) by mouth 2 (two) times daily.  Dispense: 180 capsule; Refill: 1  Advised to contact Dr. Melrose Nakayama and consider switching to Exelon patch to increase compliance, unable to swallow tablets or capsules even when crushes   Eating her meals without problems   3. Chronic kidney disease, stage 3b (HCC)  Off ARB because not swallowing medications   4. Morbid obesity ([place])  Discussed with the patient the risk posed by an increased BMI. Discussed importance of portion control, calorie counting and at least 150 minutes of physical activity weekly. Avoid sweet beverages and drink more water. Eat at least 6 servings of fruit and vegetables daily    5. Seasonal allergic rhinitis, unspecified trigger  - cetirizine HCl (ZYRTEC) 5 MG/5ML SOLN; Take 10 mLs (10 mg total) by mouth daily as needed for allergies.  Dispense: 300 mL; Refill: 2

## 2021-12-20 ENCOUNTER — Ambulatory Visit (INDEPENDENT_AMBULATORY_CARE_PROVIDER_SITE_OTHER): Payer: HMO | Admitting: Family Medicine

## 2021-12-20 ENCOUNTER — Encounter: Payer: Self-pay | Admitting: Family Medicine

## 2021-12-20 VITALS — BP 128/78 | HR 98 | Temp 97.6°F | Resp 16 | Ht 60.0 in | Wt 202.8 lb

## 2021-12-20 DIAGNOSIS — N183 Chronic kidney disease, stage 3 unspecified: Secondary | ICD-10-CM | POA: Diagnosis not present

## 2021-12-20 DIAGNOSIS — F02B3 Dementia in other diseases classified elsewhere, moderate, with mood disturbance: Secondary | ICD-10-CM | POA: Diagnosis not present

## 2021-12-20 DIAGNOSIS — J302 Other seasonal allergic rhinitis: Secondary | ICD-10-CM | POA: Diagnosis not present

## 2021-12-20 DIAGNOSIS — E1122 Type 2 diabetes mellitus with diabetic chronic kidney disease: Secondary | ICD-10-CM

## 2021-12-20 DIAGNOSIS — N1832 Chronic kidney disease, stage 3b: Secondary | ICD-10-CM | POA: Diagnosis not present

## 2021-12-20 DIAGNOSIS — G301 Alzheimer's disease with late onset: Secondary | ICD-10-CM | POA: Diagnosis not present

## 2021-12-20 LAB — POCT GLYCOSYLATED HEMOGLOBIN (HGB A1C): HbA1c POC (<> result, manual entry): 5.8 % (ref 4.0–5.6)

## 2021-12-20 MED ORDER — MIRTAZAPINE 15 MG PO TBDP: 15.0000 mg | ORAL_TABLET | Freq: Every day | ORAL | 1 refills | Status: DC

## 2021-12-20 MED ORDER — CETIRIZINE HCL 5 MG/5ML PO SOLN: 10.0000 mg | Freq: Every day | ORAL | 2 refills | Status: DC | PRN

## 2021-12-20 MED ORDER — DIVALPROEX SODIUM 125 MG PO CSDR: 125.0000 mg | DELAYED_RELEASE_CAPSULE | Freq: Two times a day (BID) | ORAL | 1 refills | Status: DC

## 2021-12-20 NOTE — Patient Instructions (Signed)
Discussed Exelon patch

## 2021-12-25 DIAGNOSIS — E0590 Thyrotoxicosis, unspecified without thyrotoxic crisis or storm: Secondary | ICD-10-CM | POA: Diagnosis not present

## 2021-12-25 DIAGNOSIS — I152 Hypertension secondary to endocrine disorders: Secondary | ICD-10-CM | POA: Diagnosis not present

## 2021-12-25 DIAGNOSIS — E876 Hypokalemia: Secondary | ICD-10-CM | POA: Diagnosis not present

## 2021-12-25 DIAGNOSIS — E042 Nontoxic multinodular goiter: Secondary | ICD-10-CM | POA: Diagnosis not present

## 2021-12-30 ENCOUNTER — Ambulatory Visit: Payer: Self-pay | Admitting: *Deleted

## 2021-12-30 ENCOUNTER — Encounter: Payer: Self-pay | Admitting: Family Medicine

## 2021-12-30 ENCOUNTER — Ambulatory Visit (INDEPENDENT_AMBULATORY_CARE_PROVIDER_SITE_OTHER): Payer: HMO | Admitting: Family Medicine

## 2021-12-30 VITALS — BP 150/82 | HR 100 | Temp 98.0°F | Resp 16 | Ht 60.0 in | Wt 209.0 lb

## 2021-12-30 DIAGNOSIS — R300 Dysuria: Secondary | ICD-10-CM | POA: Diagnosis not present

## 2021-12-30 MED ORDER — CEFTRIAXONE SODIUM 500 MG IJ SOLR
500.0000 mg | Freq: Once | INTRAMUSCULAR | Status: AC
  Administered 2021-12-30: 500 mg via INTRAMUSCULAR

## 2021-12-30 MED ORDER — LIDOCAINE HCL (PF) 1 % IJ SOLN
2.0000 mL | Freq: Once | INTRAMUSCULAR | Status: AC
  Administered 2021-12-30: 2 mL via INTRADERMAL

## 2021-12-30 NOTE — Telephone Encounter (Signed)
  Chief Complaint: Dysuria Symptoms: "Maybe blood in urine, it's dark" Dysuria, urgency, incontinence at times Frequency: Pain yesterday, unsure how long urine dark in color Pertinent Negatives: Patient denies fever. Disposition: '[]'$ ED /'[]'$ Urgent Care (no appt availability in office) / '[x]'$ Appointment(In office/virtual)/ '[]'$  [facility]/ '[]'$ Home Care/ '[]'$ Refused Recommended Disposition /'[]'$ [facility]/ '[]'$  Follow-up with PCP Additional Notes: Information from husband, did speak with pt, H/O Alzheimers. Appt secured for today. Reason for Disposition  Age > 50 years  Answer Assessment - Initial Assessment Questions 1. SEVERITY: "How bad is the pain?"  (e.g., Scale 1-10; mild, moderate, or severe)   - MILD (1-3): complains slightly about urination hurting   - MODERATE (4-7): interferes with normal activities     - SEVERE (8-10): excruciating, unwilling or unable to urinate because of the pain      Unsure 2. FREQUENCY: "How many times have you had painful urination today?"      Urgency 3. PATTERN: "Is pain present every time you urinate or just sometimes?"      Yes 4. ONSET: "When did the painful urination start?"      Yesterday 5. FEVER: "Do you have a fever?" If Yes, ask: "What is your temperature, how was it measured, and when did it start?"     no 6. PAST UTI: "Have you had a urine infection before?" If Yes, ask: "When was the last time?" and "What happened that time?"      Yes 7. CAUSE: "What do you think is causing the painful urination?"  (e.g., UTI, scratch, Herpes sore)     Unsure 8. OTHER SYMPTOMS: "Do you have any other symptoms?" (e.g., blood in urine, flank pain, genital sores, urgency, vaginal discharge)     "Maybe blood"  Protocols used: Urination Pain - Female-A-AH

## 2021-12-30 NOTE — Progress Notes (Signed)
Name: Yvonne Curtis   MRN: [id number]    DOB: 1951-01-03   Date:12/30/2021       Progress Note  Subjective  Chief Complaint  Dysuria  HPI  Dysuria: patient has dementia , husband is her caregiver and noticed strong urine color and odor over the past week, yesterday the patient complained of dysuria. No fever or chills. No abdominal pain.   She refuses to take oral medications and was unable to give [place] an urine specimen today.   We will give her a Rocephin injection and husband will try to bring her specimen this afternoon  Patient Active Problem List   Diagnosis Date Noted   Moderate late onset Alzheimer's dementia with mood disturbance ([place]) 12/20/2021   Seasonal allergic rhinitis 12/20/2021   Moderate protein-calorie malnutrition ([place]) 08/20/2021   Moderate episode of recurrent major depressive disorder ([place]) 08/20/2021   Cyst of kidney, acquired 07/04/2021   Anemia in chronic kidney disease 02/07/2021   Pure hypercholesterolemia, unspecified 02/07/2021   Hypercalcemia 10/11/2020   Chronic kidney disease, stage 3b ([place]) 09/04/2020   Dysphagia    History of colonic polyps    Polyp of transverse colon    Abnormal sense of taste 12/19/2019   Loss of memory 12/19/2019   Mood changes 12/19/2019   Acanthosis nigricans 11/14/2014   Benign hypertension 11/14/2014   Dyslipidemia 11/14/2014   Gastro-esophageal reflux disease without esophagitis 11/14/2014   Morbid obesity ([place]) 11/14/2014   Primary osteoarthritis of both knees 11/14/2014   Microalbuminuria 10/15/2012   Controlled type 2 diabetes mellitus with stage 3 chronic kidney disease, without long-term current use of insulin ([place]) 06/05/2009   Body dermatophytosis 12/13/2008    Past Surgical History:  Procedure Laterality Date   CHOLECYSTECTOMY     COLONOSCOPY WITH PROPOFOL N/A 11/30/2014   Procedure: COLONOSCOPY WITH PROPOFOL;  Surgeon: Lucilla Lame, MD;  Location: [place];  Service: Endoscopy;   Laterality: N/A;  WITH BIOPSY-- TRANSVERSE COLON POLYP  X 2 DESCENING COLON POLYP   COLONOSCOPY WITH PROPOFOL N/A 01/17/2020   Procedure: COLONOSCOPY WITH PROPOFOL;  Surgeon: Lucilla Lame, MD;  Location: [facility] ENDOSCOPY;  Service: Endoscopy;  Laterality: N/A;   ESOPHAGOGASTRODUODENOSCOPY (EGD) WITH PROPOFOL N/A 08/23/2020   Procedure: ESOPHAGOGASTRODUODENOSCOPY (EGD) WITH PROPOFOL;  Surgeon: Lucilla Lame, MD;  Location: [facility] ENDOSCOPY;  Service: Endoscopy;  Laterality: N/A;   GALLBLADDER SURGERY     SEPTOPLASTY     tonsillectomy     TONSILLECTOMY     TUBAL LIGATION      Family History  Problem Relation Age of Onset   Colon cancer Mother    Lung cancer Father    Hypertension Sister    Hypertension Sister    Breast cancer Neg Hx     Social History   Tobacco Use   Smoking status: Never   Smokeless tobacco: Never  Substance Use Topics   Alcohol use: No    Alcohol/week: 0.0 standard drinks of alcohol     Current Outpatient Medications:    blood glucose meter kit and supplies, Dispense based on patient and insurance preference. Use once daily as directed. (FOR ICD-10 E10.9, E11.9). STrips 100 Lancets 100, Disp: 1 each, Rfl: 0   cetirizine HCl (ZYRTEC) 5 MG/5ML SOLN, Take 10 mLs (10 mg total) by mouth daily as needed for allergies., Disp: 300 mL, Rfl: 2   divalproex (DEPAKOTE SPRINKLES) 125 MG capsule, Take 1 capsule (125 mg total) by mouth 2 (two) times daily., Disp: 180 capsule, Rfl: 1   Lancets (  ONETOUCH DELICA PLUS [id number]) MISC, Use as directed to check blood sugar, Disp: , Rfl:    mirtazapine (REMERON SOL-TAB) 15 MG disintegrating tablet, Take 1 tablet (15 mg total) by mouth at bedtime., Disp: 90 tablet, Rfl: 1   ONETOUCH ULTRA test strip, 1 each by Other route daily. Use as directed to check blood sugar, Disp: 100 each, Rfl: 2  Allergies  Allergen Reactions   Oxycodone Other (See Comments)   Oxycodone Hcl     insomnia, agitation    I personally reviewed active problem  list, medication list, allergies, family history, social history, health maintenance with the patient/caregiver today.   ROS  Ten systems reviewed and is negative except as mentioned in HPI   Objective  Vitals:   12/30/21 1103 12/30/21 1125  BP: (!) 160/84 (!) 150/82  Pulse: 100   Resp: 16   Temp: 98 F (36.7 C)   TempSrc: Oral   SpO2: 97%   Weight: 209 lb (94.8 kg)   Height: 5' (1.524 m)     Body mass index is 40.82 kg/m.  Physical Exam  Constitutional: Patient appears well-developed and well-nourished. Obese  No distress.  HEENT: head atraumatic, normocephalic, pupils equal and reactive to light, neck supple, throat within normal limits Cardiovascular: Normal rate, regular rhythm and normal heart sounds.  No murmur heard. No BLE edema. Pulmonary/Chest: Effort normal and breath sounds normal. No respiratory distress. Abdominal: Soft.  There is no tenderness. Negative CVA Psychiatric: Patient has a normal mood and affect. behavior is normal. Judgment and thought content normal.   Recent Results (from the past 2160 hour(s))  HM DIABETES EYE EXAM     Status: None   Collection Time: 12/12/21 12:00 AM  Result Value Ref Range   HM Diabetic Eye Exam No Retinopathy No Retinopathy  POCT HgB A1C     Status: Abnormal   Collection Time: 12/20/21 10:00 AM  Result Value Ref Range   Hemoglobin A1C     HbA1c POC (<> result, manual entry) 5.8 4.0 - 5.6 %   HbA1c, POC (prediabetic range)     HbA1c, POC (controlled diabetic range)      PHQ2/9:    12/30/2021   11:03 AM 12/20/2021    9:25 AM 10/24/2021   10:53 AM 08/20/2021   11:22 AM 07/08/2021    2:58 PM  Depression screen PHQ 2/9  Decreased Interest 0 0 0 3 3  Down, Depressed, Hopeless 0 0 0 2 0  PHQ - 2 Score 0 0 0 5 3  Altered sleeping 0 0  2 0  Tired, decreased energy 0 0  0 1  Change in appetite 0 0  0 0  Feeling bad or failure about yourself  0 0  0 0  Trouble concentrating 0 0  0 1  Moving slowly or fidgety/restless 0  0  0 1  Suicidal thoughts 0 0  0 0  PHQ-9 Score 0 0  7 6  Difficult doing work/chores Not difficult at all        phq 9 is negative   Fall Risk:    12/30/2021   11:03 AM 12/20/2021    9:25 AM 10/24/2021   10:59 AM 08/20/2021   11:13 AM 07/08/2021    2:58 PM  Fall Risk   Falls in the past year? 1 1 0 1 1  Number falls in past yr: 1 1 0 1 1  Injury with Fall? 1 0 0 0 0  Risk for fall due  to : Impaired balance/gait;Impaired mobility History of fall(s);Impaired balance/gait;Mental status change Impaired balance/gait Impaired balance/gait History of fall(s)  Follow up Falls prevention discussed;Education provided Falls prevention discussed;Education provided;Falls evaluation completed Falls prevention discussed Falls prevention discussed Falls evaluation completed      Functional Status Survey: Is the patient deaf or have difficulty hearing?: No Does the patient have difficulty seeing, even when wearing glasses/contacts?: No Does the patient have difficulty concentrating, remembering, or making decisions?: Yes Does the patient have difficulty walking or climbing stairs?: Yes Does the patient have difficulty dressing or bathing?: Yes Does the patient have difficulty doing errands alone such as visiting a doctor's office or shopping?: Yes    Assessment & Plan  1. Dysuria  - Urine Culture - cefTRIAXone (ROCEPHIN) injection 500 mg - lidocaine (PF) (XYLOCAINE) 1 % injection 2 mL

## 2021-12-31 LAB — URINE CULTURE
MICRO NUMBER:: 13841066
Result:: NO GROWTH
SPECIMEN QUALITY:: ADEQUATE

## 2022-01-09 ENCOUNTER — Other Ambulatory Visit: Payer: HMO | Admitting: Student

## 2022-01-09 DIAGNOSIS — Z515 Encounter for palliative care: Secondary | ICD-10-CM

## 2022-01-09 DIAGNOSIS — F02B3 Dementia in other diseases classified elsewhere, moderate, with mood disturbance: Secondary | ICD-10-CM

## 2022-01-09 DIAGNOSIS — G301 Alzheimer's disease with late onset: Secondary | ICD-10-CM | POA: Diagnosis not present

## 2022-01-09 DIAGNOSIS — F339 Major depressive disorder, recurrent, unspecified: Secondary | ICD-10-CM | POA: Diagnosis not present

## 2022-01-09 NOTE — Progress Notes (Signed)
[facility] Consult Note Telephone: [phone]  Fax: [phone]    Date of encounter: 01/09/22 2:05 PM PATIENT NAME: Yvonne Curtis [address] [place] [address]   [phone] (home)  DOB: 02/08/1951 MRN: [id number] PRIMARY CARE PROVIDER:    Steele Sizer, MD,  [address] [place] [address] [phone]  REFERRING PROVIDER:   Steele Sizer, [place],  [place] [address] [phone]  RESPONSIBLE PARTY:    Contact Information     Name Relation Home Work [place] Spouse [phone]  [phone]   Tobie Poet Daughter [phone]          I met face to face with patient and family in the home. Palliative Care was asked to follow this patient by consultation request of  Steele Sizer, MD to address advance care planning and complex medical decision making. This is a follow up visit.                                   ASSESSMENT AND PLAN / RECOMMENDATIONS:   Advance Care Planning/Goals of Care: Goals include to maximize quality of life and symptom management. Patient/health care surrogate gave his/her permission to discuss. Our advance care planning conversation included a discussion about:    The value and importance of advance care planning  Experiences with loved ones who have been seriously ill or have died  Exploration of personal, cultural or spiritual beliefs that might influence medical decisions  CODE STATUS: Full Code  Education provided on Palliative Medicine. Will continue to provide supportive care, symptom management as needed.   Symptom Management/Plan:  Alzheimer's dementia-patient with increased forgetfulness, more difficulty with word finding. She was having difficulty taking pills. She is now only taking Depakote sprinkles, mirtazapine disintegrating tablets and Exelon patch. Reorient and redirect as needed. Keep daily  routine. Recommend revisiting day program to see if patient would be willing to attend. Husband denies need for additional support in the home.   Depression-mood stable. Continue mirtazapine 15 mg QHS.   Follow up Palliative Care Visit: Palliative care will continue to follow for complex medical decision making, advance care planning, and clarification of goals. Return in 8 weeks or prn.   This visit was coded based on medical decision making (MDM).  PPS: 50%  HOSPICE ELIGIBILITY/DIAGNOSIS: TBD  Chief Complaint: Palliative Medicine follow up visit.   HISTORY OF PRESENT ILLNESS:  Yvonne Curtis is a 71 y.o. year old female  with Alzheimer's dementia, depression with anxiety, type 2 diabetes, hypertension, CKD 3, protein calorie malnutrition, GERD.     Patient was having increased difficulty taking pills; medications reduced. She is able to eat and drink regular consistency foods with no difficulty. She denies pain, shortness of breath. Her appetite has been good. She is going out of the house less. She naps intermittently during the day, sleeping well at night. No falls reported. Husband assist with daily care needs.   Patient received in living room. Pleasant affect. She has increased difficulty with word finding since last visit. She is cooperative with assessment. HPI obtained from family due to her dementia.   History obtained from review of EMR, discussion with primary team, and interview with family, facility staff/caregiver and/or Yvonne Curtis.  I reviewed available labs, medications, imaging, studies and related documents from the EMR.  Records reviewed and summarized above.  ROS  Unable to substantially contribute d/t dementia.   Physical Exam: Pulse 96, resp 16, bp 148/96 sats 91% on room air Constitutional: NAD General: frail appearing  EYES: anicteric sclera, lids intact, no discharge  ENMT: intact hearing, oral mucous membranes moist, dentition intact CV: S1S2, RRR,  1+ LE edema Pulmonary: LCTA, no increased work of breathing, no cough, room air Abdomen: normo-active BS + 4 quadrants, soft and non tender GU: deferred MSK: moves all extremities, ambulatory Skin: warm and dry, no rashes or wounds on visible skin Neuro: +generalized weakness, A & O x 2 Psych: non-anxious affect Hem/lymph/immuno: no widespread bruising   Thank you for the opportunity to participate in the care of Yvonne Curtis.  The palliative care team will continue to follow. Please call our office at [phone] if we can be of additional assistance.    S , NP   COVID-19 PATIENT SCREENING TOOL Asked and negative response unless otherwise noted:   Have you had symptoms of covid, tested positive or been in contact with someone with symptoms/positive test in the past 5-10 days? No  

## 2022-01-21 ENCOUNTER — Other Ambulatory Visit: Payer: Self-pay | Admitting: Family Medicine

## 2022-01-21 DIAGNOSIS — I10 Essential (primary) hypertension: Secondary | ICD-10-CM | POA: Diagnosis not present

## 2022-01-21 DIAGNOSIS — R809 Proteinuria, unspecified: Secondary | ICD-10-CM | POA: Diagnosis not present

## 2022-01-21 DIAGNOSIS — N1832 Chronic kidney disease, stage 3b: Secondary | ICD-10-CM | POA: Diagnosis not present

## 2022-01-21 DIAGNOSIS — F03911 Unspecified dementia, unspecified severity, with agitation: Secondary | ICD-10-CM | POA: Diagnosis not present

## 2022-01-21 DIAGNOSIS — E1122 Type 2 diabetes mellitus with diabetic chronic kidney disease: Secondary | ICD-10-CM | POA: Diagnosis not present

## 2022-01-21 DIAGNOSIS — N281 Cyst of kidney, acquired: Secondary | ICD-10-CM | POA: Diagnosis not present

## 2022-01-21 DIAGNOSIS — E785 Hyperlipidemia, unspecified: Secondary | ICD-10-CM | POA: Diagnosis not present

## 2022-01-21 DIAGNOSIS — E7800 Pure hypercholesterolemia, unspecified: Secondary | ICD-10-CM | POA: Diagnosis not present

## 2022-01-21 DIAGNOSIS — D631 Anemia in chronic kidney disease: Secondary | ICD-10-CM | POA: Diagnosis not present

## 2022-01-24 ENCOUNTER — Other Ambulatory Visit: Payer: Self-pay | Admitting: Family Medicine

## 2022-01-24 DIAGNOSIS — I10 Essential (primary) hypertension: Secondary | ICD-10-CM

## 2022-01-27 ENCOUNTER — Other Ambulatory Visit: Payer: Self-pay | Admitting: Family Medicine

## 2022-01-27 ENCOUNTER — Telehealth: Payer: Self-pay

## 2022-01-27 MED ORDER — TELMISARTAN 80 MG PO TABS
80.0000 mg | ORAL_TABLET | Freq: Every day | ORAL | 0 refills | Status: DC
Start: 2022-01-27 — End: 2022-08-12

## 2022-01-27 NOTE — Telephone Encounter (Signed)
I can tee up rx if needed

## 2022-01-30 NOTE — Telephone Encounter (Signed)
Pts husband came back into the office today and states that pt still needs a refill on the amlodipine to be sent to [address]. Please advise to pt once this is done

## 2022-01-31 ENCOUNTER — Other Ambulatory Visit: Payer: Self-pay | Admitting: Family Medicine

## 2022-01-31 NOTE — Telephone Encounter (Signed)
Called Mr. Dombek and let him know to return in 1 week for a bp check to see if we need to add Amlodipine. He gave verbal understanding.

## 2022-02-07 ENCOUNTER — Ambulatory Visit: Payer: HMO

## 2022-02-07 ENCOUNTER — Other Ambulatory Visit: Payer: Self-pay

## 2022-02-07 VITALS — BP 148/86

## 2022-02-07 DIAGNOSIS — I10 Essential (primary) hypertension: Secondary | ICD-10-CM

## 2022-02-07 MED ORDER — AMLODIPINE BESYLATE 2.5 MG PO TABS: 2.5000 mg | ORAL_TABLET | Freq: Every day | ORAL | 0 refills | Status: DC

## 2022-02-28 ENCOUNTER — Telehealth: Payer: Self-pay | Admitting: Family Medicine

## 2022-02-28 NOTE — Telephone Encounter (Signed)
Tried to reach Yvonne Curtis via telephone, no voicemail set up to leave a message. Will put in CRM in case he sees missed call and calls back over the weekend.

## 2022-02-28 NOTE — Telephone Encounter (Signed)
Husband Yvonne Curtis stopped by and wanted to inform you he noticed last night that when she talk to herself she has began to answer herself in a different language (almost alien like) and in like a humming/whistle type of way. She would ask herself a question and answer in alien like form. Stated last night was really bad she did it all night. Please advise

## 2022-03-06 ENCOUNTER — Other Ambulatory Visit: Payer: HMO | Admitting: Student

## 2022-03-06 DIAGNOSIS — F339 Major depressive disorder, recurrent, unspecified: Secondary | ICD-10-CM

## 2022-03-06 DIAGNOSIS — Z515 Encounter for palliative care: Secondary | ICD-10-CM

## 2022-03-06 DIAGNOSIS — F02B3 Dementia in other diseases classified elsewhere, moderate, with mood disturbance: Secondary | ICD-10-CM

## 2022-03-06 NOTE — Progress Notes (Signed)
[facility] Consult Note Telephone: [phone]  Fax: [phone]    Date of encounter: 03/06/22 2:01 PM PATIENT NAME: Yvonne Curtis [address] [place] [address]   [phone] (home)  DOB: [birth year] MRN: [id number] PRIMARY CARE PROVIDER:    Steele Sizer, MD,  [address] [place] [address] [phone]  REFERRING PROVIDER:   Steele Sizer, [place],  [place] [address] [phone]  RESPONSIBLE PARTY:    Contact Information     Name Relation Home Work [place] Spouse [phone]  [phone]   Tobie Poet Daughter [phone]          I met face to face with patient and family in the home. Palliative Care was asked to follow this patient by consultation request of  Steele Sizer, MD to address advance care planning and complex medical decision making. This is a follow up visit.                                   ASSESSMENT AND PLAN / RECOMMENDATIONS:   Advance Care Planning/Goals of Care: Goals include to maximize quality of life and symptom management. Patient/health care surrogate gave his/her permission to discuss. Our advance care planning conversation included a discussion about:    The value and importance of advance care planning  Experiences with loved ones who have been seriously ill or have died  Exploration of personal, cultural or spiritual beliefs that might influence medical decisions  Exploration of goals of care in the event of a sudden injury or illness  CODE STATUS: Full Code  Education provided on Palliative Medicine. Palliative medicine will sign off due to not meeting criteria at this time. Husband is made aware to notify Palliative Medicine should she decline and need hospice services.   Symptom Management/Plan:  Alzheimer's dementia-patient requires cueing, redirection. She does have increased difficulty with  word finding. She prefers to be in the home, not going out as much. No functional declines reported. No trouble with chewing/swallowing foods. She does have difficulty taking pills. Continue Depakote sprinkles, mirtazapine disintegrating tablets and Exelon patch. Reorient and redirect as needed. Keep daily routine. Family to continue providing supportive care.  Depression-mood is stable. Continue mirtazapine 15 mg QHS. Encourage activities of interest.    Follow up Palliative Care Visit: Palliative care will continue to follow for complex medical decision making, advance care planning, and clarification of goals. Return prn.  I spent 40 minutes providing this consultation. More than 50% of the time in this consultation was spent in counseling and care coordination.   PPS: 50%  HOSPICE ELIGIBILITY/DIAGNOSIS: TBD  Chief Complaint: Palliative Medicine follow up visit.   HISTORY OF PRESENT ILLNESS:  Yvonne Curtis is a 71 y.o. year old female  with Alzheimer's dementia, depression with anxiety, type 2 diabetes, hypertension, CKD 3, protein calorie malnutrition, GERD.       Patient states she is okay; husband states she has been well although she has now started talking back to herself at night. She denies any urinary complaints. Denies pain, shortness of breath, nausea, constipation. Her blood pressure medications were restarted back in October. Appetite is fair; drinking supplemental shake every other day, depending on what she eats. Her weight has been stable. No falls. No recent ED visits or hospitalizations. Patient is going out of the house less; her sister is visiting  today.   History obtained from review of EMR, discussion with primary team, and interview with family, facility staff/caregiver and/or [name].  I reviewed available labs, medications, imaging, studies and related documents from the EMR.  Records reviewed and summarized above.   ROS  Unable to substantially contribute  d/t dementia.   Physical Exam: Pulse 82, resp 20, b/p 136/80, sats 95-96% on room air Constitutional: NAD General: frail appearing EYES: anicteric sclera, lids intact, no discharge  ENMT: intact hearing, oral mucous membranes moist, dentition intact CV: S1S2, RRR, no LE edema Pulmonary: LCTA, no increased work of breathing, no cough, room air Abdomen: normo-active BS + 4 quadrants, soft and non tender, no ascites GU: deferred MSK: no sarcopenia, moves all extremities, ambulatory Skin: warm and dry, no rashes or wounds on visible skin Neuro: +generalized weakness,  + cognitive impairment Psych: non-anxious affect, A and O x 2 Hem/lymph/immuno: no widespread bruising   Thank you for the opportunity to participate in the care of Yvonne Curtis.  The palliative care team will continue to follow. Please call our office at [phone] if we can be of additional assistance.   Ezekiel Slocumb, NP   COVID-19 PATIENT SCREENING TOOL Asked and negative response unless otherwise noted:   Have you had symptoms of covid, tested positive or been in contact with someone with symptoms/positive test in the past 5-10 days? No

## 2022-04-02 ENCOUNTER — Ambulatory Visit: Payer: Self-pay | Admitting: *Deleted

## 2022-04-02 NOTE — Telephone Encounter (Signed)
  Chief Complaint: constipation Symptoms: no BM since Friday- feels pressure- but unable to move bowels, abdominal discomfort Frequency: 5 days Pertinent Negatives: Patient denies   Disposition: '[]'$ ED /'[]'$ Urgent Care (no appt availability in office) / '[x]'$ Appointment(In office/virtual)/ '[]'$  [facility]/ '[]'$ Home Care/ '[]'$ Refused Recommended Disposition /'[]'$ [facility]/ '[]'$  Follow-up with PCP Additional Notes: Patient was given dulcolax today, advised sitz bath , increased fluid intake

## 2022-04-02 NOTE — Telephone Encounter (Signed)
Summary: pt has not had a bowel movement since last Friday and he would like to know about medication   Pt spouse reports that pt has not had a bowel movement since last Friday and he would like to know what medication pt can be given to help. Cb# [phone]         Reason for Disposition  Last bowel movement (BM) > 4 days ago  Answer Assessment - Initial Assessment Questions 1. STOOL PATTERN OR FREQUENCY: "How often do you have a bowel movement (BM)?"  (Normal range: 3 times a day to every 3 days)  "When was your last BM?"       Not every day- every other day, last BM -Friday 2. STRAINING: "Do you have to strain to have a BM?"      no 3. RECTAL PAIN: "Does your rectum hurt when the stool comes out?" If Yes, ask: "Do you have hemorrhoids? How bad is the pain?"  (Scale 1-10; or mild, moderate, severe)     no 4. STOOL COMPOSITION: "Are the stools hard?"      Nothing out 5. BLOOD ON STOOLS: "Has there been any blood on the toilet tissue or on the surface of the BM?" If Yes, ask: "When was the last time?"     no 6. CHRONIC CONSTIPATION: "Is this a new problem for you?"  If No, ask: "How long have you had this problem?" (days, weeks, months)      No- off/on 7. CHANGES IN DIET OR HYDRATION: "Have there been any recent changes in your diet?" "How much fluids are you drinking on a daily basis?"  "How much have you had to drink today?"     Patient had been eating more last week- good diet, patient does not drink enough water- possible 16-20 oz /day 8. MEDICINES: "Have you been taking any new medicines?" "Are you taking any narcotic pain medicines?" (e.g., Dilaudid, morphine, Percocet, Vicodin)     No changes 9. LAXATIVES: "Have you been using any stool softeners, laxatives, or enemas?"  If Yes, ask "What, how often, and when was the last time?"     Doculax- one dose 10. ACTIVITY:  "How much walking do you do every day?"  "Has your activity level decreased in the past week?"        No- patient  goes short distances 11. CAUSE: "What do you think is causing the constipation?"        unsure 12. OTHER SYMPTOMS: "Do you have any other symptoms?" (e.g., abdomen pain, bloating, fever, vomiting)       Abdominal pain- today 13. MEDICAL HISTORY: "Do you have a history of hemorrhoids, rectal fissures, or rectal surgery or rectal abscess?"         Hx hemorrhoids  Protocols used: Constipation-A-AH

## 2022-04-03 ENCOUNTER — Emergency Department: Payer: HMO

## 2022-04-03 ENCOUNTER — Ambulatory Visit (INDEPENDENT_AMBULATORY_CARE_PROVIDER_SITE_OTHER): Payer: HMO | Admitting: Internal Medicine

## 2022-04-03 ENCOUNTER — Other Ambulatory Visit: Payer: Self-pay

## 2022-04-03 ENCOUNTER — Emergency Department
Admission: EM | Admit: 2022-04-03 | Discharge: 2022-04-03 | Disposition: A | Discharge status: Home / Self Care | Payer: HMO | Attending: Emergency Medicine | Admitting: Emergency Medicine

## 2022-04-03 ENCOUNTER — Encounter: Payer: Self-pay | Admitting: Emergency Medicine

## 2022-04-03 ENCOUNTER — Encounter: Payer: Self-pay | Admitting: Internal Medicine

## 2022-04-03 VITALS — BP 164/80 | HR 137 | Temp 98.5°F | Resp 16 | Ht 60.0 in | Wt 193.0 lb

## 2022-04-03 DIAGNOSIS — I10 Essential (primary) hypertension: Secondary | ICD-10-CM | POA: Diagnosis not present

## 2022-04-03 DIAGNOSIS — R109 Unspecified abdominal pain: Secondary | ICD-10-CM | POA: Insufficient documentation

## 2022-04-03 DIAGNOSIS — K5900 Constipation, unspecified: Secondary | ICD-10-CM

## 2022-04-03 DIAGNOSIS — E119 Type 2 diabetes mellitus without complications: Secondary | ICD-10-CM | POA: Diagnosis not present

## 2022-04-03 DIAGNOSIS — K5641 Fecal impaction: Secondary | ICD-10-CM | POA: Insufficient documentation

## 2022-04-03 DIAGNOSIS — F0390 Unspecified dementia without behavioral disturbance: Secondary | ICD-10-CM | POA: Insufficient documentation

## 2022-04-03 LAB — COMPREHENSIVE METABOLIC PANEL
ALT: 14 U/L (ref 0–44)
AST: 26 U/L (ref 15–41)
Albumin: 3.7 g/dL (ref 3.5–5.0)
Alkaline Phosphatase: 72 U/L (ref 38–126)
Anion gap: 10 (ref 5–15)
BUN: 15 mg/dL (ref 8–23)
CO2: 23 mmol/L (ref 22–32)
Calcium: 9.9 mg/dL (ref 8.9–10.3)
Chloride: 110 mmol/L (ref 98–111)
Creatinine, Ser: 1.66 mg/dL — ABNORMAL HIGH (ref 0.44–1.00)
GFR, Estimated: 33 mL/min — ABNORMAL LOW (ref >60)
Glucose, Bld: 136 mg/dL — ABNORMAL HIGH (ref 70–99)
Potassium: 3.4 mmol/L — ABNORMAL LOW (ref 3.5–5.1)
Sodium: 143 mmol/L (ref 135–145)
Total Bilirubin: 0.7 mg/dL (ref 0.3–1.2)
Total Protein: 7.5 g/dL (ref 6.5–8.1)

## 2022-04-03 LAB — CBC WITH DIFFERENTIAL/PLATELET
Abs Immature Granulocytes: 0.08 10*3/uL — ABNORMAL HIGH (ref 0.00–0.07)
Basophils Absolute: 0.1 10*3/uL (ref 0.0–0.1)
Basophils Relative: 0 %
Eosinophils Absolute: 0 10*3/uL (ref 0.0–0.5)
Eosinophils Relative: 0 %
HCT: 38.9 % (ref 36.0–46.0)
Hemoglobin: 12.4 g/dL (ref 12.0–15.0)
Immature Granulocytes: 1 %
Lymphocytes Relative: 5 %
Lymphs Abs: 0.9 10*3/uL (ref 0.7–4.0)
MCH: 27 pg (ref 26.0–34.0)
MCHC: 31.9 g/dL (ref 30.0–36.0)
MCV: 84.7 fL (ref 80.0–100.0)
Monocytes Absolute: 0.7 10*3/uL (ref 0.1–1.0)
Monocytes Relative: 4 %
Neutro Abs: 15.3 10*3/uL — ABNORMAL HIGH (ref 1.7–7.7)
Neutrophils Relative %: 90 %
Platelets: 442 10*3/uL — ABNORMAL HIGH (ref 150–400)
RBC: 4.59 MIL/uL (ref 3.87–5.11)
RDW: 14.2 % (ref 11.5–15.5)
WBC: 17.1 10*3/uL — ABNORMAL HIGH (ref 4.0–10.5)
nRBC: 0 % (ref 0.0–0.2)

## 2022-04-03 LAB — LIPASE, BLOOD: Lipase: 40 U/L (ref 11–51)

## 2022-04-03 NOTE — ED Provider Notes (Signed)
[facility] Provider Note    Event Date/Time   First MD Initiated Contact with Patient 04/03/22 2001     (approximate)  History   Chief Complaint: Constipation  HPI  Yvonne Curtis is a 71 y.o. female with a past medical history of arthritis, diabetes, hyperlipidemia, hypertension, dementia, presents to the emergency department for constipation and abdominal pain.  According to the husband who is the patient's caregiver patient not had a bowel movement for the past 1 week and has intermittently been complaining of abdominal pain.  Denies any vomiting.  Here the patient appears well, she denies any symptoms but does not know why she is here.  Husband states fairly advanced dementia.  Physical Exam   Triage Vital Signs: ED Triage Vitals  Enc Vitals Group     BP 04/03/22 1601 (!) 147/84     Pulse Rate 04/03/22 1601 (!) 118     Resp 04/03/22 1601 18     Temp 04/03/22 1601 98.8 F (37.1 C)     Temp Source 04/03/22 1601 Oral     SpO2 04/03/22 1601 92 %     Weight 04/03/22 1603 193 lb (87.5 kg)     Height 04/03/22 1603 5' (1.524 m)     Head Circumference --      Peak Flow --      Pain Score 04/03/22 1603 3     Pain Loc --      Pain Edu? --      Excl. in [place]? --     Most recent vital signs: Vitals:   04/03/22 1601  BP: (!) 147/84  Pulse: (!) 118  Resp: 18  Temp: 98.8 F (37.1 C)  SpO2: 92%    General: Awake, no distress.  CV:  Good peripheral perfusion.  Regular rate and rhythm  Resp:  Normal effort.  Equal breath sounds bilaterally.  Abd:  No distention.  Soft, nontender.  No rebound or guarding.  Benign abdomen   ED Results / Procedures / Treatments   RADIOLOGY  I have reviewed and interpreted the CT images.  Patient appears to have a fecal impaction no other significant abnormality seen on my evaluation. Radiology confirms likely fecal impaction no other acute abnormality.   MEDICATIONS ORDERED IN ED: Medications - No data to  display   IMPRESSION / MDM / [place] / ED COURSE  I reviewed the triage vital signs and the nursing notes.  Patient's presentation is most consistent with acute presentation with potential threat to life or bodily function.  Patient presents the emergency department constipation x1 week as well as intermittent abdominal pain.  CT scan appears to show a fecal impaction.  Lab work is reassuring besides moderate leukocytosis, normal chemistry, normal lipase.  I performed a digital disimpaction removed a very significant amount of stool from the patient's rectum.  Patient tolerated fairly well.  Discussed with the husband to use MiraLAX once daily for the next 7 days or until the patient begins having multiple bowel movements and to follow-up with the PCP.  Husband states an issue with constipation discussed using Colace or MiraLAX to help prevent constipation in the future.  Discussed my return precautions for any worsening abdominal pain fever or any other symptoms concerning to his self.  FINAL CLINICAL IMPRESSION(S) / ED DIAGNOSES   Constipation Fecal impaction  Rx / DC Orders   MiraLAX Note:  This document was prepared using Dragon voice recognition software and may include unintentional  dictation errors.   Harvest Dark, MD 04/03/22 2126

## 2022-04-03 NOTE — Patient Instructions (Signed)
It was great seeing you today!  Plan discussed at today's visit: -Recommend she present to the Emergency room for stat imaging, enema and potential disimpaction -Increase oral fluid intake and Miralax 1 capful at least daily   Take care and let us know if you have any questions or concerns prior to your next visit.  Dr. Rosana Berger

## 2022-04-03 NOTE — Progress Notes (Signed)
   Acute Office Visit  Subjective:     Patient ID: Yvonne Curtis, female    DOB: [birth year], 71 y.o.   MRN: [id number]  Chief Complaint  Patient presents with   Constipation    HPI Patient is in today for constipation. She is here with her husband. Patient has dementia and Last BM was 6 days ago. Does have a history of on and off constipation. Does not take anything regularly for constipation. She was given one dose of Miralax and 2 docusates which did not help. No abdominal pain right now, did have some earlier this week. Appetite normal, eating 3 meals, eating most of everything on the plate. Only urinating twice a day, large volume. Not drinking much at all. Husband states that he could see stool in the patient's bottom and she appears to   Review of Systems  Constitutional:  Negative for chills and fever.  Gastrointestinal:  Positive for constipation. Negative for abdominal pain, diarrhea, nausea and vomiting.      Objective:    BP (!) 164/80   Pulse (!) 137   Temp 98.5 F (36.9 C)   Resp 16   Ht 5' (1.524 m)   Wt 193 lb (87.5 kg)   BMI 37.69 kg/m  BP Readings from Last 3 Encounters:  04/03/22 (!) 164/80  02/07/22 (!) 148/86  12/30/21 (!) 150/82   Wt Readings from Last 3 Encounters:  04/03/22 193 lb (87.5 kg)  12/30/21 209 lb (94.8 kg)  12/20/21 202 lb 12.8 oz (92 kg)      Physical Exam Constitutional:      Appearance: Normal appearance.  Eyes:     Conjunctiva/sclera: Conjunctivae normal.  Neurological:     Mental Status: She is alert.  Psychiatric:        Mood and Affect: Mood normal.        Behavior: Behavior normal.     No results found for any visits on 04/03/22.      Assessment & Plan:   1. Constipation, unspecified constipation type: Discussed treatment options for constipation with impaction, I offered to order a stat abdominal x-ray to evaluate the amount of stool present in the colon which the patient's husband did not want to do.  I then  recommended he take the patient to the ER for stat imaging, enema and manual disimpaction.  The patient's husband became very upset by this and states that he does not want to wait "forever" in the ER for her to be treated.  I explained that we do not have enemas here I would not be able to do it for him. I recommend she increase oral hydration and start taking MiraLAX daily but it sounds like she will need an enema to relieve obstruction.  Patient's husband was not happy about this and left with the patient prior to examination.  Return if symptoms worsen or fail to improve.  Teodora Medici, DO

## 2022-04-03 NOTE — ED Triage Notes (Signed)
Patient arrives with spouse c/o constipation. States has not had BM since last Friday. Patient has been eating and drinking with no emesis.

## 2022-04-03 NOTE — ED Provider Triage Note (Signed)
Emergency Medicine Provider Triage Evaluation Note  Yvonne Curtis , a 71 y.o. female  was evaluated in triage.  Pt complains of constipation since Friday. Passing a little bit of gas. No vomiting. Husband gave a laxative. No belly surgery history.  Review of Systems  Positive: constipation Negative: Vomiting  Physical Exam  BP (!) 147/84   Pulse (!) 118   Temp 98.8 F (37.1 C) (Oral)   Resp 18   Ht 5' (1.524 m)   Wt 87.5 kg   SpO2 92%   BMI 37.69 kg/m  Gen:   Awake, no distress   Resp:  Normal effort  MSK:   Moves extremities without difficulty  Other:    Medical Decision Making  Medically screening exam initiated at 4:04 PM.  Appropriate orders placed.  ADRAIN BUTRICK was informed that the remainder of the evaluation will be completed by another provider, this initial triage assessment does not replace that evaluation, and the importance of remaining in the ED until their evaluation is complete.     Marquette Old, PA-C 04/03/22 1607

## 2022-04-03 NOTE — Discharge Instructions (Addendum)
As we discussed please use MiraLAX 1 dose each morning for the next 7 days or until the patient begins having multiple bowel movements.  Please follow-up with your doctor.  To prevent constipation in the future you may use MiraLAX 1 dose every other day or Colace which is over-the-counter as well, every morning as a stool softener.  Return to the emergency department for any fever, any abdominal pain or any other symptoms concerning to yourself.

## 2022-04-07 ENCOUNTER — Telehealth: Payer: Self-pay | Admitting: Gastroenterology

## 2022-04-07 NOTE — Telephone Encounter (Signed)
Patients husband is calling to schedule a hospital follow up. Requests call back to schedule an appointment.

## 2022-04-10 ENCOUNTER — Encounter: Payer: Self-pay | Admitting: Gastroenterology

## 2022-04-10 ENCOUNTER — Ambulatory Visit: Payer: HMO | Admitting: Gastroenterology

## 2022-04-10 VITALS — BP 143/86 | HR 106 | Temp 97.9°F | Wt 193.0 lb

## 2022-04-10 DIAGNOSIS — K5900 Constipation, unspecified: Secondary | ICD-10-CM

## 2022-04-10 NOTE — Progress Notes (Signed)
Primary Care Physician: Steele Sizer, MD  Primary Gastroenterologist:  Dr. Lucilla Lame  Chief Complaint  Patient presents with   Hospitalization Follow-up    HPI: Yvonne Curtis is a 71 y.o. female here for follow-up after being seen in the emergency room for abdominal pain.  The patient was found to have what appeared to be fecal impaction and had some manual disimpaction and the patient was told to take MiraLAX to keep her bowels moving.  The patient has seen me in the past for weight loss and abdominal pain with an upper endoscopy and colonoscopy not showing any sign of a cause for her abdominal pain.  A CT scan back in February of this year and another one done at the beginning of November did not show any other causes for her chronic abdominal pain besides the latter showing fecal impaction. The patient reports that she is been taking MiraLAX every day with now having loose stools with no further constipation.  She also reports that she had significant results from the [facility].  Past Medical History:  Diagnosis Date   Arthritis    knees   Cataract 11/25/2017   Nuclear - [facility]   Diabetes mellitus without complication ([place])    Hyperlipidemia    Hyperopia, bilateral 11/25/2017   [facility] [place]   Hypertension    Obesity    Patient is Sales promotion account executive Witness    declines blood products   Wears dentures    full upper    Current Outpatient Medications  Medication Sig Dispense Refill   amLODipine (NORVASC) 2.5 MG tablet Take 1 tablet (2.5 mg total) by mouth daily. 90 tablet 0   blood glucose meter kit and supplies Dispense based on patient and insurance preference. Use once daily as directed. (FOR ICD-10 E10.9, E11.9). STrips 100 Lancets 100 1 each 0   cetirizine HCl (ZYRTEC) 5 MG/5ML SOLN Take 10 mLs (10 mg total) by mouth daily as needed for allergies. 300 mL 2   divalproex (DEPAKOTE SPRINKLES) 125 MG capsule Take 1 capsule (125 mg total) by mouth 2 (two) times daily. 180  capsule 1   Lancets (ONETOUCH DELICA PLUS [id number]) MISC Use as directed to check blood sugar     mirtazapine (REMERON SOL-TAB) 15 MG disintegrating tablet Take 1 tablet (15 mg total) by mouth at bedtime. 90 tablet 1   ONETOUCH ULTRA test strip 1 each by Other route daily. Use as directed to check blood sugar 100 each 2   rivastigmine (EXELON) 4.6 mg/24hr Place 4.6 mg onto the skin daily.     telmisartan (MICARDIS) 80 MG tablet Take 1 tablet (80 mg total) by mouth daily. 90 tablet 0   No current facility-administered medications for this visit.    Allergies as of 04/10/2022 - Review Complete 04/10/2022  Allergen Reaction Noted   Oxycodone Other (See Comments) 11/17/2014   Oxycodone hcl  11/14/2014    ROS:  General: Negative for anorexia, weight loss, fever, chills, fatigue, weakness. ENT: Negative for hoarseness, difficulty swallowing , nasal congestion. CV: Negative for chest pain, angina, palpitations, dyspnea on exertion, peripheral edema.  Respiratory: Negative for dyspnea at rest, dyspnea on exertion, cough, sputum, wheezing.  GI: See history of present illness. GU:  Negative for dysuria, hematuria, urinary incontinence, urinary frequency, nocturnal urination.  Endo: Negative for unusual weight change.    Physical Examination:   BP (!) 160/90 (BP Location: Left Arm, Patient Position: Sitting, Cuff Size: Large)   Pulse (!) 114   Temp  97.9 F (36.6 C) (Oral)   Wt 193 lb (87.5 kg)   BMI 37.69 kg/m   General: Well-nourished, well-developed in no acute distress.  Eyes: No icterus. Conjunctivae pink. Neuro: Alert and oriented x 3.  Grossly intact. Skin: Warm and dry, no jaundice.   Psych: Alert and cooperative, normal mood and affect.  Labs:    Imaging Studies: CT ABDOMEN PELVIS WO CONTRAST  Result Date: 04/03/2022 CLINICAL DATA:  Constipation. EXAM: CT ABDOMEN AND PELVIS WITHOUT CONTRAST TECHNIQUE: Multidetector CT imaging of the abdomen and pelvis was performed  following the standard protocol without IV contrast. RADIATION DOSE REDUCTION: This exam was performed according to the departmental dose-optimization program which includes automated exposure control, adjustment of the mA and/or kV according to patient size and/or use of iterative reconstruction technique. COMPARISON:  CT abdomen pelvis dated July 19, 2021. FINDINGS: Lower chest: No acute abnormality. Hepatobiliary: No focal liver abnormality is seen. Status post cholecystectomy. No biliary dilatation. Pancreas: Unremarkable. No pancreatic ductal dilatation or surrounding inflammatory changes. Spleen: Normal in size without focal abnormality. Adrenals/Urinary Tract: Adrenal glands are unremarkable. Kidneys are normal, without renal calculi, focal lesion, or hydronephrosis. Bladder is unremarkable. Stomach/Bowel: Moderate stool ball within the rectum. Remaining colonic stool burden is within normal limits. Mild diffuse colonic diverticulosis. Normal appendix. The stomach and small bowel are unremarkable. No obstruction. Vascular/Lymphatic: Aortic atherosclerosis. No enlarged abdominal or pelvic lymph nodes. Reproductive: Unchanged fibroid uterus.  No adnexal mass. Other: No abdominal wall hernia or abnormality. No abdominopelvic ascites. No pneumoperitoneum. Musculoskeletal: No acute or significant osseous findings. IMPRESSION: 1. Moderate stool ball within the rectum. Correlate for fecal impaction. 2. No bowel obstruction. 3.  Aortic Atherosclerosis (ICD10-I70.0). Electronically Signed   By: Titus Dubin M.D.   On: 04/03/2022 20:34    Assessment and Plan:   Yvonne Curtis is a 72 y.o. y/o female who comes in today with a history of constipation with a visit to the ER with stool impaction.  The patient has been doing well on MiraLAX except for some loose stools.  The patient and her husband have been told to titrate the MiraLAX so that she is having a formed bowel movement every day.  The patient will  contact me if she has any further problems.  The patient is not due for another colonoscopy for another 3 years.  Patient and her husband have been explained the plan and agree with it.     Lucilla Lame, MD. [facility]    Note: This dictation was prepared with Dragon dictation along with smaller phrase technology. Any transcriptional errors that result from this process are unintentional.

## 2022-04-14 ENCOUNTER — Other Ambulatory Visit: Payer: Self-pay | Admitting: Family Medicine

## 2022-04-14 DIAGNOSIS — Z1231 Encounter for screening mammogram for malignant neoplasm of breast: Secondary | ICD-10-CM

## 2022-04-22 ENCOUNTER — Encounter: Payer: Self-pay | Admitting: Physician Assistant

## 2022-04-22 ENCOUNTER — Ambulatory Visit (INDEPENDENT_AMBULATORY_CARE_PROVIDER_SITE_OTHER): Payer: HMO | Admitting: Physician Assistant

## 2022-04-22 VITALS — BP 144/78 | HR 96 | Temp 98.2°F | Resp 16 | Ht 60.0 in | Wt 192.2 lb

## 2022-04-22 DIAGNOSIS — Z7409 Other reduced mobility: Secondary | ICD-10-CM | POA: Insufficient documentation

## 2022-04-22 DIAGNOSIS — Z0000 Encounter for general adult medical examination without abnormal findings: Secondary | ICD-10-CM

## 2022-04-22 NOTE — Assessment & Plan Note (Signed)
Chronic, likely progressing secondary to Alzheimer's diagnosis Husband is requesting rolling walker to assist with ambulation I will also provide referral to PT to assist with walking and balance with goal of improving her ability to get around and use the walker appropriately Recommend follow up in about 3 months to assess progress

## 2022-04-22 NOTE — Patient Instructions (Addendum)
Yvonne Curtis , Thank you for taking time to come for your Medicare Wellness Visit. I appreciate your ongoing commitment to your health goals. Please review the following plan we discussed and let me know if I can assist you in the future.   Screening recommendations/referrals: Colonoscopy: Completed in 2021, next due in 2026 Mammogram: scheduled for 05/23/2022 Bone Density: Completed in 2022- you can repeat this every 2 years for monitoring Recommended yearly ophthalmology/optometry visit for glaucoma screening and checkup Recommended yearly dental visit for hygiene and checkup  Vaccinations: Influenza vaccine: Up to date  Pneumococcal vaccine: Completed vaccine course  Tdap vaccine: Not up to date- please speak with your PCP about getting this updated  Shingles vaccine: completed vaccine course    Covid-19:Please stay up to date on the recommendations for those in your age group concerning vaccine and booster recommendations   Advanced directives: on file   Conditions/risks identified: Dementia and ambulation trouble You are already seeing Neurology for your memory trouble and I have placed an order to Physical therapy to help with your walking   Next appointment: Follow up in one year for your annual wellness visit    Preventive Care 65 Years and Older, Female Preventive care refers to lifestyle choices and visits with your health care provider that can promote health and wellness. What does preventive care include? A yearly physical exam. This is also called an annual well check. Dental exams once or twice a year. Routine eye exams. Ask your health care provider how often you should have your eyes checked. Personal lifestyle choices, including: Daily care of your teeth and gums. Regular physical activity. Eating a healthy diet. Avoiding tobacco and drug use. Limiting alcohol use. Practicing safe sex. Taking low-dose aspirin every day. Taking vitamin and mineral supplements as  recommended by your health care provider. What happens during an annual well check? The services and screenings done by your health care provider during your annual well check will depend on your age, overall health, lifestyle risk factors, and family history of disease. Counseling  Your health care provider may ask you questions about your: Alcohol use. Tobacco use. Drug use. Emotional well-being. Home and relationship well-being. Sexual activity. Eating habits. History of falls. Memory and ability to understand (cognition). Work and work Statistician. Reproductive health. Screening  You may have the following tests or measurements: Height, weight, and BMI. Blood pressure. Lipid and cholesterol levels. These may be checked every 5 years, or more frequently if you are over 67 years old. Skin check. Lung cancer screening. You may have this screening every year starting at age 26 if you have a 30-pack-year history of smoking and currently smoke or have quit within the past 15 years. Fecal occult blood test (FOBT) of the stool. You may have this test every year starting at age 59. Flexible sigmoidoscopy or colonoscopy. You may have a sigmoidoscopy every 5 years or a colonoscopy every 10 years starting at age 24. Hepatitis C blood test. Hepatitis B blood test. Sexually transmitted disease (STD) testing. Diabetes screening. This is done by checking your blood sugar (glucose) after you have not eaten for a while (fasting). You may have this done every 1-3 years. Bone density scan. This is done to screen for osteoporosis. You may have this done starting at age 57. Mammogram. This may be done every 1-2 years. Talk to your health care provider about how often you should have regular mammograms. Talk with your health care provider about your test results, treatment  options, and if necessary, the need for more tests. Vaccines  Your health care provider may recommend certain vaccines, such  as: Influenza vaccine. This is recommended every year. Tetanus, diphtheria, and acellular pertussis (Tdap, Td) vaccine. You may need a Td booster every 10 years. Zoster vaccine. You may need this after age 89. Pneumococcal 13-valent conjugate (PCV13) vaccine. One dose is recommended after age 74. Pneumococcal polysaccharide (PPSV23) vaccine. One dose is recommended after age 50. Talk to your health care provider about which screenings and vaccines you need and how often you need them. This information is not intended to replace advice given to you by your health care provider. Make sure you discuss any questions you have with your health care provider. Document Released: 05/18/2015 Document Revised: 01/09/2016 Document Reviewed: 02/20/2015 Elsevier Interactive Patient Education  2017 [place] Prevention in the Home Falls can cause injuries. They can happen to people of all ages. There are many things you can do to make your home safe and to help prevent falls. What can I do on the outside of my home? Regularly fix the edges of walkways and driveways and fix any cracks. Remove anything that might make you trip as you walk through a door, such as a raised step or threshold. Trim any bushes or trees on the path to your home. Use bright outdoor lighting. Clear any walking paths of anything that might make someone trip, such as rocks or tools. Regularly check to see if handrails are loose or broken. Make sure that both sides of any steps have handrails. Any raised decks and porches should have guardrails on the edges. Have any leaves, snow, or ice cleared regularly. Use sand or salt on walking paths during winter. Clean up any spills in your garage right away. This includes oil or grease spills. What can I do in the bathroom? Use night lights. Install grab bars by the toilet and in the tub and shower. Do not use towel bars as grab bars. Use non-skid mats or decals in the tub or  shower. If you need to sit down in the shower, use a plastic, non-slip stool. Keep the floor dry. Clean up any water that spills on the floor as soon as it happens. Remove soap buildup in the tub or shower regularly. Attach bath mats securely with double-sided non-slip rug tape. Do not have throw rugs and other things on the floor that can make you trip. What can I do in the bedroom? Use night lights. Make sure that you have a light by your bed that is easy to reach. Do not use any sheets or blankets that are too big for your bed. They should not hang down onto the floor. Have a firm chair that has side arms. You can use this for support while you get dressed. Do not have throw rugs and other things on the floor that can make you trip. What can I do in the kitchen? Clean up any spills right away. Avoid walking on wet floors. Keep items that you use a lot in easy-to-reach places. If you need to reach something above you, use a strong step stool that has a grab bar. Keep electrical cords out of the way. Do not use floor polish or wax that makes floors slippery. If you must use wax, use non-skid floor wax. Do not have throw rugs and other things on the floor that can make you trip. What can I do with my stairs? Do not  leave any items on the stairs. Make sure that there are handrails on both sides of the stairs and use them. Fix handrails that are broken or loose. Make sure that handrails are as long as the stairways. Check any carpeting to make sure that it is firmly attached to the stairs. Fix any carpet that is loose or worn. Avoid having throw rugs at the top or bottom of the stairs. If you do have throw rugs, attach them to the floor with carpet tape. Make sure that you have a light switch at the top of the stairs and the bottom of the stairs. If you do not have them, ask someone to add them for you. What else can I do to help prevent falls? Wear shoes that: Do not have high heels. Have  rubber bottoms. Are comfortable and fit you well. Are closed at the toe. Do not wear sandals. If you use a stepladder: Make sure that it is fully opened. Do not climb a closed stepladder. Make sure that both sides of the stepladder are locked into place. Ask someone to hold it for you, if possible. Clearly mark and make sure that you can see: Any grab bars or handrails. First and last steps. Where the edge of each step is. Use tools that help you move around (mobility aids) if they are needed. These include: Canes. Walkers. Scooters. Crutches. Turn on the lights when you go into a dark area. Replace any light bulbs as soon as they burn out. Set up your furniture so you have a clear path. Avoid moving your furniture around. If any of your floors are uneven, fix them. If there are any pets around you, be aware of where they are. Review your medicines with your doctor. Some medicines can make you feel dizzy. This can increase your chance of falling. Ask your doctor what other things that you can do to help prevent falls. This information is not intended to replace advice given to you by your health care provider. Make sure you discuss any questions you have with your health care provider. Document Released: 02/15/2009 Document Revised: 09/27/2015 Document Reviewed: 05/26/2014 Elsevier Interactive Patient Education  2017 [employer].

## 2022-04-22 NOTE — Progress Notes (Signed)
Subjective:   Yvonne Curtis is a 71 y.o. female who presents for Medicare Annual (Subsequent) preventive examination. Today's Provider: Talitha Givens, MHS, PA-C Introduced myself to the patient as a PA-C and provided education on APPs in clinical practice.    Patient is here with her husband to assist with visit and answer questions  Husband reports he would like to get a rolling walker for her to use to help with walking  He denies recent falls or stumbles    Review of Systems:   Cardiac Risk Factors include: advanced age (>38mn, >>55women);diabetes mellitus;obesity (BMI >30kg/m2)     Objective:     Vitals: BP (!) 144/78   Pulse 96   Temp 98.2 F (36.8 C) (Oral)   Resp 16   Ht 5' (1.524 m)   Wt 192 lb 3.2 oz (87.2 kg)   SpO2 98%   BMI 37.54 kg/m   Body mass index is 37.54 kg/m.     04/03/2022    4:04 PM 07/19/2021    7:37 AM 04/18/2021    8:35 AM 08/23/2020    9:11 AM 04/17/2020    8:32 AM 01/17/2020    8:41 AM 03/15/2019    8:20 AM  Advanced Directives  Does Patient Have a Medical Advance Directive? No No Yes No Yes Yes Yes  Type of AComptrollerLiving will  HKennes[place]Living will  HSt. JohnLiving will  Copy of [place]in Chart?   Yes - validated most recent copy scanned in chart (See row information)  Yes - validated most recent copy scanned in chart (See row information)  Yes - validated most recent copy scanned in chart (See row information)  Would patient like information on creating a medical advance directive?    No - Patient declined       Tobacco Social History   Tobacco Use  Smoking Status Never  Smokeless Tobacco Never     Counseling given: Not Answered   Clinical Intake:  Pre-visit preparation completed: Yes  Pain : No/denies pain     Nutritional Status: BMI > 30  Obese Nutritional Risks: None Diabetes: Yes CBG done?: No Did pt. bring in  CBG monitor from home?: No  How often do you need to have someone help you when you read instructions, pamphlets, or other written materials from your doctor or pharmacy?: 5 - Always What is the last grade level you completed in school?: 12th grade  Interpreter Needed?: No     Past Medical History:  Diagnosis Date   Arthritis    knees   Cataract 11/25/2017   Nuclear - OU   Diabetes mellitus without complication ([place]    Hyperlipidemia    Hyperopia, bilateral 11/25/2017   [place]   Hypertension    Obesity    Patient is Jehovah's Witness    declines blood products   Wears dentures    full upper   Past Surgical History:  Procedure Laterality Date   CHOLECYSTECTOMY     COLONOSCOPY WITH PROPOFOL N/A 11/30/2014   Procedure: COLONOSCOPY WITH PROPOFOL;  Surgeon: DLucilla Lame MD;  [place]  Service: [place];  Laterality: N/A;  WITH BIOPSY-- TRANSVERSE COLON POLYP  X 2 DESCENING COLON POLYP   COLONOSCOPY WITH PROPOFOL N/A 01/17/2020   Procedure: COLONOSCOPY WITH PROPOFOL;  Surgeon: WLucilla Lame MD;  [facility]  Service: Endoscopy;  Laterality: N/A;   ESOPHAGOGASTRODUODENOSCOPY (EGD)  WITH PROPOFOL N/A 08/23/2020   Procedure: ESOPHAGOGASTRODUODENOSCOPY (EGD) WITH PROPOFOL;  Surgeon: Lucilla Lame, MD;  [facility];  Service: Endoscopy;  Laterality: N/A;   GALLBLADDER SURGERY     SEPTOPLASTY     tonsillectomy     TONSILLECTOMY     TUBAL LIGATION     Family History  Problem Relation Age of Onset   Colon cancer Mother    Lung cancer Father    Hypertension Sister    Hypertension Sister    Breast cancer Neg Hx    Social History   Socioeconomic History   Marital status: Married    Spouse name: [name]    Number of children: 2   Years of education: Not on file   Highest education level: 12th grade  Occupational History   Occupation: retired  Tobacco Use   Smoking status: Never   Smokeless tobacco: Never  Vaping Use    Vaping Use: Never used  Substance and Sexual Activity   Alcohol use: No    Alcohol/week: 0.0 standard drinks of alcohol   Drug use: No   Sexual activity: Yes    Partners: Male  Other Topics Concern   Not on file  Social History Narrative   Retired from [employer] , custodian   Husband also retired, but is back to work.    She dances for physical activity   She has two grandchildren, one is in college   Social Determinants of Health   Financial Resource Strain: Low Risk  (04/18/2021)   Overall Financial Resource Strain (CARDIA)    Difficulty of Paying Living Expenses: Not hard at all  Food Insecurity: No Food Insecurity (04/18/2021)   Hunger Vital Sign    Worried About Running Out of Food in the Last Year: Never true    Ran Out of Food in the Last Year: Never true  Transportation Needs: No Transportation Needs (04/18/2021)   PRAPARE - Hydrologist (Medical): No    Lack of Transportation (Non-Medical): No  Physical Activity: Inactive (04/18/2021)   Exercise Vital Sign    Days of Exercise per Week: 0 days    Minutes of Exercise per Session: 0 min  Stress: Stress Concern Present (04/18/2021)   [place]    Feeling of Stress : To some extent  Social Connections: Socially Integrated (04/18/2021)   Social Connection and Isolation Panel [NHANES]    Frequency of Communication with Friends and Family: More than three times a week    Frequency of Social Gatherings with Friends and Family: More than three times a week    Attends Religious Services: More than 4 times per year    Active Member of [employer] or Organizations: Yes    Attends Archivist Meetings: More than 4 times per year    Marital Status: Married    Outpatient Encounter Medications as of 04/22/2022  Medication Sig   amLODipine (NORVASC) 2.5 MG tablet Take 1 tablet (2.5 mg total) by mouth daily.   blood glucose  meter kit and supplies Dispense based on patient and insurance preference. Use once daily as directed. (FOR ICD-10 E10.9, E11.9). STrips 100 Lancets 100   cetirizine HCl (ZYRTEC) 5 MG/5ML SOLN Take 10 mLs (10 mg total) by mouth daily as needed for allergies.   divalproex (DEPAKOTE SPRINKLES) 125 MG capsule Take 1 capsule (125 mg total) by mouth 2 (two) times daily.   Lancets (ONETOUCH DELICA PLUS [id number]) [place]  Use as directed to check blood sugar   mirtazapine (REMERON SOL-TAB) 15 MG disintegrating tablet Take 1 tablet (15 mg total) by mouth at bedtime.   ONETOUCH ULTRA test strip 1 each by Other route daily. Use as directed to check blood sugar   rivastigmine (EXELON) 4.6 mg/24hr Place 4.6 mg onto the skin daily.   telmisartan (MICARDIS) 80 MG tablet Take 1 tablet (80 mg total) by mouth daily.   No facility-administered encounter medications on file as of 04/22/2022.    Activities of Daily Living    04/22/2022    8:39 AM 04/22/2022    8:18 AM  In your present state of health, do you have any difficulty performing the following activities:  Hearing? 0 0  Vision? 0 0  Difficulty concentrating or making decisions? 1 1  Walking or climbing stairs? 1 1  Dressing or bathing? 1 1  Doing errands, shopping? 1 1  Preparing Food and eating ? Y   Using the Toilet? Y   In the past six months, have you accidently leaked urine? Y   Do you have problems with loss of bowel control? Y   Managing your Medications? Y   Managing your Finances? Y   Housekeeping or managing your Housekeeping? Y     Patient Care Team: Steele Sizer, MD as PCP - General (Family Medicine) Eulogio Bear, MD as Consulting Physician (Ophthalmology) Anabel Bene, MD as Referring Physician (Neurology) Lyla Son, MD as Consulting Physician (Nephrology) Lonia Farber, MD as Consulting Physician (Internal Medicine)    Assessment:   This is a routine wellness examination for [place].  Exercise  Activities and Dietary recommendations Current Exercise Habits: The patient does not participate in regular exercise at present, Exercise limited by: psychological condition(s)   Goals Addressed   None     Fall Risk:    04/22/2022    8:18 AM 04/03/2022    2:38 PM 12/30/2021   11:03 AM 12/20/2021    9:25 AM 10/24/2021   10:59 AM  Fall Risk   Falls in the past year? _0 0  Number falls in past yr: 0 0 1 1 0  Injury with Fall? 0 0 1 0 0  Risk for fall due to : Impaired balance/gait  Impaired balance/gait;Impaired mobility History of fall(s);Impaired balance/gait;Mental status change Impaired balance/gait  Follow up Falls prevention discussed;Education provided;Falls evaluation completed  Falls prevention discussed;Education provided Falls prevention discussed;Education provided;Falls evaluation completed Falls prevention discussed    FALL RISK PREVENTION PERTAINING TO THE HOME:  Any stairs in or around the home? Yes  If so, are there any without handrails? Yes   Home free of loose throw rugs in walkways, pet beds, electrical cords, etc? Yes  Adequate lighting in your home to reduce risk of falls? Yes   ASSISTIVE DEVICES UTILIZED TO PREVENT FALLS:  Life alert? Yes  Use of a cane, walker or w/c? Yes  Grab bars in the bathroom? No  Shower chair or bench in shower? Yes  Elevated toilet seat or a handicapped toilet? Yes   DME ORDERS:  DME order needed?  Yes - rolling walker order placed   TIMED UP AND GO:  Was the test performed? Yes .  Length of time to ambulate 10 feet: 20 sec.   GAIT:  Appearance of gait: Short steps with mild swaying. Standing from sitting position requires assistance as does sitting down into a chair.  Education: Fall risk prevention has been discussed.  Intervention(s)  required? Yes   DME/home health order needed?  Yes - husband requests rolling walker to assist with ambulation. Gait reviewed during visit and this seems appropriate to assist with  walking and balance     Depression Screen    04/22/2022    8:18 AM 04/03/2022    2:46 PM 12/30/2021   11:03 AM 12/20/2021    9:25 AM  PHQ 2/9 Scores  PHQ - 2 Score 6 0 0 0  PHQ- 9 Score 12 0 0 0     Cognitive Function    08/09/2019    2:24 PM  MMSE - Mini Mental State Exam  Orientation to time 3  Orientation to Place 5  Registration 3  Attention/ Calculation 0  Recall 3  Language- name 2 objects 2  Language- repeat 1  Language- follow 3 step command 3  Language- read & follow direction 1  Write a sentence 1  Copy design 0  Total score 22        04/22/2022    8:41 AM 03/15/2019    8:23 AM 03/09/2018    8:51 AM  6CIT Screen  What Year? 4 points 0 points 0 points  What month? 3 points 3 points 0 points  What time? 3 points 0 points 0 points  Count back from 20 4 points 0 points 0 points  Months in reverse 4 points 4 points 0 points  Repeat phrase 10 points 10 points 0 points  Total Score 28 points 17 points 0 points    Immunization History  Administered Date(s) Administered   Influenza Nasal 02/12/2018   Influenza Split 02/23/2008   Influenza,inj,Quad PF,6+ Mos 02/09/2019, 01/23/2021   Influenza-Unspecified 01/25/2014, 01/23/2015, 01/30/2016, 02/06/2017, 01/28/2018, 01/18/2020   Moderna Sars-Covid-2 Vaccination 06/17/2019, 07/18/2019, 03/06/2020, 08/06/2020   Pneumococcal Conjugate-13 01/17/2015   Pneumococcal Polysaccharide-23 06/26/2010, 02/20/2016   Tdap 06/26/2010   Zoster Recombinat (Shingrix) 09/14/2019, 11/16/2019   Zoster, Live 03/06/2011    Qualifies for Shingles Vaccine? No, Zoster completed   Tdap: Although this vaccine is not a covered service during a Wellness Exam, does the patient still wish to receive this vaccine today?  Yes .  Education has been provided regarding the importance of this vaccine. Advised may receive this vaccine at local pharmacy or Health Dept. Aware to provide a copy of the vaccination record if obtained from local  pharmacy or Health Dept. Verbalized acceptance and understanding.  Flu Vaccine:    Completed at [facility] .   Pneumococcal Vaccine: Completed 02/20/2016   Covid-19 Vaccine: Completed vaccines  Screening Tests Health Maintenance  Topic Date Due   DTaP/Tdap/Td (2 - Td or Tdap) 06/26/2020   Diabetic kidney evaluation - Urine ACR  08/08/2020   COVID-19 Vaccine (5 - 2023-24 season) 05/08/2022 (Originally 01/03/2022)   INFLUENZA VACCINE  08/03/2022 (Originally 12/03/2021)   HEMOGLOBIN A1C  06/22/2022   FOOT EXAM  08/21/2022   OPHTHALMOLOGY EXAM  12/13/2022   Diabetic kidney evaluation - eGFR measurement  04/04/2023   MAMMOGRAM  05/22/2023   COLONOSCOPY (Pts 45-21yr Insurance coverage will need to be confirmed)  01/17/2025   Pneumonia Vaccine 72 Years old  Completed   DEXA SCAN  Completed   Hepatitis C Screening  Completed   Zoster Vaccines- Shingrix  Completed   HPV VACCINES  Aged Out    Cancer Screenings:  Colorectal Screening: Completed 01/18/2020. Repeat every 5 years  Mammogram: Scheduled 05/22/2022  Bone Density: Completed 02/11/2021. Results reflect NORMAL,  Lung Cancer Screening: (Low Dose CT Chest recommended  if Age 19-80 years, 93 pack-year currently smoking OR have quit w/in 15years.) does not qualify.     Additional Screening:  Hepatitis C Screening: does qualify; Completed 01/30/2012  Vision Screening: Recommended annual ophthalmology exams for early detection of glaucoma and other disorders of the eye. Is the patient up to date with their annual eye exam?  Yes  Who is the provider or what is the name of the office in which the pt attends annual eye exams? [facility]   Dental Screening: Recommended annual dental exams for proper oral hygiene  Community Resource Referral:  CRR required this visit?  No       Plan:  I have personally reviewed and addressed the Medicare Annual Wellness questionnaire and have noted the following in the patient's chart:   A. Medical and social history B. Use of alcohol, tobacco or illicit drugs  C. Current medications and supplements D. Functional ability and status E.  Nutritional status F.  Physical activity G. Advance directives H. List of other physicians I.  Hospitalizations, surgeries, and ER visits in previous 12 months J.  [place] such as hearing and vision if needed, cognitive and depression L. Referrals and appointments   In addition, I have reviewed and discussed with patient certain preventive protocols, quality metrics, and best practice recommendations. A written personalized care plan for preventive services as well as general preventive health recommendations were provided to patient.  Signed,   Talitha Givens, MHS, PA-C [facility]

## 2022-04-27 ENCOUNTER — Other Ambulatory Visit: Payer: Self-pay | Admitting: Family Medicine

## 2022-05-07 DIAGNOSIS — N281 Cyst of kidney, acquired: Secondary | ICD-10-CM | POA: Diagnosis not present

## 2022-05-07 DIAGNOSIS — I10 Essential (primary) hypertension: Secondary | ICD-10-CM | POA: Diagnosis not present

## 2022-05-07 DIAGNOSIS — E1122 Type 2 diabetes mellitus with diabetic chronic kidney disease: Secondary | ICD-10-CM | POA: Diagnosis not present

## 2022-05-07 DIAGNOSIS — N1832 Chronic kidney disease, stage 3b: Secondary | ICD-10-CM | POA: Diagnosis not present

## 2022-05-07 DIAGNOSIS — F03911 Unspecified dementia, unspecified severity, with agitation: Secondary | ICD-10-CM | POA: Diagnosis not present

## 2022-05-07 DIAGNOSIS — E785 Hyperlipidemia, unspecified: Secondary | ICD-10-CM | POA: Diagnosis not present

## 2022-05-07 DIAGNOSIS — R809 Proteinuria, unspecified: Secondary | ICD-10-CM | POA: Diagnosis not present

## 2022-05-07 DIAGNOSIS — D631 Anemia in chronic kidney disease: Secondary | ICD-10-CM | POA: Diagnosis not present

## 2022-05-07 DIAGNOSIS — E7800 Pure hypercholesterolemia, unspecified: Secondary | ICD-10-CM | POA: Diagnosis not present

## 2022-05-15 ENCOUNTER — Other Ambulatory Visit: Payer: Self-pay | Admitting: Physician Assistant

## 2022-05-15 ENCOUNTER — Ambulatory Visit: Payer: HMO | Attending: Physician Assistant

## 2022-05-15 DIAGNOSIS — Z7409 Other reduced mobility: Secondary | ICD-10-CM

## 2022-05-15 DIAGNOSIS — G301 Alzheimer's disease with late onset: Secondary | ICD-10-CM

## 2022-05-15 DIAGNOSIS — R262 Difficulty in walking, not elsewhere classified: Secondary | ICD-10-CM | POA: Diagnosis not present

## 2022-05-15 DIAGNOSIS — M6281 Muscle weakness (generalized): Secondary | ICD-10-CM | POA: Insufficient documentation

## 2022-05-15 NOTE — Therapy (Signed)
OUTPATIENT PHYSICAL THERAPY NEURO EVALUATION   Patient Name: Yvonne Curtis MRN: [id number] DOB:21-Apr-1951, 72 y.o., female Today's Date: 05/15/2022   PCP: Steele Sizer, MD  REFERRING PROVIDER: Almon Register, PA-C   END OF SESSION:  PT End of Session - 05/15/22 1311     Visit Number 1    Number of Visits 1    PT Start Time 3662    PT Stop Time 9476    PT Time Calculation (min) 40 min             Past Medical History:  Diagnosis Date   Arthritis    knees   Cataract 11/25/2017   Nuclear - OU   Diabetes mellitus without complication ([place])    Hyperlipidemia    Hyperopia, bilateral 11/25/2017   [place]. [place]   Hypertension    Obesity    Patient is Jehovah's Witness    declines blood products   Wears dentures    full upper   Past Surgical History:  Procedure Laterality Date   CHOLECYSTECTOMY     COLONOSCOPY WITH PROPOFOL N/A 11/30/2014   Procedure: COLONOSCOPY WITH PROPOFOL;  Surgeon: Lucilla Lame, MD;  Location: [place];  Service: Endoscopy;  Laterality: N/A;  WITH BIOPSY-- TRANSVERSE COLON POLYP  X 2 DESCENING COLON POLYP   COLONOSCOPY WITH PROPOFOL N/A 01/17/2020   Procedure: COLONOSCOPY WITH PROPOFOL;  Surgeon: Lucilla Lame, MD;  [facility] END[facility]COPY;  Service: Endoscopy;  Laterality: N/A;   ESOPHAGOGASTRODUODENOSCOPY (EGD) WITH PROPOFOL N/A 08/23/2020   Procedure: ESOPHAGOGASTRODUODENOSCOPY (EGD) WITH PROPOFOL;  Surgeon: Lucilla Lame, MD;  Location[facility];  Service: Endoscopy;  Laterality: N/A;   GALLBLADDER SURGERY     SEPTOPLASTY     tonsillectomy     TONSILLECTOMY     TUBAL LIGATION     Patient Active Problem List   Diagnosis Date Noted   Impaired functional mobility, balance, gait, and endurance 04/22/2022   Moderate late onset Alzheimer's dementia with mood disturbance ([place]) 12/20/2021   Seasonal allergic rhinitis 12/20/2021   Moderate protein-calorie malnutrition ([place]) 08/20/2021   Moderate episode of recurrent major  depressive disorder ([place]) 08/20/2021   Cyst of kidney, acquired 07/04/2021   Anemia in chronic kidney disease 02/07/2021   Pure hypercholesterolemia, unspecified 02/07/2021   Hypercalcemia 10/11/2020   Chronic kidney disease, stage 3b ([place]) 09/04/2020   Dysphagia    History of colonic polyps    Polyp of transverse colon    Abnormal sense of taste 12/19/2019   Loss of memory 12/19/2019   Mood changes 12/19/2019   Acanthosis nigricans 11/14/2014   Benign hypertension 11/14/2014   Dyslipidemia 11/14/2014   Gastro-esophageal reflux disease without esophagitis 11/14/2014   Morbid obesity ([place]) 11/14/2014   Primary osteoarthritis of both knees 11/14/2014   Microalbuminuria 10/15/2012   Controlled type 2 diabetes mellitus with stage 3 chronic kidney disease, without long-term current use of insulin ([place]) 06/05/2009   Body dermatophytosis 12/13/2008    ONSET DATE: 8 months ago.  REFERRING DIAG: Z74.09 (ICD-10-CM) - Impaired functional mobility, balance, gait, and endurance   THERAPY DIAG:  Difficulty in walking, not elsewhere classified  Muscle weakness (generalized)  Rationale for Evaluation and Treatment: Rehabilitation  SUBJECTIVE:  SUBJECTIVE STATEMENT:  Pt is accompanied by her husband who provides majority of the information for the evaluation.  Husband states that the pt is unable to effectively communicate due to dementia at this time.  Pt has been having difficulty navigating the home lately and is hoping that therapy would assist with getting her more mobile and stronger.   Pt accompanied by: significant other  PERTINENT HISTORY:  MEKO BELLANGER is a 72 y.o. female with a past medical history of arthritis, diabetes, hyperlipidemia, hypertension, dementia, presents to the emergency  department for constipation and abdominal pain.   PAIN:  Are you having pain? No  PRECAUTIONS: None  WEIGHT BEARING RESTRICTIONS: No  FALLS: Has patient fallen in last 6 months? Yes. Number of falls 1  LIVING ENVIRONMENT: Lives with: lives with their spouse Lives in: House/apartment Stairs: Yes: External: 2 steps; on left going up Has following equipment at home: Single point cane, Walker - 4 wheeled, shower chair, and bed side commode  PLOF: Needs assistance with ADLs, Needs assistance with homemaking, Needs assistance with gait, and Needs assistance with transfers  PATIENT GOALS: Get better and stronger  OBJECTIVE:   DIAGNOSTIC FINDINGS: N/A  COGNITION: Overall cognitive status: History of cognitive impairments - at baseline   SENSATION: WFL  POSTURE: rounded shoulders and forward head  LOWER EXTREMITY MMT:    MMT Right Eval Left Eval  Hip flexion    Hip extension    Hip abduction    Hip adduction    Hip internal rotation    Hip external rotation    Knee flexion    Knee extension    Ankle dorsiflexion    Ankle plantarflexion    Ankle inversion    Ankle eversion    (Blank rows = not tested)   TRANSFERS: Assistive device utilized: Environmental consultant - 4 wheeled  Sit to stand: CGA Stand to sit: Min A  GAIT: Gait pattern: step to pattern, decreased stride length, shuffling, trunk flexed, poor foot clearance- Right, and poor foot clearance- Left Distance walked: 20' Assistive device utilized: Environmental consultant - 4 wheeled Level of assistance: CGA Comments: Pt unstable at times likely due to mentation more than anything.  Pt takes short strides and    TODAY'S TREATMENT:                                                                                                                              DATE: 05/15/22   Eval only - referring to [place]: Education details: Pt educated on role of PT and services provided during current POC, along with prognosis and  information about the clinic. Person educated: Patient and Spouse Education method: Explanation Education comprehension: verbalized understanding  HOME EXERCISE PROGRAM: Not given at this time.  GOALS: Goals reviewed with patient? Yes  SHORT TERM GOALS: Target date: 05/15/22  Pt and spouse educated on role of PT and referral to HHPT that is more appropriate for  pt at this time. Goal status: INITIAL  ASSESSMENT:  CLINICAL IMPRESSION: Patient is a 72 y.o. female who was seen today for physical therapy evaluation and treatment for impaired functional mobility, balance, gait and endurance.  Pt exhibits significant weakness in the LE's and has difficulty with transfers and ambulation.  Pt does present with cognitive deficits that makes her much more appropriate for home health therapy and potentially needing an aide at this time.  MD notified via secure chat and agreeable to send new referral for HHPT at this time.  Pt is being seen at clinic for eval only at this time.   OBJECTIVE IMPAIRMENTS: Abnormal gait, decreased activity tolerance, decreased balance, decreased cognition, decreased endurance, decreased knowledge of use of DME, decreased mobility, difficulty walking, decreased ROM, decreased strength, decreased safety awareness, and obesity.   ACTIVITY LIMITATIONS: carrying, bending, sitting, standing, squatting, stairs, transfers, bed mobility, bathing, toileting, dressing, hygiene/grooming, and locomotion level  PARTICIPATION LIMITATIONS: meal prep, cleaning, laundry, medication management, personal finances, interpersonal relationship, driving, shopping, community activity, and yard work  PERSONAL FACTORS: Age, Behavior pattern, Fitness, Past/current experiences, Time since onset of injury/illness/exacerbation, Transportation, and 3+ comorbidities: HTN, arthritis, DM, dementia  are also affecting patient's functional outcome.   REHAB POTENTIAL: Fair within the home, but not appropriate  for OPPT.  CLINICAL DECISION MAKING: Evolving/moderate complexity  EVALUATION COMPLEXITY: Moderate  PLAN:  PT FREQUENCY: one time visit  PT DURATION: other: one time visit  PLANNED INTERVENTIONS: Therapeutic exercises, Therapeutic activity, Neuromuscular re-education, Balance training, Gait training, Patient/Family education, Self Care, and Joint mobilization  PLAN FOR NEXT SESSION: D/C in house and being sent off for referral to home health PT.   Gwenlyn Saran, PT, DPT Physical Therapist- [facility]  05/15/22, 2:43 PM

## 2022-05-20 ENCOUNTER — Telehealth: Payer: Self-pay | Admitting: Family Medicine

## 2022-05-20 ENCOUNTER — Ambulatory Visit: Payer: HMO

## 2022-05-20 DIAGNOSIS — D631 Anemia in chronic kidney disease: Secondary | ICD-10-CM | POA: Diagnosis not present

## 2022-05-20 DIAGNOSIS — M170 Bilateral primary osteoarthritis of knee: Secondary | ICD-10-CM | POA: Diagnosis not present

## 2022-05-20 DIAGNOSIS — Z6837 Body mass index (BMI) 37.0-37.9, adult: Secondary | ICD-10-CM | POA: Diagnosis not present

## 2022-05-20 DIAGNOSIS — J302 Other seasonal allergic rhinitis: Secondary | ICD-10-CM | POA: Diagnosis not present

## 2022-05-20 DIAGNOSIS — Z86010 Personal history of colonic polyps: Secondary | ICD-10-CM | POA: Diagnosis not present

## 2022-05-20 DIAGNOSIS — R1310 Dysphagia, unspecified: Secondary | ICD-10-CM | POA: Diagnosis not present

## 2022-05-20 DIAGNOSIS — Z9181 History of falling: Secondary | ICD-10-CM | POA: Diagnosis not present

## 2022-05-20 DIAGNOSIS — N183 Chronic kidney disease, stage 3 unspecified: Secondary | ICD-10-CM | POA: Diagnosis not present

## 2022-05-20 DIAGNOSIS — I129 Hypertensive chronic kidney disease with stage 1 through stage 4 chronic kidney disease, or unspecified chronic kidney disease: Secondary | ICD-10-CM | POA: Diagnosis not present

## 2022-05-20 DIAGNOSIS — G301 Alzheimer's disease with late onset: Secondary | ICD-10-CM | POA: Diagnosis not present

## 2022-05-20 DIAGNOSIS — E7800 Pure hypercholesterolemia, unspecified: Secondary | ICD-10-CM | POA: Diagnosis not present

## 2022-05-20 DIAGNOSIS — E1122 Type 2 diabetes mellitus with diabetic chronic kidney disease: Secondary | ICD-10-CM | POA: Diagnosis not present

## 2022-05-20 DIAGNOSIS — E785 Hyperlipidemia, unspecified: Secondary | ICD-10-CM | POA: Diagnosis not present

## 2022-05-20 DIAGNOSIS — E669 Obesity, unspecified: Secondary | ICD-10-CM | POA: Diagnosis not present

## 2022-05-20 DIAGNOSIS — N281 Cyst of kidney, acquired: Secondary | ICD-10-CM | POA: Diagnosis not present

## 2022-05-20 DIAGNOSIS — F0283 Dementia in other diseases classified elsewhere, unspecified severity, with mood disturbance: Secondary | ICD-10-CM | POA: Diagnosis not present

## 2022-05-20 DIAGNOSIS — K219 Gastro-esophageal reflux disease without esophagitis: Secondary | ICD-10-CM | POA: Diagnosis not present

## 2022-05-20 DIAGNOSIS — H5203 Hypermetropia, bilateral: Secondary | ICD-10-CM | POA: Diagnosis not present

## 2022-05-20 DIAGNOSIS — Z79899 Other long term (current) drug therapy: Secondary | ICD-10-CM | POA: Diagnosis not present

## 2022-05-20 DIAGNOSIS — F331 Major depressive disorder, recurrent, moderate: Secondary | ICD-10-CM | POA: Diagnosis not present

## 2022-05-20 NOTE — Telephone Encounter (Signed)
Home Health Verbal Orders - Caller/Agency: Sony from [facility] Number: [phone] [id number] PT Frequency: 1x7

## 2022-05-20 NOTE — Telephone Encounter (Signed)
verbal orders given

## 2022-05-21 ENCOUNTER — Telehealth: Payer: Self-pay | Admitting: Family Medicine

## 2022-05-21 NOTE — Telephone Encounter (Unsigned)
Copied from [place] [id number]. Topic: Quick Communication - Home Health Verbal Orders >> May 21, 2022 12:48 PM Cyndi Bender wrote: Caller/Agency: Colletta [place] with [facility]  Callback Number: [phone] Requesting OT/PT/Skilled Nursing/Social Work/Speech Therapy: OT Frequency: 1 time a week for 6 weeks

## 2022-05-21 NOTE — Telephone Encounter (Signed)
verbal orders given

## 2022-05-22 ENCOUNTER — Ambulatory Visit
Admission: RE | Admit: 2022-05-22 | Discharge: 2022-05-22 | Disposition: A | Discharge status: Home / Self Care | Payer: HMO | Source: Ambulatory Visit | Attending: Family Medicine | Admitting: Family Medicine

## 2022-05-22 ENCOUNTER — Ambulatory Visit: Payer: HMO

## 2022-05-22 DIAGNOSIS — Z1231 Encounter for screening mammogram for malignant neoplasm of breast: Secondary | ICD-10-CM | POA: Diagnosis not present

## 2022-05-26 ENCOUNTER — Emergency Department: Payer: PPO

## 2022-05-26 ENCOUNTER — Other Ambulatory Visit: Payer: Self-pay

## 2022-05-26 ENCOUNTER — Emergency Department
Admission: EM | Admit: 2022-05-26 | Discharge: 2022-05-30 | Disposition: A | Discharge status: Home / Self Care | Payer: PPO | Attending: Student in an Organized Health Care Education/Training Program | Admitting: Student in an Organized Health Care Education/Training Program

## 2022-05-26 ENCOUNTER — Telehealth: Payer: Self-pay | Admitting: Family Medicine

## 2022-05-26 ENCOUNTER — Encounter: Payer: Self-pay | Admitting: Emergency Medicine

## 2022-05-26 DIAGNOSIS — M25562 Pain in left knee: Secondary | ICD-10-CM | POA: Diagnosis not present

## 2022-05-26 DIAGNOSIS — M79662 Pain in left lower leg: Secondary | ICD-10-CM | POA: Diagnosis not present

## 2022-05-26 DIAGNOSIS — M25462 Effusion, left knee: Secondary | ICD-10-CM | POA: Insufficient documentation

## 2022-05-26 DIAGNOSIS — M79605 Pain in left leg: Secondary | ICD-10-CM | POA: Diagnosis not present

## 2022-05-26 DIAGNOSIS — R410 Disorientation, unspecified: Secondary | ICD-10-CM | POA: Diagnosis not present

## 2022-05-26 DIAGNOSIS — M5412 Radiculopathy, cervical region: Secondary | ICD-10-CM | POA: Diagnosis not present

## 2022-05-26 DIAGNOSIS — M47812 Spondylosis without myelopathy or radiculopathy, cervical region: Secondary | ICD-10-CM | POA: Diagnosis not present

## 2022-05-26 DIAGNOSIS — M8588 Other specified disorders of bone density and structure, other site: Secondary | ICD-10-CM | POA: Diagnosis not present

## 2022-05-26 DIAGNOSIS — R531 Weakness: Secondary | ICD-10-CM | POA: Diagnosis not present

## 2022-05-26 DIAGNOSIS — M47816 Spondylosis without myelopathy or radiculopathy, lumbar region: Secondary | ICD-10-CM | POA: Diagnosis not present

## 2022-05-26 DIAGNOSIS — R0902 Hypoxemia: Secondary | ICD-10-CM | POA: Diagnosis not present

## 2022-05-26 DIAGNOSIS — R262 Difficulty in walking, not elsewhere classified: Secondary | ICD-10-CM | POA: Diagnosis not present

## 2022-05-26 DIAGNOSIS — F0390 Unspecified dementia without behavioral disturbance: Secondary | ICD-10-CM | POA: Insufficient documentation

## 2022-05-26 DIAGNOSIS — I10 Essential (primary) hypertension: Secondary | ICD-10-CM | POA: Diagnosis not present

## 2022-05-26 DIAGNOSIS — Z1152 Encounter for screening for COVID-19: Secondary | ICD-10-CM | POA: Insufficient documentation

## 2022-05-26 DIAGNOSIS — G319 Degenerative disease of nervous system, unspecified: Secondary | ICD-10-CM | POA: Diagnosis not present

## 2022-05-26 DIAGNOSIS — M1612 Unilateral primary osteoarthritis, left hip: Secondary | ICD-10-CM | POA: Diagnosis not present

## 2022-05-26 LAB — URINALYSIS, ROUTINE W REFLEX MICROSCOPIC
Bilirubin Urine: NEGATIVE
Glucose, UA: NEGATIVE mg/dL
Hgb urine dipstick: NEGATIVE
Ketones, ur: NEGATIVE mg/dL
Leukocytes,Ua: NEGATIVE
Nitrite: NEGATIVE
Protein, ur: NEGATIVE mg/dL
Specific Gravity, Urine: 1.02 (ref 1.005–1.030)
pH: 5 (ref 5.0–8.0)

## 2022-05-26 LAB — CBC WITH DIFFERENTIAL/PLATELET
Abs Immature Granulocytes: 0.06 10*3/uL (ref 0.00–0.07)
Basophils Absolute: 0.1 10*3/uL (ref 0.0–0.1)
Basophils Relative: 0 %
Eosinophils Absolute: 0 10*3/uL (ref 0.0–0.5)
Eosinophils Relative: 0 %
HCT: 37.7 % (ref 36.0–46.0)
Hemoglobin: 11.9 g/dL — ABNORMAL LOW (ref 12.0–15.0)
Immature Granulocytes: 0 %
Lymphocytes Relative: 8 %
Lymphs Abs: 1.3 10*3/uL (ref 0.7–4.0)
MCH: 27.5 pg (ref 26.0–34.0)
MCHC: 31.6 g/dL (ref 30.0–36.0)
MCV: 87.1 fL (ref 80.0–100.0)
Monocytes Absolute: 0.9 10*3/uL (ref 0.1–1.0)
Monocytes Relative: 5 %
Neutro Abs: 14.7 10*3/uL — ABNORMAL HIGH (ref 1.7–7.7)
Neutrophils Relative %: 87 %
Platelets: 466 10*3/uL — ABNORMAL HIGH (ref 150–400)
RBC: 4.33 MIL/uL (ref 3.87–5.11)
RDW: 14.7 % (ref 11.5–15.5)
WBC: 17 10*3/uL — ABNORMAL HIGH (ref 4.0–10.5)
nRBC: 0 % (ref 0.0–0.2)

## 2022-05-26 LAB — BASIC METABOLIC PANEL
Anion gap: 9 (ref 5–15)
BUN: 14 mg/dL (ref 8–23)
CO2: 24 mmol/L (ref 22–32)
Calcium: 9.7 mg/dL (ref 8.9–10.3)
Chloride: 107 mmol/L (ref 98–111)
Creatinine, Ser: 1.37 mg/dL — ABNORMAL HIGH (ref 0.44–1.00)
GFR, Estimated: 41 mL/min — ABNORMAL LOW (ref >60)
Glucose, Bld: 107 mg/dL — ABNORMAL HIGH (ref 70–99)
Potassium: 3.6 mmol/L (ref 3.5–5.1)
Sodium: 140 mmol/L (ref 135–145)

## 2022-05-26 LAB — TROPONIN I (HIGH SENSITIVITY)
Troponin I (High Sensitivity): 6 ng/L (ref <18)
Troponin I (High Sensitivity): 7 ng/L (ref <18)

## 2022-05-26 LAB — SYNOVIAL CELL COUNT + DIFF, W/ CRYSTALS
Crystals, Fluid: NONE SEEN
Eosinophils-Synovial: 0 %
Lymphocytes-Synovial Fld: 1 %
Monocyte-Macrophage-Synovial Fluid: 3 %
Neutrophil, Synovial: 96 %
WBC, Synovial: 2958 /mm3 — ABNORMAL HIGH (ref 0–200)

## 2022-05-26 LAB — CK: Total CK: 334 U/L — ABNORMAL HIGH (ref 38–234)

## 2022-05-26 LAB — RESP PANEL BY RT-PCR (RSV, FLU A&B, COVID)  RVPGX2
Influenza A by PCR: NEGATIVE
Influenza B by PCR: NEGATIVE
Resp Syncytial Virus by PCR: NEGATIVE
SARS Coronavirus 2 by RT PCR: NEGATIVE

## 2022-05-26 LAB — URIC ACID: Uric Acid, Serum: 5.6 mg/dL (ref 2.5–7.1)

## 2022-05-26 LAB — BRAIN NATRIURETIC PEPTIDE: B Natriuretic Peptide: 62.1 pg/mL (ref 0.0–100.0)

## 2022-05-26 LAB — LACTATE DEHYDROGENASE: LDH: 151 U/L (ref 98–192)

## 2022-05-26 MED ORDER — AMLODIPINE BESYLATE 5 MG PO TABS
2.5000 mg | ORAL_TABLET | Freq: Every day | ORAL | Status: DC
  Administered 2022-05-27 – 2022-05-30 (×4): 2.5 mg via ORAL
  Filled 2022-05-26 (×4): qty 1

## 2022-05-26 MED ORDER — IRBESARTAN 150 MG PO TABS
300.0000 mg | ORAL_TABLET | Freq: Every day | ORAL | Status: DC
  Administered 2022-05-27 – 2022-05-30 (×4): 300 mg via ORAL
  Filled 2022-05-26 (×4): qty 2

## 2022-05-26 MED ORDER — DIVALPROEX SODIUM 125 MG PO CSDR
125.0000 mg | DELAYED_RELEASE_CAPSULE | Freq: Two times a day (BID) | ORAL | Status: DC
  Administered 2022-05-27 – 2022-05-30 (×8): 125 mg via ORAL
  Filled 2022-05-26 (×9): qty 1

## 2022-05-26 MED ORDER — MIRTAZAPINE 15 MG PO TBDP
15.0000 mg | ORAL_TABLET | Freq: Every day | ORAL | Status: DC
  Administered 2022-05-26 – 2022-05-29 (×4): 15 mg via ORAL
  Filled 2022-05-26 (×5): qty 1

## 2022-05-26 MED ORDER — SODIUM CHLORIDE 0.9 % IV BOLUS
500.0000 mL | Freq: Once | INTRAVENOUS | Status: AC
  Administered 2022-05-26: 500 mL via INTRAVENOUS

## 2022-05-26 MED ORDER — LIDOCAINE HCL (PF) 1 % IJ SOLN
5.0000 mL | Freq: Once | INTRAMUSCULAR | Status: AC
  Administered 2022-05-26: 5 mL via INTRADERMAL
  Filled 2022-05-26: qty 5

## 2022-05-26 NOTE — ED Triage Notes (Signed)
Patient to ED via [facility] from home for left leg pain. Patient not walking like normal. Patient's MD concerned for blood clot. Hx of dementia. Aox1- self, baseline for patient.

## 2022-05-26 NOTE — ED Notes (Signed)
Xray and [place] unable to obtain scans due to pt being unable to stand and is getting in wheelchair.

## 2022-05-26 NOTE — ED Provider Triage Note (Signed)
Emergency Medicine Provider Triage Evaluation Note  Yvonne Curtis , a 72 y.o. female  was evaluated in triage.  Pt complains of left leg pain and trouble walking for the past couple of days. Husband reports that she slid out of bed two days ago but he caught her. She has not been bearing weight since. PCP instructed them to come to ED for evaluate of blood clot. No history of DVT/PE. Per husband, no change in mental status. No infectious symptoms.   Review of Systems  Positive: Leg pain Negative: Confusion, fever  Physical Exam  There were no vitals taken for this visit. Gen:   Awake, no distress   Resp:  Normal effort  MSK:   Pain with PROM of left hip and knee. Mild edema. No erythema/warmth Other:    Medical Decision Making  Medically screening exam initiated at 1:07 PM.  Appropriate orders placed.  Yvonne Curtis was informed that the remainder of the evaluation will be completed by another provider, this initial triage assessment does not replace that evaluation, and the importance of remaining in the ED until their evaluation is complete.     Marquette Old, PA-C 05/26/22 1312

## 2022-05-26 NOTE — ED Notes (Signed)
Pt care taken, is having fluid drawn off of her knees, Alert and oriented x 4. Waiting to go to ct

## 2022-05-26 NOTE — ED Provider Notes (Signed)
[facility] Provider Note    Event Date/Time   First MD Initiated Contact with Patient 05/26/22 1536     (approximate)   History   Leg Pain   HPI  Yvonne Curtis is a 72 y.o. female history of of type [place] as well as dementia presents from home due to decline in status over the past 3 days.  Reportedly has some chronic bilateral knee pain left becoming greater than normal.  No specific falls.  No reported fevers.  Patient unable to provide much additional history.  Family reports that her condition has gotten the point where they are no longer able to care for her at home.     Physical Exam   Triage Vital Signs: ED Triage Vitals [05/26/22 1313]  Enc Vitals Group     BP (!) 206/174     Pulse Rate 92     Resp 18     Temp 98.1 F (36.7 C)     Temp Source Oral     SpO2 100 %     Weight      Height      Head Circumference      Peak Flow      Pain Score      Pain Loc      Pain Edu?      Excl. in [place]?     Most recent vital signs: Vitals:   05/26/22 2000 05/26/22 2100  BP: 129/87 134/66  Pulse: 83 79  Resp: 20 18  Temp:  98.2 F (36.8 C)  SpO2: 96% 100%     Constitutional: Alert, oriented x 1 Eyes: Conjunctivae are normal.  Head: Atraumatic. Nose: No congestion/rhinnorhea. Mouth/Throat: Mucous membranes are moist.   Neck: Painless ROM.  Cardiovascular:   Good peripheral circulation. Respiratory: Normal respiratory effort.  No retractions.  Gastrointestinal: Soft and nontender.  Musculoskeletal: Left knee with effusion and some warmth.  No overlying cellulitis. Neurologic:  MAE spontaneously. No gross focal neurologic deficits are appreciated.  Skin:  Skin is warm, dry and intact. No rash noted. Psychiatric: Calm and cooperative    ED Results / Procedures / Treatments   Labs (all labs ordered are listed, but only abnormal results are displayed) Labs Reviewed  CBC WITH DIFFERENTIAL/PLATELET - Abnormal; Notable for the  following components:      Result Value   WBC 17.0 (*)    Hemoglobin 11.9 (*)    Platelets 466 (*)    Neutro Abs 14.7 (*)    All other components within normal limits  BASIC METABOLIC PANEL - Abnormal; Notable for the following components:   Glucose, Bld 107 (*)    Creatinine, Ser 1.37 (*)    GFR, Estimated 41 (*)    All other components within normal limits  CK - Abnormal; Notable for the following components:   Total CK 334 (*)    All other components within normal limits  SYNOVIAL CELL COUNT + DIFF, W/ CRYSTALS - Abnormal; Notable for the following components:   Color, Synovial YELLOW (*)    Appearance-Synovial HAZY (*)    WBC, Synovial 2,958 (*)    All other components within normal limits  RESP PANEL BY RT-PCR (RSV, FLU A&B, COVID)  RVPGX2  URIC ACID  BRAIN NATRIURETIC PEPTIDE  LACTATE DEHYDROGENASE  URINALYSIS, ROUTINE W REFLEX MICROSCOPIC  VALPROIC ACID LEVEL  TROPONIN I (HIGH SENSITIVITY)  TROPONIN I (HIGH SENSITIVITY)        RADIOLOGY Please see ED Course for  my review and interpretation.  I personally reviewed all radiographic images ordered to evaluate for the above acute complaints and reviewed radiology reports and findings.  These findings were personally discussed with the patient.  Please see medical record for radiology report.    PROCEDURES:  Critical Care performed:   .Joint Aspiration/Arthrocentesis  Date/Time: 05/26/2022 10:46 PM  Performed by: Merlyn Lot, MD Authorized by: Merlyn Lot, MD   Consent:    Consent obtained:  Verbal Location:    Location:  Knee Anesthesia:    Anesthesia method:  Local infiltration   Local anesthetic:  Lidocaine 1% w/o epi Procedure details:    Needle gauge:  18 G   Approach:  Lateral   Aspirate amount:  20   Aspirate characteristics:  Yellow and clear   Steroid injected: no     Specimen collected: yes   Post-procedure details:    Dressing:  Sterile dressing   Procedure completion:   Tolerated    MEDICATIONS ORDERED IN ED: Medications  amLODipine (NORVASC) tablet 2.5 mg (has no administration in time range)  divalproex (DEPAKOTE SPRINKLE) capsule 125 mg (has no administration in time range)  mirtazapine (REMERON SOL-TAB) disintegrating tablet 15 mg (has no administration in time range)  irbesartan (AVAPRO) tablet 300 mg (has no administration in time range)  lidocaine (PF) (XYLOCAINE) 1 % injection 5 mL (5 mLs Intradermal Given 05/26/22 1701)  sodium chloride 0.9 % bolus 500 mL (500 mLs Intravenous New Bag/Given 05/26/22 2133)     IMPRESSION / MDM / [place] / ED COURSE  I reviewed the triage vital signs and the nursing notes.                              Differential diagnosis includes, but is not limited to, fracture, contusion, DVT, arthritis, septic arthritis, gout, CVA, dehydration, sepsis, UTI  Patient presenting to the ER for evaluation of symptoms as described above.  Based on symptoms, risk factors and considered above differential, this presenting complaint could reflect a potentially life-threatening illness therefore the patient will be placed on continuous pulse oximetry and telemetry for monitoring.  Laboratory evaluation will be sent to evaluate for the above complaints.       Clinical Course as of 05/26/22 2259  Mon May 26, 2022  1643 US Venous Img Lower  Left (DVT Study) [PR]  1908 Uric acid not elevated but given the warmth and pain left knee with inability to bear weight recommended arthrocentesis for diagnostic testing.  Patient family consented to procedure.  Patient tolerated well.  Is fairly clear-tinged yellow fluid I feel like this is more consistent with chronic arthritis less likely septic joint.  Will be sent for synovial studies. [PR]  2057 Synovial fluid analysis more consistent with inflammatory arthritis.  Very low suspicion for septic arthritis at this point.  Will add on culture and LDH as there are many PMNs. [PR]  2152  LDH lipid due for review.  This is consistent with arthritis.  Not consistent with infection.  Still awaiting viral panel as well as urinalysis. [PR]  2247 Patient be signed out to oncoming physician pending follow-up urinalysis viral panel.  If no indication identified for medical admission will be kept for PT OT eval. [PR]    Clinical Course User Index [PR] Merlyn Lot, MD     FINAL CLINICAL IMPRESSION(S) / ED DIAGNOSES   Final diagnoses:  Left leg pain     Rx /  DC Orders   ED Discharge Orders     None        Note:  This document was prepared using Dragon voice recognition software and may include unintentional dictation errors.    Merlyn Lot, MD 05/26/22 2300

## 2022-05-26 NOTE — Telephone Encounter (Signed)
Patients daughter Yvonne Curtis states that her mother has dementia and her health has declined over the last two days her mother is not able to walk stating that her legs are hurting. [name] is wanting to Dr. Ancil Boozer to give a call to let her know what direction she should be taking.

## 2022-05-26 NOTE — ED Notes (Signed)
Pt moved to room 24  report off to vet rn

## 2022-05-26 NOTE — ED Notes (Signed)
Pt with dementia.  Pt not walking now for 3 days per husband.  Husband requesting rehab for pt.  No known injury to pt/legs.  Pt unable to use walker per husband.   Pt sleeping.  Pt in hallway bed.

## 2022-05-26 NOTE — ED Triage Notes (Addendum)
First nurse note: Pt to ED [facility] from home. Inability to walk x2 days. Pt c/o left leg pain per family. Pt unable to follow commands which is baseline. Pt hx dementia. No recent falls. Ems unable to perform stroke screen d/t AMS. Has not had regular medications toady.   EMS VS: BP 160/98 -- 142/104 O2 100% RA HR 90 NSR  CBG 129 Temp 99.0 oral

## 2022-05-26 NOTE — ED Notes (Signed)
Daughter with pt

## 2022-05-27 ENCOUNTER — Ambulatory Visit: Payer: HMO

## 2022-05-27 LAB — VALPROIC ACID LEVEL: Valproic Acid Lvl: 13 ug/mL — ABNORMAL LOW (ref 50.0–100.0)

## 2022-05-27 NOTE — Telephone Encounter (Signed)
Copied from [place] [id number]. Topic: General - Inquiry >> May 27, 2022  2:04 PM Erskine Squibb wrote: Reason for CRM: Howell Pringle with [facility] called in stating she spoke with the patients husband and he told her she is being admitted to [facility]. She is also going to be going to rehab when she is discharged. Please assist further if necessary.

## 2022-05-27 NOTE — ED Notes (Signed)
Pt's family updated. Explained to family that case management is working on either getting pt placed in facility or trying to set up home care for the patient. Pt's family voiced understanding. Pt's husband is leaving to go home, pt's sister just arrived and is now at bedside.

## 2022-05-27 NOTE — ED Notes (Signed)
Pt's husband at bedside feeding her breakfast.

## 2022-05-27 NOTE — Evaluation (Signed)
Physical Therapy Evaluation Patient Details Name: Yvonne Curtis MRN: [id number] DOB: 1951/03/26 Today's Date: 05/27/2022  History of Present Illness  presented to ER secondary to L LE pain, generalized weakness and inability to ambulate. Imaging negative for acute pathology; did note mild/mod c-spine stenosis  Clinical Impression  Patient resting in bed with husband at bedside; eyes initially closed, but verbally responding and participating with bits of food provided by husband.  Inconsistent command following, often requiring hand-over-hand for task initiation and completion; intermittently resistive to movement attempts.  Moderate grimacing and clinical indicators of pain with overpressure and act/assist movement of L hip/knee (PAINAD 4/10); improved with rest and repositioning.  Currently requiring total assist +2 for bed mobility; min assist for unsupported sitting balance; max assist +2 with RW for partial sit/stand.  Requires total assist for LE positioning, blocking during transfer; total assist for UE placement on RW. unable to fully clear buttocks from bed surface despite max assist +2. Very heavy posterior lean with significant resistance to further mobility attempts.    Would benefit from skilled PT to address above deficits and promote optimal return to PLOF.;d recommend transition to STR upon discharge from acute hospitalization.  Will monitor ability to actively participate and progress in subsequent sessions, updating recommendations and POC as appropriate.      Recommendations for follow up therapy are one component of a multi-disciplinary discharge planning process, led by the attending physician.  Recommendations may be updated based on patient status, additional functional criteria and insurance authorization.  Follow Up Recommendations Skilled nursing-short term rehab (<3 hours/day) Can patient physically be transported by private vehicle: No    Assistance Recommended at  Discharge Frequent or constant Supervision/Assistance  Patient can return home with the following  Two people to help with bathing/dressing/bathroom;Two people to help with walking and/or transfers    Equipment Recommendations    Recommendations for Other Services       Functional Status Assessment Patient has had a recent decline in their functional status and demonstrates the ability to make significant improvements in function in a reasonable and predictable amount of time.     Precautions / Restrictions Precautions Precautions: Fall      Mobility  Bed Mobility Overal bed mobility: Needs Assistance Bed Mobility: Sit to Supine, Supine to Sit     Supine to sit: +2 for safety/equipment, Total assist Sit to supine: +2 for safety/equipment, Total assist   General bed mobility comments: poor ability to comprehend verbal instruction for transfer, hand-over-hand assist required throughout. Intermittently resistant to movement attempts    Transfers Overall transfer level: Needs assistance Equipment used: Rolling walker (2 wheels) Transfers: Sit to/from Stand Sit to Stand: Max assist, From elevated surface, +2 safety/equipment           General transfer comment: total assist for LE positioning, blocking during transfer; total assist for UE placement on RW. unable to fully clear buttocks from bed surface despite max assist +2.  Very heavy posterior lean with significant resistance to further mobility attempts.    Ambulation/Gait               General Gait Details: unsafe/unable  Stairs            Wheelchair Mobility    Modified Rankin (Stroke Patients Only)       Balance Overall balance assessment: Needs assistance Sitting-balance support: No upper extremity supported, Feet supported       Standing balance support: Bilateral upper extremity supported Standing balance-Leahy Scale: Zero  Pertinent Vitals/Pain  Pain Assessment Breathing: normal Negative Vocalization: occasional moan/groan, low speech, negative/disapproving quality Facial Expression: facial grimacing Body Language: tense, distressed pacing, fidgeting Consolability: no need to console PAINAD Score: 4 Pain Location: L knee/ankle Pain Descriptors / Indicators: Guarding, Grimacing Pain Intervention(s): Limited activity within patient's tolerance, Monitored during session, Repositioned    Home Living Family/patient expects to be discharged to:: Private residence   Available Help at Discharge: Family;Available 24 hours/day Type of Home: House Home Access: Stairs to enter Entrance Stairs-Rails: Left Entrance Stairs-Number of Steps: 1-2   Home Layout: One level Home Equipment: Rollator (4 wheels)      Prior Function Prior Level of Function : Needs assist             Mobility Comments: +1 assist from husband for ADLs, household mobilization without assist device; does have 4WRW, but "doesn't understand how to use it" per husband       Hand Dominance        Extremity/Trunk Assessment   Upper Extremity Assessment Upper Extremity Assessment: Overall WFL for tasks assessed (grossly at least 4-/5 throughout)    Lower Extremity Assessment Lower Extremity Assessment: Generalized weakness (R LE grossly 4-/5, L LE grossly 3/5 (grimacing with movement of L knee/ankle))       Communication   Communication: No difficulties  Cognition Arousal/Alertness: Awake/alert Behavior During Therapy: Anxious, Restless Overall Cognitive Status: History of cognitive impairments - at baseline                                 General Comments: Oriented to self only; follows some simple commands, but inconsistent.  Intermittently resistant to act assist movement, generally fearful of movement efforts        General Comments      Exercises     Assessment/Plan    PT Assessment Patient needs continued PT services   PT Problem List Decreased strength;Decreased range of motion;Decreased activity tolerance;Decreased balance;Decreased mobility;Decreased cognition;Decreased knowledge of use of DME;Decreased safety awareness;Decreased knowledge of precautions       PT Treatment Interventions DME instruction;Gait training;Stair training;Therapeutic exercise;Functional mobility training;Balance training;Therapeutic activities;Patient/family education;Cognitive remediation    PT Goals (Current goals can be found in the Care Plan section)  Acute Rehab PT Goals Patient Stated Goal: per husband, to be able to walk so i can take her back home PT Goal Formulation: With family Time For Goal Achievement: 06/10/22 Potential to Achieve Goals: Fair    Frequency Min 2X/week (trial, pending ability to actively participate/progress with skilled intervention)     Co-evaluation               AM-PAC PT "6 Clicks" Mobility  Outcome Measure Help needed turning from your back to your side while in a flat bed without using bedrails?: Total Help needed moving from lying on your back to sitting on the side of a flat bed without using bedrails?: Total Help needed moving to and from a bed to a chair (including a wheelchair)?: Total Help needed standing up from a chair using your arms (e.g., wheelchair or bedside chair)?: Total Help needed to walk in hospital room?: Total Help needed climbing 3-5 steps with a railing? : Total 6 Click Score: 6    End of Session Equipment Utilized During Treatment: Gait belt Activity Tolerance: Patient limited by pain (limited by cognition) Patient left: with call bell/phone within reach;with family/visitor present;in bed Nurse Communication: Mobility status PT Visit  Diagnosis: Muscle weakness (generalized) (M62.81);Difficulty in walking, not elsewhere classified (R26.2)    Time: 1595-3967 PT Time Calculation (min) (ACUTE ONLY): 25 min   Charges:   PT Evaluation $PT Eval Moderate  Complexity: 1 Mod          Brynna Dobos H. Owens Shark, PT, DPT, NCS 05/27/22, 11:37 PM [phone]

## 2022-05-27 NOTE — ED Notes (Signed)
Report given to Chesley Noon RN

## 2022-05-27 NOTE — NC FL2 (Addendum)
[place] LEVEL OF CARE FORM     IDENTIFICATION  Patient Name: Yvonne Curtis Birthdate: 09/03/1950 Sex: female Admission Date (Current Location): 05/26/2022  [place] and [place] Number:  Engineering geologist and Address:  [facility], [address], [place], [place] [address]      Provider Number: [id number]  Attending Physician Name and Address:  Delman Kitten [place] [address]  Relative Name and Phone Number:  Alena Blankenbeckler- spouse- [phone]    Current Level of Care: Hospital Recommended Level of Care: [place] Prior Approval Number:    Date Approved/Denied:   PASRR Number: Pending  Discharge Plan: SNF    Current Diagnoses: Patient Active Problem List   Diagnosis Date Noted   Impaired functional mobility, balance, gait, and endurance 04/22/2022   Moderate late onset Alzheimer's dementia with mood disturbance ([place]) 12/20/2021   Seasonal allergic rhinitis 12/20/2021   Moderate protein-calorie malnutrition ([place]) 08/20/2021   Moderate episode of recurrent major depressive disorder ([place]) 08/20/2021   Cyst of kidney, acquired 07/04/2021   Anemia in chronic kidney disease 02/07/2021   Pure hypercholesterolemia, unspecified 02/07/2021   Hypercalcemia 10/11/2020   Chronic kidney disease, stage 3b ([place]) 09/04/2020   Dysphagia    History of colonic polyps    Polyp of transverse colon    Abnormal sense of taste 12/19/2019   Loss of memory 12/19/2019   Mood changes 12/19/2019   Acanthosis nigricans 11/14/2014   Benign hypertension 11/14/2014   Dyslipidemia 11/14/2014   Gastro-esophageal reflux disease without esophagitis 11/14/2014   Morbid obesity ([place]) 11/14/2014   Primary osteoarthritis of both knees 11/14/2014   Microalbuminuria 10/15/2012   Controlled type 2 diabetes mellitus with stage 3 chronic kidney disease, without long-term current use of insulin ([place]) 06/05/2009   Body  dermatophytosis 12/13/2008    Orientation RESPIRATION BLADDER Height & Weight     Self  Normal Incontinent Weight:   Height:     BEHAVIORAL SYMPTOMS/MOOD NEUROLOGICAL BOWEL NUTRITION STATUS      Incontinent Diet (Regular)  AMBULATORY STATUS COMMUNICATION OF NEEDS Skin   Extensive Assist Verbally Normal                       Personal Care Assistance Level of Assistance  Bathing, Feeding, Dressing Bathing Assistance: Maximum assistance Feeding assistance: Limited assistance Dressing Assistance: Maximum assistance     Functional Limitations Info  Sight, Speech, Hearing Sight Info: Adequate Hearing Info: Adequate      SPECIAL CARE FACTORS FREQUENCY  PT (By licensed PT), OT (By licensed OT)     PT Frequency: 5 times per week OT Frequency: 5 times per week            Contractures Contractures Info: Not present    Additional Factors Info  Code Status, Allergies Code Status Info: Full Allergies Info: Oxycodone, oxycodone           Current Medications (05/27/2022):  This is the current hospital active medication list Current Facility-Administered Medications  Medication Dose Route Frequency Provider Last Rate Last Admin   amLODipine (NORVASC) tablet 2.5 mg  2.5 mg Oral Daily Merlyn Lot, MD   2.5 mg at 05/27/22 0916   divalproex (DEPAKOTE SPRINKLE) capsule 125 mg  125 mg Oral BID Merlyn Lot, MD   125 mg at 05/27/22 1118   irbesartan (AVAPRO) tablet 300 mg  300 mg Oral Daily Merlyn Lot, MD   300 mg at 05/27/22 1118  mirtazapine (REMERON SOL-TAB) disintegrating tablet 15 mg  15 mg Oral QHS Merlyn Lot, MD   15 mg at 05/26/22 2359   Current Outpatient Medications  Medication Sig Dispense Refill   amLODipine (NORVASC) 2.5 MG tablet Take 1 tablet by mouth once daily 90 tablet 0   cetirizine HCl (ZYRTEC) 5 MG/5ML SOLN Take 10 mLs (10 mg total) by mouth daily as needed for allergies. 300 mL 2   divalproex (DEPAKOTE SPRINKLES) 125 MG capsule  Take 1 capsule (125 mg total) by mouth 2 (two) times daily. 180 capsule 1   mirtazapine (REMERON SOL-TAB) 15 MG disintegrating tablet Take 1 tablet (15 mg total) by mouth at bedtime. 90 tablet 1   telmisartan (MICARDIS) 80 MG tablet Take 1 tablet (80 mg total) by mouth daily. 90 tablet 0   blood glucose meter kit and supplies Dispense based on patient and insurance preference. Use once daily as directed. (FOR ICD-10 E10.9, E11.9). STrips 100 Lancets 100 1 each 0   rivastigmine (EXELON) 4.6 mg/24hr Place 4.6 mg onto the skin daily.       Discharge Medications: Please see discharge summary for a list of discharge medications.  Relevant Imaging Results:  Relevant Lab Results:   Additional Information SS#- [id number]  Shelbie Hutching, RN

## 2022-05-27 NOTE — ED Notes (Signed)
Lunch tray given to pt.

## 2022-05-27 NOTE — ED Notes (Signed)
Pt's family has questions about the pt's ability to stand now that fluid was drawn off of the pt's knee last night. Pt's family member stated that if she can stand, that he will be able to take care of her at home. Message sent to PT about having a consult session with her.

## 2022-05-27 NOTE — TOC Initial Note (Signed)
Transition of Care [facility]) - Initial/Assessment Note    Patient Details  Name: Yvonne Curtis MRN: [id number] Date of Birth: 1951/05/05  Transition of Care [facility]) CM/SW Contact:    Shelbie Hutching, RN Phone Number: 05/27/2022, 2:33 PM  Clinical Narrative:                 Patient being seen in the emergency department for inability to walk at home due to pain.  Patient can typically walk with the assistance of her husband, she does not use assistive devices at home.  Husband provides all transportation.  Patient does have advanced dementia.  Patient's husband, [name], promised the patient that he would not put her in a home but he would like for her to go to short term rehab and be able to walk again so he can continue to help her around the home.  They did pull some fluid off her knee in the emergency room yesterday.  PT eval has been placed.   RNCM will begin to work on SNF for STR.  Spoke with patient's daughter, [name] via phone, she prefers [facility] or [facility] and rehab.    Expected Discharge Plan: Skilled Nursing Facility Barriers to Discharge: Continued Medical Work up   Patient Goals and CMS Choice Patient states their goals for this hospitalization and ongoing recovery are:: Patient's husband would like for patient to go for short term rehab and be able to return home CMS Medicare.gov Compare Post Acute Care list provided to:: Patient Represenative (must comment) Choice offered to / list presented to : Spouse, Adult Children      Expected Discharge Plan and Services   Discharge Planning Services: CM Consult Post Acute Care Choice: [place] arrangements for the past 2 months: Single Family Home                                      Prior Living Arrangements/Services Living arrangements for the past 2 months: Single Family Home Lives with:: Spouse Patient language and need for interpreter reviewed:: Yes Do you feel safe going back  to the place where you live?: Yes      Need for Family Participation in Patient Care: Yes (Comment) Care giver support system in place?: Yes (comment)   Criminal Activity/Legal Involvement Pertinent to Current Situation/Hospitalization: No - Comment as needed  Activities of Daily Living      Permission Sought/Granted Permission sought to share information with : Case Manager, Family Supports, Customer service manager Permission granted to share information with : Yes, Verbal Permission Granted  Share Information with NAME: Rim Thatch  Permission granted to share info w AGENCY: SNFs  Permission granted to share info w Relationship: spouse  Permission granted to share info w Contact Information: [phone]  Emotional Assessment Appearance:: Appears stated age Attitude/Demeanor/Rapport: Self-Absorbed Affect (typically observed): Happy   Alcohol / Substance Use: Not Applicable Psych Involvement: No (comment)  Admission diagnosis:  inability to walk ems Patient Active Problem List   Diagnosis Date Noted   Impaired functional mobility, balance, gait, and endurance 04/22/2022   Moderate late onset Alzheimer's dementia with mood disturbance ([place]) 12/20/2021   Seasonal allergic rhinitis 12/20/2021   Moderate protein-calorie malnutrition ([place]) 08/20/2021   Moderate episode of recurrent major depressive disorder ([place]) 08/20/2021   Cyst of kidney, acquired 07/04/2021   Anemia in chronic kidney disease 02/07/2021   Pure hypercholesterolemia, unspecified  02/07/2021   Hypercalcemia 10/11/2020   Chronic kidney disease, stage 3b ([place]) 09/04/2020   Dysphagia    History of colonic polyps    Polyp of transverse colon    Abnormal sense of taste 12/19/2019   Loss of memory 12/19/2019   Mood changes 12/19/2019   Acanthosis nigricans 11/14/2014   Benign hypertension 11/14/2014   Dyslipidemia 11/14/2014   Gastro-esophageal reflux disease without esophagitis 11/14/2014   Morbid  obesity ([place]) 11/14/2014   Primary osteoarthritis of both knees 11/14/2014   Microalbuminuria 10/15/2012   Controlled type 2 diabetes mellitus with stage 3 chronic kidney disease, without long-term current use of insulin ([place]) 06/05/2009   Body dermatophytosis 12/13/2008   PCP:  Steele Sizer, MD Pharmacy:   [facility] [address], [place] - [place] [address] Phone: [phone] Fax: [phone]  [place] ([place], [place] [address] Phone: [phone] Fax: [phone]     Social Determinants of Health (SDOH) Social History: [place]: No Food Insecurity (04/18/2021)  Housing: Low Risk  (04/18/2021)  Transportation Needs: No Transportation Needs (04/18/2021)  Alcohol Screen: Low Risk  (04/22/2022)  Depression (PHQ2-9): High Risk (04/22/2022)  Financial Resource Strain: Low Risk  (04/18/2021)  Physical Activity: Inactive (04/18/2021)  Social Connections: Socially Integrated (04/18/2021)  Stress: Stress Concern Present (04/18/2021)  Tobacco Use: Low Risk  (05/26/2022)   SDOH Interventions:     Readmission Risk Interventions     No data to display

## 2022-05-28 NOTE — TOC Progression Note (Signed)
Transition of Care [facility]) - Progression Note    Patient Details  Name: Yvonne Curtis MRN: [id number] Date of Birth: Apr 27, 1951  Transition of Care [facility]) CM/SW Contact  Shelbie Hutching, RN Phone Number: 05/28/2022, 11:15 AM  Clinical Narrative:    [name] has offered a bed, [facility] was unable to offer.  RNCM has started insurance authorization with [employer].  [facility] is pending.     Expected Discharge Plan: [place] Barriers to Discharge: Continued Medical Work up  Expected Discharge Plan and Services   Discharge Planning Services: CM Consult Post Acute Care Choice: [place] Living arrangements for the past 2 months: Single Family Home                                       Social Determinants of Health (SDOH) Interventions SDOH Screenings   Food Insecurity: No Food Insecurity (04/18/2021)  Housing: Low Risk  (04/18/2021)  Transportation Needs: No Transportation Needs (04/18/2021)  Alcohol Screen: Low Risk  (04/22/2022)  Depression (PHQ2-9): High Risk (04/22/2022)  Financial Resource Strain: Low Risk  (04/18/2021)  Physical Activity: Inactive (04/18/2021)  Social Connections: Socially Integrated (04/18/2021)  Stress: Stress Concern Present (04/18/2021)  Tobacco Use: Low Risk  (05/26/2022)    Readmission Risk Interventions     No data to display

## 2022-05-28 NOTE — ED Provider Notes (Signed)
-----------------------------------------   5:43 AM on 05/28/2022 -----------------------------------------   Blood pressure 123/63, pulse 95, temperature 98.1 F (36.7 C), resp. rate 20, SpO2 99 %.  The patient is calm and cooperative at this time.  There have been no acute events since the last update.  Awaiting disposition plan from case management/social work.    Jasmeen Fritsch, Delice Bison, DO 05/28/22 [phone]

## 2022-05-28 NOTE — ED Notes (Signed)
Patient was cleaned from a soiled diaper and clean chucks w diaper placed as well as purewick.

## 2022-05-28 NOTE — ED Notes (Signed)
Report to desie, rn

## 2022-05-28 NOTE — ED Provider Notes (Signed)
To Whom It May Concern: Please be advised that this patient will require a short-term nursing home stay - anticipated 30 days or less for rehabilitation and strengthening. The plan is for return home.    Delman Kitten, MD 05/28/22 1259

## 2022-05-29 ENCOUNTER — Ambulatory Visit: Payer: HMO

## 2022-05-29 NOTE — ED Provider Notes (Signed)
ED progress note  Patient needs a wheelchair as the patient suffers from advanced dementia which impairs their ability to perform daily activities like bathing, dressing, toileting, and ambulating in the home. A walker will not resolve issue with performing activities of daily living. A wheelchair will allow patient to safely perform daily activities. Patient can safely propel the wheelchair in the home or has a caregiver who can provide assistance. Length of need Lifetime. Accessories: elevating leg rests (ELRs), wheel locks, extensions and anti-tippers seat cushion and back cushion.    Naaman Plummer, MD 05/29/22 [phone]

## 2022-05-29 NOTE — ED Notes (Signed)
New purwick placed on pt

## 2022-05-29 NOTE — ED Notes (Signed)
Pt daughter at bedside. Denies needs at this time

## 2022-05-29 NOTE — Progress Notes (Signed)
Physical Therapy Treatment Patient Details Name: Yvonne Curtis MRN: [id number] DOB: 16-Mar-1951 Today's Date: 05/29/2022   History of Present Illness presented to ER secondary to L LE pain, generalized weakness and inability to ambulate. Imaging negative for acute pathology; did note mild/mod c-spine stenosis    PT Comments    Continues with significant pain to L LE with any/all movement attempts.  Generally restless and agitated with facilitated movement attempts.  Total assist +2 for transition to edge of bed, resistant of assist at times; requiring max assist +1-2 to maintain sitting position this date.  Demonstrates heavy pushing towards R with limited ability to redirect or facilitate improvement to neutral/midline sitting position.  Additional standing/OOB efforts unsafe at this time.     Recommendations for follow up therapy are one component of a multi-disciplinary discharge planning process, led by the attending physician.  Recommendations may be updated based on patient status, additional functional criteria and insurance authorization.  Follow Up Recommendations  Skilled nursing-short term rehab (<3 hours/day) Can patient physically be transported by private vehicle: No   Assistance Recommended at Discharge Frequent or constant Supervision/Assistance  Patient can return home with the following Two people to help with bathing/dressing/bathroom;Two people to help with walking and/or transfers   Equipment Recommendations       Recommendations for Other Services       Precautions / Restrictions Precautions Precautions: Fall Restrictions Weight Bearing Restrictions: No     Mobility  Bed Mobility Overal bed mobility: Needs Assistance Bed Mobility: Supine to Sit, Sit to Supine     Supine to sit: Total assist, +2 for physical assistance Sit to supine: Total assist, +2 for physical assistance   General bed mobility comments: generally resistive to movement attempts,  max encouragement and hand-over-hand required; once upright, max assist to maintain sitting position, heavy push towards R (unable to fully achieve midline in M/L plan this date)    Transfers                   General transfer comment: unsafe/unable    Ambulation/Gait                   Stairs             Wheelchair Mobility    Modified Rankin (Stroke Patients Only)       Balance Overall balance assessment: Needs assistance Sitting-balance support: No upper extremity supported, Feet supported Sitting balance-Leahy Scale: Poor Sitting balance - Comments: heavy pushing to R despite max cuing/faciliation (and flexed position of L UE) Postural control: Right lateral lean                                  Cognition Arousal/Alertness: Awake/alert Behavior During Therapy: Restless, Agitated, Anxious                                   General Comments: Calm and interactive when in supine; easily agitated with mobility attempts (due to pain in L LE)        Exercises Other Exercises Other Exercises: Unsupported sitting, worked to promote midline positioning with max assist +1-2 from therapist, but patient generally agitated with efforts/encouragement. Limited ability to redirect and progress movement    General Comments        Pertinent Vitals/Pain Pain Assessment Pain Assessment: PAINAD Breathing: occasional labored breathing,  short period of hyperventilation Negative Vocalization: repeated troubled calling out, loud moaning/groaning, crying Facial Expression: facial grimacing Body Language: rigid, fists clenched, knees up, pushing/pulling away, strikes out Consolability: distracted or reassured by voice/touch PAINAD Score: 8 Pain Location: L knee/ankle Pain Descriptors / Indicators: Guarding, Grimacing Pain Intervention(s): Limited activity within patient's tolerance, Monitored during session, Repositioned    Home  Living                          Prior Function            PT Goals (current goals can now be found in the care plan section) Acute Rehab PT Goals Patient Stated Goal: per husband, to be able to walk so i can take her back home PT Goal Formulation: With family Time For Goal Achievement: 06/10/22 Potential to Achieve Goals: Fair Progress towards PT goals: Progressing toward goals    Frequency    Min 2X/week (trial)      PT Plan Current plan remains appropriate    Co-evaluation              AM-PAC PT "6 Clicks" Mobility   Outcome Measure  Help needed turning from your back to your side while in a flat bed without using bedrails?: Total Help needed moving from lying on your back to sitting on the side of a flat bed without using bedrails?: Total Help needed moving to and from a bed to a chair (including a wheelchair)?: Total Help needed standing up from a chair using your arms (e.g., wheelchair or bedside chair)?: Total Help needed to walk in hospital room?: Total Help needed climbing 3-5 steps with a railing? : Total 6 Click Score: 6    End of Session   Activity Tolerance: Patient limited by fatigue;Treatment limited secondary to agitation Patient left: in bed;with family/visitor present Nurse Communication: Mobility status PT Visit Diagnosis: Muscle weakness (generalized) (M62.81);Difficulty in walking, not elsewhere classified (R26.2)     Time: 8101-7510 PT Time Calculation (min) (ACUTE ONLY): 17 min  Charges:  $Therapeutic Activity: 8-22 mins                    Shalane Florendo H. Owens Shark, PT, DPT, NCS 05/29/22, 11:18 AM [phone]

## 2022-05-29 NOTE — TOC Progression Note (Signed)
Transition of Care [facility]) - Progression Note    Patient Details  Name: Yvonne Curtis MRN: [id number] Date of Birth: [birth year]  Transition of Care [facility]) CM/SW Contact  Shelbie Hutching, RN Phone Number: 05/29/2022, 2:10 PM  Clinical Narrative:    [employer] is leaning toward denying patient for SNF due to advanced dementia and limited ability to participate with therapy.  [employer] did offer for a peer to peer but ED MD provider agrees that patient does not have great rehab potential.  RNCM called and spoke with patient's husband and his daughter, they both verbalize understanding of the situation and daughter decides not to appeal as she understands that her mother has advanced dementia and limited potential for improvement with rehab.  Family does ask for whatever services can be arranged in the home to be arranged.  Will check with [facility] to see if they are still working with [employer] and offering 20 hours after hospitalization.  Will also set up hospital bed, hoyer lift and wheelchair to be delivered to the home as well as home health services.     Expected Discharge Plan: [place] Barriers to Discharge: Continued Medical Work up  Expected Discharge Plan and Services   Discharge Planning Services: CM Consult Post Acute Care Choice: [place] Living arrangements for the past 2 months: Single Family Home                                       Social Determinants of Health (SDOH) Interventions SDOH Screenings   Food Insecurity: No Food Insecurity (04/18/2021)  Housing: Low Risk  (04/18/2021)  Transportation Needs: No Transportation Needs (04/18/2021)  Alcohol Screen: Low Risk  (04/22/2022)  Depression (PHQ2-9): High Risk (04/22/2022)  Financial Resource Strain: Low Risk  (04/18/2021)  Physical Activity: Inactive (04/18/2021)  Social Connections: Socially Integrated (04/18/2021)  Stress: Stress Concern Present  (04/18/2021)  Tobacco Use: Low Risk  (05/26/2022)    Readmission Risk Interventions     No data to display

## 2022-05-29 NOTE — ED Notes (Signed)
Pt repositioned in the bed, purewick in place, brief dry. VS wnl. Spouse at bedside. No further needs at this time

## 2022-05-29 NOTE — ED Notes (Signed)
Pt asleep at this time, family at bedside

## 2022-05-29 NOTE — ED Notes (Signed)
Introduced self to pt and family at this time, denies needs.

## 2022-05-29 NOTE — TOC Progression Note (Signed)
Transition of Care [facility]) - Progression Note    Patient Details  Name: Yvonne Curtis MRN: [id number] Date of Birth: 1950/06/19  Transition of Care [facility]) CM/SW Contact  Shelbie Hutching, RN Phone Number: 05/29/2022, 2:51 PM  Clinical Narrative:    Hospital bed and hoyer lift and wheelchair ordered from [facility].  Trying to get everything set up to be delivered tomorrow.   1515- received a call back from [facility] and unfortunately they are not currently in network with [employer].   Referral for hospital bed and wheelchair and hoyer lift now sent to [facility].    [facility] will be able to provide home health services, patient is already open with them.  [name] with [facility] is aware that patient may discharge from the ED tomorrow.   [place] about custodial care, and reached out to [facility] about them approving patient for 20 hours of custodial care.  Patient has to have been under observation or admitted to the hospital or have had an outpatient procedure.  Patient does not qualify for Custodial care to be covered by [employer].  RNCM has called and left a message for [facility] to see if they could offer any respite care vouchers for in home care.       Expected Discharge Plan: [place] Barriers to Discharge: Continued Medical Work up  Expected Discharge Plan and Services   Discharge Planning Services: CM Consult Post Acute Care Choice: [place] arrangements for the past 2 months: Single Family Home                 DME Arranged: Hospital bed DME Agency: [facility] Date DME Agency Contacted: 05/29/22 Time DME Agency Contacted: [id number] Representative spoke with at DME Agency: [place]: RN, PT, OT, Nurse's Aide, Social Work [employer] Agency: [facility] Date [place]: 05/29/22 Time [place]: [place] Representative spoke with at [place]: [place] ([place]) Interventions SDOH Screenings   Food Insecurity: No Food Insecurity (04/18/2021)  Housing: Low Risk  (04/18/2021)  Transportation Needs: No Transportation Needs (04/18/2021)  Alcohol Screen: Low Risk  (04/22/2022)  Depression (PHQ2-9): High Risk (04/22/2022)  Financial Resource Strain: Low Risk  (04/18/2021)  Physical Activity: Inactive (04/18/2021)  Social Connections: Socially Integrated (04/18/2021)  Stress: Stress Concern Present (04/18/2021)  Tobacco Use: Low Risk  (05/26/2022)    Readmission Risk Interventions     No data to display

## 2022-05-29 NOTE — ED Notes (Signed)
Husband at bedside and pt resting with eyes closed and even respirations

## 2022-05-29 NOTE — ED Notes (Signed)
PT at bedside.

## 2022-05-30 DIAGNOSIS — R29898 Other symptoms and signs involving the musculoskeletal system: Secondary | ICD-10-CM | POA: Diagnosis not present

## 2022-05-30 DIAGNOSIS — M6281 Muscle weakness (generalized): Secondary | ICD-10-CM | POA: Diagnosis not present

## 2022-05-30 DIAGNOSIS — Z7401 Bed confinement status: Secondary | ICD-10-CM | POA: Diagnosis not present

## 2022-05-30 NOTE — TOC Transition Note (Signed)
Transition of Care [facility]) - CM/SW Discharge Note   Patient Details  Name: Yvonne Curtis MRN: [id number] Date of Birth: 1951/05/03  Transition of Care [facility]) CM/SW Contact:  Shelbie Hutching, RN Phone Number: 05/30/2022, 1:42 PM   Clinical Narrative:    Hospital bed has been delivered and set up at the home.  [name] with [facility] notified of discharge and that patient will need services as soon as possible.  ED secretary will call for EMS transport.  [employer] has authorized for EMS transport- Auth # [id number].     Final next level of care: Home w Home Health Services Barriers to Discharge: Barriers Resolved   Patient Goals and CMS Choice CMS Medicare.gov Compare Post Acute Care list provided to:: Patient Represenative (must comment) Choice offered to / list presented to : Spouse, Adult Children  Discharge Placement                  Patient to be transferred to facility by: Patient to transfer home via EMS Name of family member notified: [name]- daughter Patient and family notified of of transfer: 05/30/22  Discharge Plan and Services Additional resources added to the After Visit Summary for     Discharge Planning Services: CM Consult Post Acute Care Choice: [place]          DME Arranged: Hospital bed, Wheelchair manual DME Agency: [facility] Date DME Agency Contacted: 05/29/22 Time DME Agency Contacted: 1500 Representative spoke with at DME Agency: Suanne Marker HH Arranged: RN, PT, OT, Nurse's Aide, Social Work [employer] Agency: [facility] [place] Date [place]: 05/30/22 Time [place]: [place] Representative spoke with at [place]: [place] ([place]) Interventions SDOH Screenings   Food Insecurity: No Food Insecurity (04/18/2021)  Housing: Low Risk  (04/18/2021)  Transportation Needs: No Transportation Needs (04/18/2021)  Alcohol Screen: Low Risk  (04/22/2022)  Depression (PHQ2-9): High Risk (04/22/2022)   Financial Resource Strain: Low Risk  (04/18/2021)  Physical Activity: Inactive (04/18/2021)  Social Connections: Socially Integrated (04/18/2021)  Stress: Stress Concern Present (04/18/2021)  Tobacco Use: Low Risk  (05/26/2022)     Readmission Risk Interventions     No data to display

## 2022-05-30 NOTE — TOC Progression Note (Signed)
Transition of Care [facility]) - Progression Note    Patient Details  Name: Yvonne Curtis MRN: [id number] Date of Birth: 1950-05-21  Transition of Care [facility]) CM/SW Contact  Shelbie Hutching, RN Phone Number: 05/30/2022, 12:45 PM  Clinical Narrative:    RNCM spoke with patient's daughter this morning.  Informed her of the official denial for SNF from [facility] [employer].  Also informed her that [employer] will not cover custodial care because she has not had a qualifying observation or admission or outpatient procedure.  Did encourage her to appeal, also encouraged her to call [place] or have her father call.   The patient's husband has decided that he does not want to appeal, he wants to get the patient home.  Hospital bed, hoyer lift, and wheelchair ordered yesterday and will be delivered today.  Once equipment delivered EMS transport can be arranged to get patient home.  [employer] did approve for EMS transport.     Expected Discharge Plan: [place] Barriers to Discharge: Continued Medical Work up  Expected Discharge Plan and Services   Discharge Planning Services: CM Consult Post Acute Care Choice: [place] arrangements for the past 2 months: Single Family Home                 DME Arranged: Hospital bed DME Agency: [facility] Date DME Agency Contacted: 05/29/22 Time DME Agency Contacted: [id number] Representative spoke with at DME Agency: [place]: RN, PT, OT, Nurse's Aide, Social Work [employer] Agency: [facility] [place] Date [place]: 05/29/22 Time [place]: [place] Representative spoke with at [place]: [place] ([place]) Interventions SDOH Screenings   Food Insecurity: No Food Insecurity (04/18/2021)  Housing: Low Risk  (04/18/2021)  Transportation Needs: No Transportation Needs (04/18/2021)  Alcohol Screen: Low Risk  (04/22/2022)  Depression (PHQ2-9): High Risk  (04/22/2022)  Financial Resource Strain: Low Risk  (04/18/2021)  Physical Activity: Inactive (04/18/2021)  Social Connections: Socially Integrated (04/18/2021)  Stress: Stress Concern Present (04/18/2021)  Tobacco Use: Low Risk  (05/26/2022)    Readmission Risk Interventions     No data to display

## 2022-05-30 NOTE — ED Notes (Signed)
Pt cleaned of stool at this time, new brief and linen placed on pt. New purwick in place connected to suction

## 2022-05-30 NOTE — ED Notes (Signed)
Called [facility] for transport to [place]  [phone]

## 2022-05-30 NOTE — ED Provider Notes (Signed)
-----------------------------------------   1:37 PM on 05/30/2022 ----------------------------------------- Social work has been working with the patient.  They were able to arrange for home health as well as equipment to be delivered to the patient's home.  Patient's medical workup if not appear to show any significant abnormality warranting admission to the hospital at this time.  Patient will be discharged home.   Harvest Dark, MD 05/30/22 [phone]

## 2022-05-30 NOTE — ED Provider Notes (Signed)
-----------------------------------------   6:22 AM on 05/30/2022 -----------------------------------------   Blood pressure 130/83, pulse 90, temperature 98 F (36.7 C), temperature source Axillary, resp. rate (!) 21, SpO2 97 %.  The patient is calm and cooperative at this time.  There have been no acute events since the last update.  Awaiting disposition plan from Social Work team.   Paulette Blanch, MD 05/30/22 [phone]

## 2022-06-02 NOTE — Telephone Encounter (Signed)
PTs husband understands it will be virtual/video visit         Copied from [place] [id number]. Topic: General - Other >> Jun 02, 2022 11:45 AM Oley Balm A wrote:  Reason for CRM: Maille Halliwell (husband) is wanting to know what can be done about patients up coming appointment on 06/23/22 at 11am. Per [name] the patient cannot walk and is having issues with her legs.   Please advise.

## 2022-06-03 ENCOUNTER — Telehealth: Payer: Self-pay | Admitting: Family Medicine

## 2022-06-03 ENCOUNTER — Ambulatory Visit: Payer: HMO

## 2022-06-03 ENCOUNTER — Other Ambulatory Visit: Payer: Self-pay

## 2022-06-03 DIAGNOSIS — E441 Mild protein-calorie malnutrition: Secondary | ICD-10-CM

## 2022-06-03 DIAGNOSIS — Z79899 Other long term (current) drug therapy: Secondary | ICD-10-CM | POA: Diagnosis not present

## 2022-06-03 DIAGNOSIS — E1122 Type 2 diabetes mellitus with diabetic chronic kidney disease: Secondary | ICD-10-CM | POA: Diagnosis not present

## 2022-06-03 DIAGNOSIS — Z6837 Body mass index (BMI) 37.0-37.9, adult: Secondary | ICD-10-CM | POA: Diagnosis not present

## 2022-06-03 DIAGNOSIS — I129 Hypertensive chronic kidney disease with stage 1 through stage 4 chronic kidney disease, or unspecified chronic kidney disease: Secondary | ICD-10-CM | POA: Diagnosis not present

## 2022-06-03 DIAGNOSIS — J302 Other seasonal allergic rhinitis: Secondary | ICD-10-CM | POA: Diagnosis not present

## 2022-06-03 DIAGNOSIS — H5203 Hypermetropia, bilateral: Secondary | ICD-10-CM | POA: Diagnosis not present

## 2022-06-03 DIAGNOSIS — N281 Cyst of kidney, acquired: Secondary | ICD-10-CM | POA: Diagnosis not present

## 2022-06-03 DIAGNOSIS — M170 Bilateral primary osteoarthritis of knee: Secondary | ICD-10-CM | POA: Diagnosis not present

## 2022-06-03 DIAGNOSIS — F02B3 Dementia in other diseases classified elsewhere, moderate, with mood disturbance: Secondary | ICD-10-CM

## 2022-06-03 DIAGNOSIS — N183 Chronic kidney disease, stage 3 unspecified: Secondary | ICD-10-CM | POA: Diagnosis not present

## 2022-06-03 DIAGNOSIS — G301 Alzheimer's disease with late onset: Secondary | ICD-10-CM | POA: Diagnosis not present

## 2022-06-03 DIAGNOSIS — E669 Obesity, unspecified: Secondary | ICD-10-CM | POA: Diagnosis not present

## 2022-06-03 DIAGNOSIS — E785 Hyperlipidemia, unspecified: Secondary | ICD-10-CM | POA: Diagnosis not present

## 2022-06-03 DIAGNOSIS — F331 Major depressive disorder, recurrent, moderate: Secondary | ICD-10-CM | POA: Diagnosis not present

## 2022-06-03 DIAGNOSIS — D631 Anemia in chronic kidney disease: Secondary | ICD-10-CM | POA: Diagnosis not present

## 2022-06-03 DIAGNOSIS — F0283 Dementia in other diseases classified elsewhere, unspecified severity, with mood disturbance: Secondary | ICD-10-CM | POA: Diagnosis not present

## 2022-06-03 DIAGNOSIS — Z7409 Other reduced mobility: Secondary | ICD-10-CM

## 2022-06-03 DIAGNOSIS — E7800 Pure hypercholesterolemia, unspecified: Secondary | ICD-10-CM | POA: Diagnosis not present

## 2022-06-03 DIAGNOSIS — R1310 Dysphagia, unspecified: Secondary | ICD-10-CM | POA: Diagnosis not present

## 2022-06-03 DIAGNOSIS — Z9181 History of falling: Secondary | ICD-10-CM | POA: Diagnosis not present

## 2022-06-03 DIAGNOSIS — Z86010 Personal history of colonic polyps: Secondary | ICD-10-CM | POA: Diagnosis not present

## 2022-06-03 DIAGNOSIS — K219 Gastro-esophageal reflux disease without esophagitis: Secondary | ICD-10-CM | POA: Diagnosis not present

## 2022-06-03 NOTE — Telephone Encounter (Signed)
Home Health Verbal Orders - Caller/Agency: patients daughter Yvonne Curtis Number: [phone] Requesting custodial care Frequency: 20hrs a week/ 2hrs in morning and 2hrs in evening

## 2022-06-03 NOTE — Telephone Encounter (Signed)
The daughter called back as she forgot to mention that the patient has a sore bottom and she is requesting cream for bed type sores. She uses  [place], [place] Phone: [phone]  Fax: [phone]     Please assist patient further

## 2022-06-03 NOTE — Telephone Encounter (Signed)
Spoke with husband [name] and he is not able to get her here. States she is not able to walk at all. They had to take her home in the ambulance. Please advise if pt is able to do this appt via video

## 2022-06-04 ENCOUNTER — Other Ambulatory Visit: Payer: Self-pay

## 2022-06-04 ENCOUNTER — Telehealth: Payer: Self-pay | Admitting: Family Medicine

## 2022-06-04 ENCOUNTER — Telehealth (INDEPENDENT_AMBULATORY_CARE_PROVIDER_SITE_OTHER): Payer: HMO | Admitting: Family Medicine

## 2022-06-04 ENCOUNTER — Other Ambulatory Visit: Payer: Self-pay | Admitting: Family Medicine

## 2022-06-04 ENCOUNTER — Encounter: Payer: Self-pay | Admitting: Family Medicine

## 2022-06-04 DIAGNOSIS — Z09 Encounter for follow-up examination after completed treatment for conditions other than malignant neoplasm: Secondary | ICD-10-CM | POA: Diagnosis not present

## 2022-06-04 DIAGNOSIS — G308 Other Alzheimer's disease: Secondary | ICD-10-CM

## 2022-06-04 DIAGNOSIS — F02B3 Dementia in other diseases classified elsewhere, moderate, with mood disturbance: Secondary | ICD-10-CM | POA: Diagnosis not present

## 2022-06-04 DIAGNOSIS — D72829 Elevated white blood cell count, unspecified: Secondary | ICD-10-CM

## 2022-06-04 DIAGNOSIS — L89159 Pressure ulcer of sacral region, unspecified stage: Secondary | ICD-10-CM | POA: Diagnosis not present

## 2022-06-04 DIAGNOSIS — M25462 Effusion, left knee: Secondary | ICD-10-CM

## 2022-06-04 NOTE — Telephone Encounter (Signed)
Called and spoke to husband with sister- Mardene Celeste there. I let them know I spoke to [facility] and they do not come out to homes and do that service since the pandemic. I let them know Dr. Ancil Boozer is really recommending the ER so they can do everything at one facility. Dr. Ancil Boozer would like a synovial fluid culture and repeat CBC. We explained since they have difficulty with transportation going to the ER would reduced multiple facility stops. Sister- Mardene Celeste does not want to go back to [facility] ER and stated she'll call me back when I asked what they wanted to do for the labs.

## 2022-06-04 NOTE — Telephone Encounter (Signed)
Patients sister called in wanting to speak with Larene Beach, about if orders for labs can be done at the medical mall, so patient doesn't have to sit at the ER al long time to have it done. Please call back

## 2022-06-04 NOTE — Telephone Encounter (Signed)
Yvonne Curtis states that the home health care fax machine is broken and is wanting to see if PCP can email the home health orders over to the home health.     Email:  [email or web address]

## 2022-06-04 NOTE — Telephone Encounter (Signed)
Mardene Celeste (patients sister) states that they had a virtual visit with PCP and was told pt need to go to a Orthopedic doctor to have fluid in the patients knee drawn and have it tested.The EMS service from [facility] could not bring the pt to [facility] to have this procedure done. Mardene Celeste states she called [facility] and they do not do this procedure. The EMS called [facility] and per [facility] they might be able to send someone out to the house to drawn the fluid out of the patients knee and have it tested but would need a order sent them from the patients PCP. If [facility] cannot come to the home and order will still need to be sent to make and appointment and EMS can bring her but they need a 24hr notice. The EMS states that they would like to have the appointment set before 12pm since they stay with the patient.   [facility] Number: [phone]   Please call Mardene Celeste to advise: [phone]

## 2022-06-04 NOTE — Addendum Note (Signed)
Addended by: Steele Sizer F on: 06/04/2022 04:34 PM   Modules accepted: Orders

## 2022-06-04 NOTE — Telephone Encounter (Signed)
See 06/04/22 telephone encounter

## 2022-06-04 NOTE — Telephone Encounter (Unsigned)
Copied from [place] [id number]. Topic: General - Other >> Jun 04, 2022  3:11 PM Cyndi Bender wrote: Reason for CRM: Pt asked to speak with [employer]. Pt requests that her call be returned. Cb# [phone]

## 2022-06-04 NOTE — Telephone Encounter (Unsigned)
Copied from Curtis [id number]. Topic: General - Other >> Jun 04, 2022 11:58 AM Leilani Able wrote: Caller on behalf of pt. Yvonne Curtis is wanting to tell Dr Ancil Boozer the details of what they have found out about pt  testing being able to be done in the home, stating it was wanted by Dr Ancil Boozer, fu with Yvonne Curtis at [phone]

## 2022-06-04 NOTE — Telephone Encounter (Signed)
Dr. Ancil Boozer did a virtual visit already and spoke to them about the home health orders.

## 2022-06-04 NOTE — Telephone Encounter (Signed)
See second telephone encounter.

## 2022-06-04 NOTE — Telephone Encounter (Signed)
Patients daughter is calling back wanting to speak with PCP in regards to the conversation they had yesterday about [place] for the patient.   Please advise.

## 2022-06-04 NOTE — Progress Notes (Signed)
Name: Yvonne Curtis   MRN: [id number]    DOB: 1950-12-31   Date:06/04/2022       Progress Note  Subjective  Chief Complaint  Chief Complaint  Patient presents with   ER Follow-up    I connected with  Yvonne Curtis  on 06/04/22 at 10:20 AM EST by a video enabled telemedicine application and verified that I am speaking with the correct person using two identifiers.  I discussed the limitations of evaluation and management by telemedicine and the availability of in person appointments. The patient expressed understanding and agreed to proceed with the virtual visit  Staff also discussed with the patient that there may be a patient responsible charge related to this service. Patient Location: at home  Provider Location: [facility] Additional Individuals present: husband and sister Yvonne Curtis  HPI  05/26/22-05/30/22  Yvonne Curtis has dementia but had a rapid decline of level of activity early January. She was unable to bear weight on left lower leg, difficulty even getting comfortable in bed. She went to [facility] and had WBC of over 17 thousand, synovial fluid had over 2000 WBC and per notes culture was supposed to be sent to lab. They tried getting her on a long term care facility for one month but insurance denied. She was sent home without being able to walk and is still in a lot of pain. Family members said they are unable to even move her in bed and she has a sore on sacral area. Home health was ordered yesterday but they have not received a phone call and are at a loss of what to do at this point.   Medication reconciliation was done   Patient seems to have stayed in [facility] during the 3 days  Reviewed labs, studies with patient and family members  Explained I recommend her to go back to [facility] or repeat arthrocentesis so the synovial fluid can be sent for culture. Even though the notes states possibly inflammatory causes , her white count was very high and giving her prednisone could make  infection worse - if it is in fact an infectious/septic joint   Family  members are unable to get her out of bed and will need to contact EMS for transport   Family is upset that studies were not done while she was at [facility]   Patient relations number was given to the husband this morning   Patient Active Problem List   Diagnosis Date Noted   Impaired functional mobility, balance, gait, and endurance 04/22/2022   Moderate late onset Alzheimer's dementia with mood disturbance ([place]) 12/20/2021   Seasonal allergic rhinitis 12/20/2021   Moderate protein-calorie malnutrition ([place]) 08/20/2021   Moderate episode of recurrent major depressive disorder ([place]) 08/20/2021   Cyst of kidney, acquired 07/04/2021   Anemia in chronic kidney disease 02/07/2021   Pure hypercholesterolemia, unspecified 02/07/2021   Hypercalcemia 10/11/2020   Chronic kidney disease, stage 3b ([place]) 09/04/2020   Dysphagia    History of colonic polyps    Polyp of transverse colon    Abnormal sense of taste 12/19/2019   Loss of memory 12/19/2019   Mood changes 12/19/2019   Acanthosis nigricans 11/14/2014   Benign hypertension 11/14/2014   Dyslipidemia 11/14/2014   Gastro-esophageal reflux disease without esophagitis 11/14/2014   Morbid obesity ([place]) 11/14/2014   Primary osteoarthritis of both knees 11/14/2014   Microalbuminuria 10/15/2012   Controlled type 2 diabetes mellitus with stage 3 chronic kidney disease, without long-term current use of  insulin ([place]) 06/05/2009   Body dermatophytosis 12/13/2008    Past Surgical History:  Procedure Laterality Date   CHOLECYSTECTOMY     COLONOSCOPY WITH PROPOFOL N/A 11/30/2014   Procedure: COLONOSCOPY WITH PROPOFOL;  Surgeon: Lucilla Lame, MD;  Location: [place];  Service: Endoscopy;  Laterality: N/A;  WITH BIOPSY-- TRANSVERSE COLON POLYP  X 2 DESCENING COLON POLYP   COLONOSCOPY WITH PROPOFOL N/A 01/17/2020   Procedure: COLONOSCOPY WITH PROPOFOL;  Surgeon: Lucilla Lame, MD;  Location: [facility] END[facility]COPY;  Service: Endoscopy;  Laterality: N/A;   ESOPHAGOGASTRODUODENOSCOPY (EGD) WITH PROPOFOL N/A 08/23/2020   Procedure: ESOPHAGOGASTRODUODENOSCOPY (EGD) WITH PROPOFOL;  Surgeon: Lucilla Lame, MD;  Location: [facility] ENDOSCOPY;  Service: Endoscopy;  Laterality: N/A;   GALLBLADDER SURGERY     SEPTOPLASTY     tonsillectomy     TONSILLECTOMY     TUBAL LIGATION      Family History  Problem Relation Age of Onset   Colon cancer Mother    Lung cancer Father    Hypertension Sister    Hypertension Sister    Breast cancer Neg Hx     Social History   Socioeconomic History   Marital status: Married    Spouse name: Yvonne Curtis    Number of children: 2   Years of education: Not on file   Highest education level: 12th grade  Occupational History   Occupation: retired  Tobacco Use   Smoking status: Never   Smokeless tobacco: Never  Vaping Use   Vaping Use: Never used  Substance and Sexual Activity   Alcohol use: No    Alcohol/week: 0.0 standard drinks of alcohol   Drug use: No   Sexual activity: Yes    Partners: Male  Other Topics Concern   Not on file  Social History Narrative   Retired from [employer] , custodian   Husband also retired, but is back to work.    She dances for physical activity   She has two grandchildren, one is in college   Social Determinants of Health   Financial Resource Strain: Low Risk  (04/18/2021)   Overall Financial Resource Strain (CARDIA)    Difficulty of Paying Living Expenses: Not hard at all  Food Insecurity: No Food Insecurity (04/18/2021)   Hunger Vital Sign    Worried About Running Out of Food in the Last Year: Never true    Ran Out of Food in the Last Year: Never true  Transportation Needs: No Transportation Needs (04/18/2021)   PRAPARE - Hydrologist (Medical): No    Lack of Transportation (Non-Medical): No  Physical Activity: Inactive (04/18/2021)   Exercise Vital Sign    Days  of Exercise per Week: 0 days    Minutes of Exercise per Session: 0 min  Stress: Stress Concern Present (04/18/2021)   [place]    Feeling of Stress : To some extent  Social Connections: Socially Integrated (04/18/2021)   Social Connection and Isolation Panel [NHANES]    Frequency of Communication with Friends and Family: More than three times a week    Frequency of Social Gatherings with Friends and Family: More than three times a week    Attends Religious Services: More than 4 times per year    Active Member of [employer] or Organizations: Yes    Attends Music therapist: More than 4 times per year    Marital Status: Married  Human resources officer Violence: Not At Risk (  04/18/2021)   Humiliation, Afraid, Rape, and Kick questionnaire    Fear of Current or Ex-Partner: No    Emotionally Abused: No    Physically Abused: No    Sexually Abused: No     Current Outpatient Medications:    amLODipine (NORVASC) 2.5 MG tablet, Take 1 tablet by mouth once daily, Disp: 90 tablet, Rfl: 0   blood glucose meter kit and supplies, Dispense based on patient and insurance preference. Use once daily as directed. (FOR ICD-10 E10.9, E11.9). STrips 100 Lancets 100, Disp: 1 each, Rfl: 0   cetirizine HCl (ZYRTEC) 5 MG/5ML SOLN, Take 10 mLs (10 mg total) by mouth daily as needed for allergies., Disp: 300 mL, Rfl: 2   divalproex (DEPAKOTE SPRINKLES) 125 MG capsule, Take 1 capsule (125 mg total) by mouth 2 (two) times daily., Disp: 180 capsule, Rfl: 1   mirtazapine (REMERON SOL-TAB) 15 MG disintegrating tablet, Take 1 tablet (15 mg total) by mouth at bedtime., Disp: 90 tablet, Rfl: 1   rivastigmine (EXELON) 4.6 mg/24hr, Place 4.6 mg onto the skin daily., Disp: , Rfl:    telmisartan (MICARDIS) 80 MG tablet, Take 1 tablet (80 mg total) by mouth daily., Disp: 90 tablet, Rfl: 0  Allergies  Allergen Reactions   Oxycodone Other (See Comments)    Oxycodone Hcl     insomnia, agitation    I personally reviewed active problem list, medication list, allergies, family history, social history, health maintenance with the patient/caregiver today.   ROS  Ten systems reviewed and is negative except as mentioned in HPI   Objective  Virtual encounter, vitals not obtained.  There is no height or weight on file to calculate BMI.  Physical Exam  Somnolent, opened eyes and said hello, she said yes for pain but could not tell me where, she grimaced and yelled when husband touched her left knee and when they tried to move her legs   PHQ2/9:    04/22/2022    8:18 AM 04/03/2022    2:46 PM 12/30/2021   11:03 AM 12/20/2021    9:25 AM 10/24/2021   10:53 AM  Depression screen PHQ 2/9  Decreased Interest 3 0 0 0 0  Down, Depressed, Hopeless 3 0 0 0 0  PHQ - 2 Score 6 0 0 0 0  Altered sleeping 3 0 0 0   Tired, decreased energy 3 0 0 0   Change in appetite 0 0 0 0   Feeling bad or failure about yourself  0 0 0 0   Trouble concentrating 0 0 0 0   Moving slowly or fidgety/restless 0 0 0 0   Suicidal thoughts 0 0 0 0   PHQ-9 Score 12 0 0 0   Difficult doing work/chores Somewhat difficult Not difficult at all Not difficult at all     PHQ-2/9 unable to do it  Fall Risk:    06/04/2022    9:54 AM 04/22/2022    8:18 AM 04/03/2022    2:38 PM 12/30/2021   11:03 AM 12/20/2021    9:25 AM  Fall Risk   Falls in the past year? Exclusion - non ambulatory '1 1 1 1  '$ Number falls in past yr:  0 0 1 1  Injury with Fall?  0 0 1 0  Risk for fall due to :  Impaired balance/gait  Impaired balance/gait;Impaired mobility History of fall(s);Impaired balance/gait;Mental status change  Follow up  Falls prevention discussed;Education provided;Falls evaluation completed  Falls prevention discussed;Education provided Falls prevention  discussed;Education provided;Falls evaluation completed     Assessment & Plan  1. Hospital discharge follow-up  Unfortunately  patient is still in severe pain and we are not sure if septic or inflammatory arthritis since culture of synovial fluid was not ordered   2. Knee effusion, left   3. Pressure injury of skin of sacral region, unspecified injury stage  We will need to wait for home health to access it. In the mean time discussed rotating her but it causes too much pain   4. Leukocytosis, unspecified type  Needs to have repeat CBC  Family members will contact us when they decide which facility patient will go to    I discussed the assessment and treatment plan with the patient. The patient was provided an opportunity to ask questions and all were answered. The patient agreed with the plan and demonstrated an understanding of the instructions.  The patient was advised to call back or seek an in-person evaluation if the symptoms worsen or if the condition fails to improve as anticipated.  I provided 25  minutes of non-face-to-face time during this encounter.

## 2022-06-05 ENCOUNTER — Ambulatory Visit: Payer: HMO

## 2022-06-05 ENCOUNTER — Other Ambulatory Visit: Payer: Self-pay | Admitting: Family Medicine

## 2022-06-05 ENCOUNTER — Telehealth: Payer: Self-pay | Admitting: Family Medicine

## 2022-06-05 DIAGNOSIS — M25462 Effusion, left knee: Secondary | ICD-10-CM

## 2022-06-05 DIAGNOSIS — D72829 Elevated white blood cell count, unspecified: Secondary | ICD-10-CM

## 2022-06-05 DIAGNOSIS — I10 Essential (primary) hypertension: Secondary | ICD-10-CM

## 2022-06-05 NOTE — Telephone Encounter (Signed)
Copied from [place] [id number]. Topic: General - Other >> Jun 05, 2022 11:21 AM Cyndi Bender wrote: Reason for CRM: Pt daughter Tobie Poet requests that Dr.Sowles return her call because she has some questions and concerns. Cb# [phone]

## 2022-06-06 DIAGNOSIS — I10 Essential (primary) hypertension: Secondary | ICD-10-CM | POA: Diagnosis not present

## 2022-06-06 DIAGNOSIS — M1712 Unilateral primary osteoarthritis, left knee: Secondary | ICD-10-CM | POA: Diagnosis not present

## 2022-06-06 DIAGNOSIS — M25562 Pain in left knee: Secondary | ICD-10-CM | POA: Diagnosis not present

## 2022-06-06 DIAGNOSIS — M79672 Pain in left foot: Secondary | ICD-10-CM | POA: Diagnosis not present

## 2022-06-06 DIAGNOSIS — Z743 Need for continuous supervision: Secondary | ICD-10-CM | POA: Diagnosis not present

## 2022-06-06 DIAGNOSIS — D72829 Elevated white blood cell count, unspecified: Secondary | ICD-10-CM | POA: Diagnosis not present

## 2022-06-06 NOTE — Telephone Encounter (Signed)
Home Health Verbal Orders - Caller/Agency: patients daughter [name] Patients daughter calling in about custodial to add with this previous message  Callback Number: [phone] Requesting  custodial care Frequency: ?

## 2022-06-06 NOTE — Telephone Encounter (Signed)
Dr. Ancil Boozer already spoke to husband and sister- Mardene Celeste, and she did a Chronic Care Management referral. Dr. Ancil Boozer explained we do not have a "Custodial Care" referral. Husband and sister- Mardene Celeste gave her verbal understanding.

## 2022-06-10 ENCOUNTER — Telehealth: Payer: Self-pay | Admitting: Family Medicine

## 2022-06-10 ENCOUNTER — Ambulatory Visit: Payer: HMO

## 2022-06-10 DIAGNOSIS — H5203 Hypermetropia, bilateral: Secondary | ICD-10-CM | POA: Diagnosis not present

## 2022-06-10 DIAGNOSIS — F331 Major depressive disorder, recurrent, moderate: Secondary | ICD-10-CM | POA: Diagnosis not present

## 2022-06-10 DIAGNOSIS — E1122 Type 2 diabetes mellitus with diabetic chronic kidney disease: Secondary | ICD-10-CM | POA: Diagnosis not present

## 2022-06-10 DIAGNOSIS — J302 Other seasonal allergic rhinitis: Secondary | ICD-10-CM | POA: Diagnosis not present

## 2022-06-10 DIAGNOSIS — E785 Hyperlipidemia, unspecified: Secondary | ICD-10-CM | POA: Diagnosis not present

## 2022-06-10 DIAGNOSIS — Z6837 Body mass index (BMI) 37.0-37.9, adult: Secondary | ICD-10-CM | POA: Diagnosis not present

## 2022-06-10 DIAGNOSIS — M170 Bilateral primary osteoarthritis of knee: Secondary | ICD-10-CM | POA: Diagnosis not present

## 2022-06-10 DIAGNOSIS — Z86010 Personal history of colonic polyps: Secondary | ICD-10-CM | POA: Diagnosis not present

## 2022-06-10 DIAGNOSIS — E669 Obesity, unspecified: Secondary | ICD-10-CM | POA: Diagnosis not present

## 2022-06-10 DIAGNOSIS — G301 Alzheimer's disease with late onset: Secondary | ICD-10-CM | POA: Diagnosis not present

## 2022-06-10 DIAGNOSIS — Z79899 Other long term (current) drug therapy: Secondary | ICD-10-CM | POA: Diagnosis not present

## 2022-06-10 DIAGNOSIS — I129 Hypertensive chronic kidney disease with stage 1 through stage 4 chronic kidney disease, or unspecified chronic kidney disease: Secondary | ICD-10-CM | POA: Diagnosis not present

## 2022-06-10 DIAGNOSIS — R1310 Dysphagia, unspecified: Secondary | ICD-10-CM | POA: Diagnosis not present

## 2022-06-10 DIAGNOSIS — D631 Anemia in chronic kidney disease: Secondary | ICD-10-CM | POA: Diagnosis not present

## 2022-06-10 DIAGNOSIS — Z9181 History of falling: Secondary | ICD-10-CM | POA: Diagnosis not present

## 2022-06-10 DIAGNOSIS — N183 Chronic kidney disease, stage 3 unspecified: Secondary | ICD-10-CM | POA: Diagnosis not present

## 2022-06-10 DIAGNOSIS — F0283 Dementia in other diseases classified elsewhere, unspecified severity, with mood disturbance: Secondary | ICD-10-CM | POA: Diagnosis not present

## 2022-06-10 DIAGNOSIS — N281 Cyst of kidney, acquired: Secondary | ICD-10-CM | POA: Diagnosis not present

## 2022-06-10 DIAGNOSIS — K219 Gastro-esophageal reflux disease without esophagitis: Secondary | ICD-10-CM | POA: Diagnosis not present

## 2022-06-10 DIAGNOSIS — E7800 Pure hypercholesterolemia, unspecified: Secondary | ICD-10-CM | POA: Diagnosis not present

## 2022-06-10 NOTE — Telephone Encounter (Signed)
Please return daughter call Tobie Poet [phone]

## 2022-06-11 ENCOUNTER — Telehealth: Payer: Self-pay | Admitting: Emergency Medicine

## 2022-06-11 NOTE — Telephone Encounter (Signed)
Patients daughter called and stated she need orders for custodial care that will help with bathing, and changing her. [name] need help in the mornings until they can get there in the afternoon. A list was dropped at the office on yesterday that is in network with insurance. Cannot afford to pay out of pocket.  Only people that been out to see her is physical therapy.   Please call Tobie Poet

## 2022-06-12 ENCOUNTER — Telehealth: Payer: Self-pay | Admitting: Family Medicine

## 2022-06-12 ENCOUNTER — Ambulatory Visit: Payer: HMO

## 2022-06-12 DIAGNOSIS — M6281 Muscle weakness (generalized): Secondary | ICD-10-CM | POA: Diagnosis not present

## 2022-06-12 NOTE — Telephone Encounter (Signed)
Copied from [place]. Topic: Quick Communication - Home Health Verbal Orders >> Jun 12, 2022  8:47 AM Leilani Able wrote: Caller/Agency: [facility] [phone] Katina Degree Number: [phone] Requesting OT/Therapy: Pt husband refusing therapy Frequency: Pt husband refusing therapy

## 2022-06-13 ENCOUNTER — Telehealth: Payer: Self-pay

## 2022-06-13 ENCOUNTER — Telehealth: Payer: Self-pay | Admitting: Family Medicine

## 2022-06-13 ENCOUNTER — Other Ambulatory Visit: Payer: Self-pay

## 2022-06-13 DIAGNOSIS — F02B3 Dementia in other diseases classified elsewhere, moderate, with mood disturbance: Secondary | ICD-10-CM

## 2022-06-13 DIAGNOSIS — M1712 Unilateral primary osteoarthritis, left knee: Secondary | ICD-10-CM | POA: Diagnosis not present

## 2022-06-13 DIAGNOSIS — Z7409 Other reduced mobility: Secondary | ICD-10-CM

## 2022-06-13 DIAGNOSIS — L89159 Pressure ulcer of sacral region, unspecified stage: Secondary | ICD-10-CM | POA: Diagnosis not present

## 2022-06-13 DIAGNOSIS — Z743 Need for continuous supervision: Secondary | ICD-10-CM | POA: Diagnosis not present

## 2022-06-13 DIAGNOSIS — R404 Transient alteration of awareness: Secondary | ICD-10-CM | POA: Diagnosis not present

## 2022-06-13 DIAGNOSIS — E441 Mild protein-calorie malnutrition: Secondary | ICD-10-CM

## 2022-06-13 DIAGNOSIS — R531 Weakness: Secondary | ICD-10-CM | POA: Diagnosis not present

## 2022-06-13 DIAGNOSIS — W19XXXA Unspecified fall, initial encounter: Secondary | ICD-10-CM | POA: Diagnosis not present

## 2022-06-13 DIAGNOSIS — G308 Other Alzheimer's disease: Secondary | ICD-10-CM

## 2022-06-13 DIAGNOSIS — F0393 Unspecified dementia, unspecified severity, with mood disturbance: Secondary | ICD-10-CM | POA: Diagnosis not present

## 2022-06-13 NOTE — Telephone Encounter (Signed)
Care Management   Visit Note  06/13/2022 Name: IGNACIO SHULAR MRN: [id number] DOB: 02/03/1951  Subjective: Yvonne Curtis is a 72 y.o. year old female who is a primary care patient of Alba Cory, MD. The Care Management team was consulted for assistance.      Engaged with patient's daughter Maxine Glenn by telephone.

## 2022-06-13 NOTE — Telephone Encounter (Signed)
Returned call to Nou, she was unavailable and I left a detailed voicemail giving her verbal ok for nursing/home health aide eval and treat for open bedsores per Dr. Ancil Boozer. I left our number for her to callback with confirmation or to obtain any other request needed. We also placed a new referral in EPIC today for HH/Aide.

## 2022-06-13 NOTE — Telephone Encounter (Signed)
Called and gave verbal

## 2022-06-13 NOTE — Telephone Encounter (Signed)
[facility] therapist called needing a verbal to close their order. Pt's husband says the pt is too weak for services.   Please leave order on secured work Academic librarian: [phone]

## 2022-06-13 NOTE — Telephone Encounter (Signed)
Copied from [place] [id number]. Topic: General - Other >> Jun 13, 2022  9:37 AM [place] B wrote: Reason for CRM: Nou from [facility] ask to speak with Gwynn Burly about what home services paitient is available for. She states they dont do custodial but looking into what else is available. Please call back. She says Ms Armanda Heritage was going to reach [facility] to [facility].

## 2022-06-13 NOTE — Telephone Encounter (Signed)
Copied from [place] [phone]. Topic: General - Other >> Jun 13, 2022  3:43 PM Santiya F wrote: Reason for CRM: Nou from [facility] calling because she spoke with patients daughter and was told ms Green has an open sore on her bottom and the doctor ordered pt orders but the husband declined them. Nou wanted to know Dr. Ancil Boozer opinion on getting home health nursing for patient. Please follow up with Nou [phone].

## 2022-06-14 ENCOUNTER — Other Ambulatory Visit: Payer: Self-pay | Admitting: Family Medicine

## 2022-06-14 DIAGNOSIS — F02B3 Alzheimer's disease with late onset: Secondary | ICD-10-CM

## 2022-06-16 ENCOUNTER — Telehealth: Payer: Self-pay | Admitting: Family Medicine

## 2022-06-16 ENCOUNTER — Telehealth: Payer: Self-pay

## 2022-06-16 ENCOUNTER — Other Ambulatory Visit: Payer: Self-pay

## 2022-06-16 DIAGNOSIS — F02B3 Dementia in other diseases classified elsewhere, moderate, with mood disturbance: Secondary | ICD-10-CM

## 2022-06-16 NOTE — Telephone Encounter (Signed)
Home Health Verbal Orders - Caller/Agency: [place] Number: [phone] Requesting OT/PT/Skilled Nursing/Social Work/Speech Therapy:   Yvonne Curtis is needing orders for pt she has a pressure wound on her bottom.  Please advise

## 2022-06-16 NOTE — Telephone Encounter (Signed)
Verbal order given per Dr. Ancil Boozer. Soni stated a RN would be out to the residence tomorrow or Wednesday at the latest to evaluate.

## 2022-06-17 ENCOUNTER — Telehealth: Payer: Self-pay

## 2022-06-17 ENCOUNTER — Ambulatory Visit: Payer: HMO

## 2022-06-18 NOTE — Progress Notes (Signed)
Name: Yvonne Curtis   MRN: [id number]    DOB: [birth year]   Date:06/19/2022       Progress Note  Subjective  Chief Complaint  Follow Up  I connected with  [name]  on 06/19/22 at 10:40 AM EST by a video enabled telemedicine application and verified that I am speaking with the correct person using two identifiers.  I discussed the limitations of evaluation and management by telemedicine and the availability of in person appointments. The patient expressed understanding and agreed to proceed with the virtual visit  Staff also discussed with the patient that there may be a patient responsible charge related to this service. Patient Location: at home  Provider Location: [facility] Additional Individuals present: Mardene Celeste ( her sister ) and her husband  HPI  Her sister Mardene Celeste answered the video call, she told me we are not doing our jobs and she is very disappointed in the care we have been providing to her sister, she said the services from home health have not been initiated and it is my fault, she threatened to report me to the medical board. I explained to her that we are doing everything we can to coordinate her care and keep her at home, explain that we have a Education officer, museum and nurse and referral coordinator and both of my CMA's working to get her all the services on a daily basis. I also explained that if Mrs. Or Mr. Setaro are not satisfied with our care we can give them a few other physicians in the area and they can transfer their care to another provider. At that point Mardene Celeste handed the phone to Mr. Hinsey and he said he does not want to transfer her care. I explained to Mr. Felici that Mardene Celeste is no longer allowed to contact our office since she has not been respectful to our staff or to myself. He said he understood  DMII with renal manifestation: she has been off Metformin and  A1C  has been within normal limits, last level was done 12/2021 and it was 5.8 .  She denies  polyphagia, polyuria or polydipsia. She was taking ARB but stopped swallowing all medications last Fall, she is now taking Micardis and amlodipine again as long as it is crushed.  MDD: since hospital stay she has not been mobile, lays in bed and seems to be in bed, not responding to all her verbal commands - like when I asked her to raise her arms. Likely progression of dementia   Morbid obesity BMI above 35 with co-morbidities such as DM, HTN , CKI . Unsure of her current weight at this time, husband thinks she may have lost weight since she is not eating much, her food has to be purred, not eating solid foods   CKI stage III: seen by Dr. Lanora Manis - nephrologist in the past, last GFR was done at [facility] 05/2022 and it was 72   Alzheimer's dementia with mood changes: seen by Dr. Melrose Nakayama neurologist and had a visit at [facility] for second opinion 09/22. She was taking Namenda, Aricept, Remeron and  seroquel, however stopped taking all medications due to refusing to swallow, it was switched to exelon patch end of 2023 but cost went up and she is now taking the oral formulation again, remerol sol-tabs and depakote sprinkles , but in the sprinkles form.  She is getting less responsive, spends most of her time in bed and is developing bed sores ( waiting for home health  to go to their house) she gets agitated at times, only eating pureed food    Patient Active Problem List   Diagnosis Date Noted   Impaired functional mobility, balance, gait, and endurance 04/22/2022   Moderate late onset Alzheimer's dementia with mood disturbance ([place]) 12/20/2021   Seasonal allergic rhinitis 12/20/2021   Moderate protein-calorie malnutrition ([place]) 08/20/2021   Moderate episode of recurrent major depressive disorder ([place]) 08/20/2021   Cyst of kidney, acquired 07/04/2021   Anemia in chronic kidney disease 02/07/2021   Pure hypercholesterolemia, unspecified 02/07/2021   Hypercalcemia 10/11/2020   Chronic kidney disease, stage  3b ([place]) 09/04/2020   Dysphagia    History of colonic polyps    Polyp of transverse colon    Abnormal sense of taste 12/19/2019   Loss of memory 12/19/2019   Mood changes 12/19/2019   Acanthosis nigricans 11/14/2014   Benign hypertension 11/14/2014   Dyslipidemia 11/14/2014   Gastro-esophageal reflux disease without esophagitis 11/14/2014   Morbid obesity ([place]) 11/14/2014   Primary osteoarthritis of both knees 11/14/2014   Microalbuminuria 10/15/2012   Controlled type 2 diabetes mellitus with stage 3 chronic kidney disease, without long-term current use of insulin ([place]) 06/05/2009   Body dermatophytosis 12/13/2008    Past Surgical History:  Procedure Laterality Date   CHOLECYSTECTOMY     COLONOSCOPY WITH PROPOFOL N/A 11/30/2014   Procedure: COLONOSCOPY WITH PROPOFOL;  Surgeon: Lucilla Lame, MD;  Location: [place];  Service: Endoscopy;  Laterality: N/A;  WITH BIOPSY-- TRANSVERSE COLON POLYP  X 2 DESCENING COLON POLYP   COLONOSCOPY WITH PROPOFOL N/A 01/17/2020   Procedure: COLONOSCOPY WITH PROPOFOL;  Surgeon: Lucilla Lame, MD;  Location: [facility] ENDOSCOPY;  Service: Endoscopy;  Laterality: N/A;   ESOPHAGOGASTRODUODENOSCOPY (EGD) WITH PROPOFOL N/A 08/23/2020   Procedure: ESOPHAGOGASTRODUODENOSCOPY (EGD) WITH PROPOFOL;  Surgeon: Lucilla Lame, MD;  Location: [facility] ENDOSCOPY;  Service: Endoscopy;  Laterality: N/A;   GALLBLADDER SURGERY     SEPTOPLASTY     tonsillectomy     TONSILLECTOMY     TUBAL LIGATION      Family History  Problem Relation Age of Onset   Colon cancer Mother    Lung cancer Father    Hypertension Sister    Hypertension Sister    Breast cancer Neg Hx     Social History   Socioeconomic History   Marital status: Married    Spouse name: [name]    Number of children: 2   Years of education: Not on file   Highest education level: 12th grade  Occupational History   Occupation: retired  Tobacco Use   Smoking status: Never   Smokeless tobacco: Never   Vaping Use   Vaping Use: Never used  Substance and Sexual Activity   Alcohol use: No    Alcohol/week: 0.0 standard drinks of alcohol   Drug use: No   Sexual activity: Yes    Partners: Male  Other Topics Concern   Not on file  Social History Narrative   Retired from [employer] , custodian   Husband also retired, but is back to work.    She dances for physical activity   She has two grandchildren, one is in college   Social Determinants of Health   Financial Resource Strain: Low Risk  (04/18/2021)   Overall Financial Resource Strain (CARDIA)    Difficulty of Paying Living Expenses: Not hard at all  Food Insecurity: No Food Insecurity (04/18/2021)   Hunger Vital Sign    Worried About Running Out of Food  in the Last Year: Never true    [place] in the Last Year: Never true  Transportation Needs: No Transportation Needs (04/18/2021)   PRAPARE - Hydrologist (Medical): No    Lack of Transportation (Non-Medical): No  Physical Activity: Inactive (04/18/2021)   Exercise Vital Sign    Days of Exercise per Week: 0 days    Minutes of Exercise per Session: 0 min  Stress: Stress Concern Present (04/18/2021)   [place]    Feeling of Stress : To some extent  Social Connections: Socially Integrated (04/18/2021)   Social Connection and Isolation Panel [NHANES]    Frequency of Communication with Friends and Family: More than three times a week    Frequency of Social Gatherings with Friends and Family: More than three times a week    Attends Religious Services: More than 4 times per year    Active Member of [employer] or Organizations: Yes    Attends Music therapist: More than 4 times per year    Marital Status: Married  Human resources officer Violence: Not At Risk (04/18/2021)   Humiliation, Afraid, Rape, and Kick questionnaire    Fear of Current or Ex-Partner: No     Emotionally Abused: No    Physically Abused: No    Sexually Abused: No     Current Outpatient Medications:    amLODipine (NORVASC) 2.5 MG tablet, Take 1 tablet by mouth once daily, Disp: 90 tablet, Rfl: 0   blood glucose meter kit and supplies, Dispense based on patient and insurance preference. Use once daily as directed. (FOR ICD-10 E10.9, E11.9). STrips 100 Lancets 100, Disp: 1 each, Rfl: 0   cetirizine HCl (ZYRTEC) 5 MG/5ML SOLN, Take 10 mLs (10 mg total) by mouth daily as needed for allergies., Disp: 300 mL, Rfl: 2   divalproex (DEPAKOTE SPRINKLE) 125 MG capsule, Take 1 capsule by mouth twice daily, Disp: 180 capsule, Rfl: 0   mirtazapine (REMERON SOL-TAB) 15 MG disintegrating tablet, Take 1 tablet (15 mg total) by mouth at bedtime., Disp: 90 tablet, Rfl: 1   rivastigmine (EXELON) 4.6 mg/24hr, Place 4.6 mg onto the skin daily., Disp: , Rfl:    telmisartan (MICARDIS) 80 MG tablet, Take 1 tablet (80 mg total) by mouth daily., Disp: 90 tablet, Rfl: 0  Allergies  Allergen Reactions   Oxycodone Other (See Comments)   Oxycodone Hcl     insomnia, agitation    I personally reviewed active problem list, medication list, allergies, family history, social history, health maintenance with the patient/caregiver today.   ROS  Unable to do , patient opened eyes but did not talk to me - information was given by her husband  Objective  Virtual encounter, vitals not obtained.  There is no height or weight on file to calculate BMI.  Physical Exam  Laying in bed, opened eyes, but did not follow simple commands   PHQ2/9:    04/22/2022    8:18 AM 04/03/2022    2:46 PM 12/30/2021   11:03 AM 12/20/2021    9:25 AM 10/24/2021   10:53 AM  Depression screen PHQ 2/9  Decreased Interest 3 0 0 0 0  Down, Depressed, Hopeless 3 0 0 0 0  PHQ - 2 Score 6 0 0 0 0  Altered sleeping 3 0 0 0   Tired, decreased energy 3 0 0 0   Change in appetite 0 0 0 0  Feeling bad or failure about yourself  0 0 0  0   Trouble concentrating 0 0 0 0   Moving slowly or fidgety/restless 0 0 0 0   Suicidal thoughts 0 0 0 0   PHQ-9 Score 12 0 0 0   Difficult doing work/chores Somewhat difficult Not difficult at all Not difficult at all     PHQ-2/9 Result is not valid since filled by caregiver .    Fall Risk:    06/19/2022    9:59 AM 06/04/2022    9:54 AM 04/22/2022    8:18 AM 04/03/2022    2:38 PM 12/30/2021   11:03 AM  Fall Risk   Falls in the past year? Exclusion - non ambulatory Exclusion - non ambulatory 1 1 1  $ Number falls in past yr:   0 0 1  Injury with Fall?   0 0 1  Risk for fall due to :   Impaired balance/gait  Impaired balance/gait;Impaired mobility  Follow up   Falls prevention discussed;Education provided;Falls evaluation completed  Falls prevention discussed;Education provided     Assessment & Plan  1. Moderate late onset Alzheimer's dementia with mood disturbance ([place])  Under the care of neurologist, symptoms are progressing but family is not ready to transition to nursing home/memory care  2. Morbid obesity ([place])  Currently more worried about keeping her calories intake in the normal range  3. Moderate episode of recurrent major depressive disorder ([place])  On medication   4. Chronic kidney disease, stage 3b ([place])  Reviewed last labs  5. Controlled type 2 diabetes mellitus with stage 3 chronic kidney disease, without long-term current use of insulin ([place])    I discussed the assessment and treatment plan with the patient. The patient was provided an opportunity to ask questions and all were answered. The patient agreed with the plan and demonstrated an understanding of the instructions.  The patient was advised to call back or seek an in-person evaluation if the symptoms worsen or if the condition fails to improve as anticipated.  I provided 25  minutes of non-face-to-face time during this encounter.

## 2022-06-19 ENCOUNTER — Other Ambulatory Visit: Payer: Self-pay | Admitting: Family Medicine

## 2022-06-19 ENCOUNTER — Encounter: Payer: Self-pay | Admitting: Family Medicine

## 2022-06-19 ENCOUNTER — Ambulatory Visit: Payer: HMO

## 2022-06-19 ENCOUNTER — Telehealth: Payer: Self-pay

## 2022-06-19 ENCOUNTER — Telehealth (INDEPENDENT_AMBULATORY_CARE_PROVIDER_SITE_OTHER): Payer: HMO | Admitting: Family Medicine

## 2022-06-19 DIAGNOSIS — D631 Anemia in chronic kidney disease: Secondary | ICD-10-CM | POA: Diagnosis not present

## 2022-06-19 DIAGNOSIS — Z6837 Body mass index (BMI) 37.0-37.9, adult: Secondary | ICD-10-CM | POA: Diagnosis not present

## 2022-06-19 DIAGNOSIS — F02B3 Dementia in other diseases classified elsewhere, moderate, with mood disturbance: Secondary | ICD-10-CM | POA: Diagnosis not present

## 2022-06-19 DIAGNOSIS — E1122 Type 2 diabetes mellitus with diabetic chronic kidney disease: Secondary | ICD-10-CM | POA: Diagnosis not present

## 2022-06-19 DIAGNOSIS — J302 Other seasonal allergic rhinitis: Secondary | ICD-10-CM | POA: Diagnosis not present

## 2022-06-19 DIAGNOSIS — N1832 Chronic kidney disease, stage 3b: Secondary | ICD-10-CM | POA: Diagnosis not present

## 2022-06-19 DIAGNOSIS — I10 Essential (primary) hypertension: Secondary | ICD-10-CM

## 2022-06-19 DIAGNOSIS — G301 Alzheimer's disease with late onset: Secondary | ICD-10-CM | POA: Diagnosis not present

## 2022-06-19 DIAGNOSIS — E876 Hypokalemia: Secondary | ICD-10-CM

## 2022-06-19 DIAGNOSIS — F0283 Dementia in other diseases classified elsewhere, unspecified severity, with mood disturbance: Secondary | ICD-10-CM | POA: Diagnosis not present

## 2022-06-19 DIAGNOSIS — N183 Chronic kidney disease, stage 3 unspecified: Secondary | ICD-10-CM

## 2022-06-19 DIAGNOSIS — I129 Hypertensive chronic kidney disease with stage 1 through stage 4 chronic kidney disease, or unspecified chronic kidney disease: Secondary | ICD-10-CM | POA: Diagnosis not present

## 2022-06-19 DIAGNOSIS — M170 Bilateral primary osteoarthritis of knee: Secondary | ICD-10-CM | POA: Diagnosis not present

## 2022-06-19 DIAGNOSIS — H5203 Hypermetropia, bilateral: Secondary | ICD-10-CM | POA: Diagnosis not present

## 2022-06-19 DIAGNOSIS — D72829 Elevated white blood cell count, unspecified: Secondary | ICD-10-CM

## 2022-06-19 DIAGNOSIS — E669 Obesity, unspecified: Secondary | ICD-10-CM | POA: Diagnosis not present

## 2022-06-19 DIAGNOSIS — Z9181 History of falling: Secondary | ICD-10-CM | POA: Diagnosis not present

## 2022-06-19 DIAGNOSIS — Z79899 Other long term (current) drug therapy: Secondary | ICD-10-CM

## 2022-06-19 DIAGNOSIS — F331 Major depressive disorder, recurrent, moderate: Secondary | ICD-10-CM

## 2022-06-19 DIAGNOSIS — Z86010 Personal history of colonic polyps: Secondary | ICD-10-CM | POA: Diagnosis not present

## 2022-06-19 DIAGNOSIS — E785 Hyperlipidemia, unspecified: Secondary | ICD-10-CM | POA: Diagnosis not present

## 2022-06-19 DIAGNOSIS — K219 Gastro-esophageal reflux disease without esophagitis: Secondary | ICD-10-CM | POA: Diagnosis not present

## 2022-06-19 DIAGNOSIS — R1310 Dysphagia, unspecified: Secondary | ICD-10-CM | POA: Diagnosis not present

## 2022-06-19 DIAGNOSIS — N281 Cyst of kidney, acquired: Secondary | ICD-10-CM | POA: Diagnosis not present

## 2022-06-19 DIAGNOSIS — E7800 Pure hypercholesterolemia, unspecified: Secondary | ICD-10-CM | POA: Diagnosis not present

## 2022-06-19 DIAGNOSIS — E441 Mild protein-calorie malnutrition: Secondary | ICD-10-CM

## 2022-06-19 NOTE — Telephone Encounter (Signed)
  Care Management   Follow Up Note    Name: Yvonne Curtis MRN: [id number] DOB: 1951/01/10   Referred by: Steele Sizer, MD Reason for referral : Care Coordination   Outreach with [name]'s spouse and daughter regarding plan for in home services.   Interventions: Received message from [facility] liaison regarding request for [employer] services. Reports patient is eligible for 20 hours of custodial services via [facility].  Pending call back from [facility] Nurse Manager to confirm start date for services. Confirmed referral was placed for [facility] services for wound care.  Confirmed referral was placed for Home Health Aide to assist with bed baths. Discussed plan with patient's spouse, [name]. Anticipate services started within the next 24-48hrs. Agreed to call if he has not been contacted by the [facility] staff.   Follow Up Plan:  Pending call back from [facility] regarding custodial services. Will follow up this week    [place] Manager/Chronic Care Management [phone]

## 2022-06-20 ENCOUNTER — Telehealth: Payer: Self-pay | Admitting: Family Medicine

## 2022-06-20 ENCOUNTER — Telehealth: Payer: Self-pay

## 2022-06-20 ENCOUNTER — Inpatient Hospital Stay: Payer: PPO

## 2022-06-20 ENCOUNTER — Inpatient Hospital Stay
Admission: EM | Admit: 2022-06-20 | Discharge: 2022-06-23 | DRG: 871 | Disposition: A | Discharge status: Hospice | Payer: PPO | Attending: Internal Medicine | Admitting: Internal Medicine

## 2022-06-20 ENCOUNTER — Emergency Department: Payer: PPO

## 2022-06-20 ENCOUNTER — Encounter: Payer: Self-pay | Admitting: Internal Medicine

## 2022-06-20 DIAGNOSIS — G309 Alzheimer's disease, unspecified: Secondary | ICD-10-CM | POA: Diagnosis present

## 2022-06-20 DIAGNOSIS — E785 Hyperlipidemia, unspecified: Secondary | ICD-10-CM | POA: Diagnosis not present

## 2022-06-20 DIAGNOSIS — Z66 Do not resuscitate: Secondary | ICD-10-CM | POA: Diagnosis not present

## 2022-06-20 DIAGNOSIS — Z885 Allergy status to narcotic agent status: Secondary | ICD-10-CM | POA: Diagnosis not present

## 2022-06-20 DIAGNOSIS — N390 Urinary tract infection, site not specified: Secondary | ICD-10-CM | POA: Diagnosis not present

## 2022-06-20 DIAGNOSIS — R509 Fever, unspecified: Secondary | ICD-10-CM | POA: Diagnosis not present

## 2022-06-20 DIAGNOSIS — Z6833 Body mass index (BMI) 33.0-33.9, adult: Secondary | ICD-10-CM | POA: Diagnosis not present

## 2022-06-20 DIAGNOSIS — N189 Chronic kidney disease, unspecified: Secondary | ICD-10-CM | POA: Diagnosis present

## 2022-06-20 DIAGNOSIS — Z1152 Encounter for screening for COVID-19: Secondary | ICD-10-CM

## 2022-06-20 DIAGNOSIS — E1151 Type 2 diabetes mellitus with diabetic peripheral angiopathy without gangrene: Secondary | ICD-10-CM | POA: Diagnosis present

## 2022-06-20 DIAGNOSIS — Z7189 Other specified counseling: Secondary | ICD-10-CM | POA: Diagnosis not present

## 2022-06-20 DIAGNOSIS — I10 Essential (primary) hypertension: Secondary | ICD-10-CM | POA: Diagnosis present

## 2022-06-20 DIAGNOSIS — I129 Hypertensive chronic kidney disease with stage 1 through stage 4 chronic kidney disease, or unspecified chronic kidney disease: Secondary | ICD-10-CM | POA: Diagnosis present

## 2022-06-20 DIAGNOSIS — Z79899 Other long term (current) drug therapy: Secondary | ICD-10-CM

## 2022-06-20 DIAGNOSIS — A419 Sepsis, unspecified organism: Secondary | ICD-10-CM | POA: Diagnosis not present

## 2022-06-20 DIAGNOSIS — Z7409 Other reduced mobility: Secondary | ICD-10-CM | POA: Diagnosis not present

## 2022-06-20 DIAGNOSIS — F0280 Dementia in other diseases classified elsewhere without behavioral disturbance: Secondary | ICD-10-CM | POA: Diagnosis not present

## 2022-06-20 DIAGNOSIS — E1122 Type 2 diabetes mellitus with diabetic chronic kidney disease: Secondary | ICD-10-CM | POA: Diagnosis present

## 2022-06-20 DIAGNOSIS — N1832 Chronic kidney disease, stage 3b: Secondary | ICD-10-CM | POA: Diagnosis not present

## 2022-06-20 DIAGNOSIS — N183 Chronic kidney disease, stage 3 unspecified: Secondary | ICD-10-CM | POA: Diagnosis present

## 2022-06-20 DIAGNOSIS — Z515 Encounter for palliative care: Secondary | ICD-10-CM

## 2022-06-20 DIAGNOSIS — L89622 Pressure ulcer of left heel, stage 2: Secondary | ICD-10-CM | POA: Diagnosis present

## 2022-06-20 DIAGNOSIS — K219 Gastro-esophageal reflux disease without esophagitis: Secondary | ICD-10-CM | POA: Diagnosis present

## 2022-06-20 DIAGNOSIS — I2510 Atherosclerotic heart disease of native coronary artery without angina pectoris: Secondary | ICD-10-CM | POA: Diagnosis not present

## 2022-06-20 DIAGNOSIS — L89611 Pressure ulcer of right heel, stage 1: Secondary | ICD-10-CM | POA: Diagnosis present

## 2022-06-20 DIAGNOSIS — R339 Retention of urine, unspecified: Secondary | ICD-10-CM | POA: Diagnosis present

## 2022-06-20 DIAGNOSIS — R400 Somnolence: Secondary | ICD-10-CM | POA: Diagnosis not present

## 2022-06-20 DIAGNOSIS — R609 Edema, unspecified: Secondary | ICD-10-CM | POA: Diagnosis not present

## 2022-06-20 DIAGNOSIS — R000 Tachycardia, unspecified: Secondary | ICD-10-CM | POA: Diagnosis present

## 2022-06-20 DIAGNOSIS — D631 Anemia in chronic kidney disease: Secondary | ICD-10-CM | POA: Diagnosis not present

## 2022-06-20 DIAGNOSIS — F32A Depression, unspecified: Secondary | ICD-10-CM | POA: Diagnosis present

## 2022-06-20 DIAGNOSIS — R4020 Unspecified coma: Secondary | ICD-10-CM | POA: Diagnosis not present

## 2022-06-20 DIAGNOSIS — G9341 Metabolic encephalopathy: Secondary | ICD-10-CM | POA: Diagnosis present

## 2022-06-20 DIAGNOSIS — F0390 Unspecified dementia without behavioral disturbance: Secondary | ICD-10-CM | POA: Diagnosis not present

## 2022-06-20 DIAGNOSIS — E669 Obesity, unspecified: Secondary | ICD-10-CM | POA: Diagnosis not present

## 2022-06-20 DIAGNOSIS — R531 Weakness: Secondary | ICD-10-CM | POA: Diagnosis not present

## 2022-06-20 DIAGNOSIS — R1310 Dysphagia, unspecified: Secondary | ICD-10-CM | POA: Diagnosis present

## 2022-06-20 DIAGNOSIS — F05 Delirium due to known physiological condition: Secondary | ICD-10-CM | POA: Diagnosis not present

## 2022-06-20 DIAGNOSIS — R0689 Other abnormalities of breathing: Secondary | ICD-10-CM | POA: Diagnosis not present

## 2022-06-20 DIAGNOSIS — E878 Other disorders of electrolyte and fluid balance, not elsewhere classified: Secondary | ICD-10-CM | POA: Diagnosis present

## 2022-06-20 DIAGNOSIS — R410 Disorientation, unspecified: Secondary | ICD-10-CM | POA: Diagnosis not present

## 2022-06-20 DIAGNOSIS — L89159 Pressure ulcer of sacral region, unspecified stage: Secondary | ICD-10-CM | POA: Diagnosis present

## 2022-06-20 DIAGNOSIS — Z7401 Bed confinement status: Secondary | ICD-10-CM | POA: Diagnosis not present

## 2022-06-20 LAB — COMPREHENSIVE METABOLIC PANEL
ALT: 30 U/L (ref 0–44)
AST: 36 U/L (ref 15–41)
Albumin: 2.5 g/dL — ABNORMAL LOW (ref 3.5–5.0)
Alkaline Phosphatase: 72 U/L (ref 38–126)
Anion gap: 11 (ref 5–15)
BUN: 35 mg/dL — ABNORMAL HIGH (ref 8–23)
CO2: 22 mmol/L (ref 22–32)
Calcium: 9.2 mg/dL (ref 8.9–10.3)
Chloride: 97 mmol/L — ABNORMAL LOW (ref 98–111)
Creatinine, Ser: 1.54 mg/dL — ABNORMAL HIGH (ref 0.44–1.00)
GFR, Estimated: 36 mL/min — ABNORMAL LOW (ref >60)
Glucose, Bld: 110 mg/dL — ABNORMAL HIGH (ref 70–99)
Potassium: 4.5 mmol/L (ref 3.5–5.1)
Sodium: 130 mmol/L — ABNORMAL LOW (ref 135–145)
Total Bilirubin: 1 mg/dL (ref 0.3–1.2)
Total Protein: 6.2 g/dL — ABNORMAL LOW (ref 6.5–8.1)

## 2022-06-20 LAB — CBC WITH DIFFERENTIAL/PLATELET
Abs Immature Granulocytes: 0.22 10*3/uL — ABNORMAL HIGH (ref 0.00–0.07)
Basophils Absolute: 0.1 10*3/uL (ref 0.0–0.1)
Basophils Relative: 0 %
Eosinophils Absolute: 0 10*3/uL (ref 0.0–0.5)
Eosinophils Relative: 0 %
HCT: 33 % — ABNORMAL LOW (ref 36.0–46.0)
Hemoglobin: 10.4 g/dL — ABNORMAL LOW (ref 12.0–15.0)
Immature Granulocytes: 1 %
Lymphocytes Relative: 6 %
Lymphs Abs: 1.4 10*3/uL (ref 0.7–4.0)
MCH: 27 pg (ref 26.0–34.0)
MCHC: 31.5 g/dL (ref 30.0–36.0)
MCV: 85.7 fL (ref 80.0–100.0)
Monocytes Absolute: 1.6 10*3/uL — ABNORMAL HIGH (ref 0.1–1.0)
Monocytes Relative: 7 %
Neutro Abs: 19.6 10*3/uL — ABNORMAL HIGH (ref 1.7–7.7)
Neutrophils Relative %: 86 %
Platelets: 527 10*3/uL — ABNORMAL HIGH (ref 150–400)
RBC: 3.85 MIL/uL — ABNORMAL LOW (ref 3.87–5.11)
RDW: 13.5 % (ref 11.5–15.5)
WBC: 22.9 10*3/uL — ABNORMAL HIGH (ref 4.0–10.5)
nRBC: 0 % (ref 0.0–0.2)

## 2022-06-20 LAB — RESP PANEL BY RT-PCR (RSV, FLU A&B, COVID)  RVPGX2
Influenza A by PCR: NEGATIVE
Influenza B by PCR: NEGATIVE
Resp Syncytial Virus by PCR: NEGATIVE
SARS Coronavirus 2 by RT PCR: NEGATIVE

## 2022-06-20 LAB — APTT: aPTT: 36 seconds (ref 24–36)

## 2022-06-20 LAB — LACTIC ACID, PLASMA: Lactic Acid, Venous: 3.2 mmol/L (ref 0.5–1.9)

## 2022-06-20 LAB — URINALYSIS, W/ REFLEX TO CULTURE (INFECTION SUSPECTED)
Bilirubin Urine: NEGATIVE
Glucose, UA: NEGATIVE mg/dL
Hgb urine dipstick: NEGATIVE
Ketones, ur: NEGATIVE mg/dL
Nitrite: POSITIVE — AB
Protein, ur: NEGATIVE mg/dL
Specific Gravity, Urine: 1.021 (ref 1.005–1.030)
pH: 5 (ref 5.0–8.0)

## 2022-06-20 LAB — PROTIME-INR
INR: 1.4 — ABNORMAL HIGH (ref 0.8–1.2)
Prothrombin Time: 16.6 seconds — ABNORMAL HIGH (ref 11.4–15.2)

## 2022-06-20 MED ORDER — VANCOMYCIN HCL 1500 MG/300ML IV SOLN: 1500.0000 mg | INTRAVENOUS | Status: DC

## 2022-06-20 MED ORDER — DIVALPROEX SODIUM 125 MG PO CSDR
125.0000 mg | DELAYED_RELEASE_CAPSULE | Freq: Two times a day (BID) | ORAL | Status: DC
  Administered 2022-06-22 – 2022-06-23 (×3): 125 mg via ORAL
  Filled 2022-06-20 (×7): qty 1

## 2022-06-20 MED ORDER — ONDANSETRON HCL 4 MG/2ML IJ SOLN: 4.0000 mg | Freq: Four times a day (QID) | INTRAMUSCULAR | Status: DC | PRN

## 2022-06-20 MED ORDER — HEPARIN SODIUM (PORCINE) 5000 UNIT/ML IJ SOLN
5000.0000 [IU] | Freq: Three times a day (TID) | INTRAMUSCULAR | Status: DC
  Administered 2022-06-21 (×2): 5000 [IU] via SUBCUTANEOUS
  Filled 2022-06-20 (×2): qty 1

## 2022-06-20 MED ORDER — LACTATED RINGERS IV SOLN: INTRAVENOUS | Status: DC

## 2022-06-20 MED ORDER — SODIUM CHLORIDE 0.9 % IV SOLN
2.0000 g | Freq: Once | INTRAVENOUS | Status: AC
  Administered 2022-06-20: 2 g via INTRAVENOUS
  Filled 2022-06-20: qty 12.5

## 2022-06-20 MED ORDER — SODIUM CHLORIDE 0.9 % IV SOLN
2.0000 g | Freq: Two times a day (BID) | INTRAVENOUS | Status: DC
  Administered 2022-06-21: 2 g via INTRAVENOUS
  Filled 2022-06-20: qty 2
  Filled 2022-06-20: qty 12.5

## 2022-06-20 MED ORDER — RIVASTIGMINE TARTRATE 1.5 MG PO CAPS
1.5000 mg | ORAL_CAPSULE | Freq: Two times a day (BID) | ORAL | Status: DC
  Administered 2022-06-22 – 2022-06-23 (×3): 1.5 mg via ORAL
  Filled 2022-06-20 (×7): qty 1

## 2022-06-20 MED ORDER — METRONIDAZOLE 500 MG/100ML IV SOLN
500.0000 mg | Freq: Once | INTRAVENOUS | Status: AC
  Administered 2022-06-20: 500 mg via INTRAVENOUS
  Filled 2022-06-20: qty 100

## 2022-06-20 MED ORDER — ONDANSETRON HCL 4 MG PO TABS: 4.0000 mg | ORAL_TABLET | Freq: Four times a day (QID) | ORAL | Status: DC | PRN

## 2022-06-20 MED ORDER — VANCOMYCIN HCL IN DEXTROSE 1-5 GM/200ML-% IV SOLN: 1000.0000 mg | Freq: Once | INTRAVENOUS | Status: DC

## 2022-06-20 MED ORDER — VANCOMYCIN HCL 2000 MG/400ML IV SOLN
2000.0000 mg | Freq: Once | INTRAVENOUS | Status: AC
  Administered 2022-06-20: 2000 mg via INTRAVENOUS
  Filled 2022-06-20: qty 400

## 2022-06-20 MED ORDER — METRONIDAZOLE 500 MG/100ML IV SOLN
500.0000 mg | Freq: Two times a day (BID) | INTRAVENOUS | Status: DC
  Administered 2022-06-21: 500 mg via INTRAVENOUS
  Filled 2022-06-20: qty 100

## 2022-06-20 MED ORDER — MIRTAZAPINE 15 MG PO TBDP
15.0000 mg | ORAL_TABLET | Freq: Every day | ORAL | Status: DC
  Administered 2022-06-22: 15 mg via ORAL
  Filled 2022-06-20 (×4): qty 1

## 2022-06-20 MED ORDER — LORAZEPAM 2 MG/ML IJ SOLN
0.5000 mg | Freq: Once | INTRAMUSCULAR | Status: AC
  Administered 2022-06-20: 0.5 mg via INTRAVENOUS
  Filled 2022-06-20: qty 1

## 2022-06-20 NOTE — Assessment & Plan Note (Signed)
We will culture U/A. Pt was started on triple abx regimen for severe sepsis protocol. Will follow and modify meds.  Gentle IVF strict I/O.

## 2022-06-20 NOTE — Assessment & Plan Note (Signed)
    Latest Ref Rng & Units 06/20/2022    7:49 PM 05/26/2022    1:11 PM 04/03/2022    4:05 PM  CBC  WBC 4.0 - 10.5 K/uL 22.9  17.0  17.1   Hemoglobin 12.0 - 15.0 g/dL 10.4  11.9  12.4   Hematocrit 36.0 - 46.0 % 33.0  37.7  38.9   Platelets 150 - 400 K/uL 527  466  442   Stable we will follow. Type/ screen. IV PPI.

## 2022-06-20 NOTE — Assessment & Plan Note (Signed)
Replace and follow level. ? ?

## 2022-06-20 NOTE — Assessment & Plan Note (Signed)
Lab Results  Component Value Date   CREATININE 1.54 (H) 06/20/2022   CREATININE 1.37 (H) 05/26/2022   CREATININE 1.66 (H) 04/03/2022  Stable. Avoid contrast. Renally dose meds.

## 2022-06-20 NOTE — Assessment & Plan Note (Signed)
Ambulatory dysfunction. Pt has sacral bed sore and suspect has had LTD mobility for a while.

## 2022-06-20 NOTE — Consult Note (Signed)
PHARMACY -  BRIEF ANTIBIOTIC NOTE   Pharmacy has received consult(s) for cefepime and vancomycin dosing from an ED provider.  The patient's profile has been reviewed for ht/wt/allergies/indication/available labs.    One time order(s) placed for vancomycin 1 gram and cefepime 2 grams  Further antibiotics/pharmacy consults should be ordered by admitting physician if indicated.                       Thank you, Lorin Picket, PharmD 06/20/2022  8:41 PM

## 2022-06-20 NOTE — Telephone Encounter (Unsigned)
  Care Management   Follow Up Note   06/20/2022 Name: Yvonne Curtis MRN: [id number] DOB: 22-Dec-1950   Referred by: Steele Sizer, MD Reason for referral : Care Coordination   {CCM OUTREACH [id number]  Follow Up Plan: {CCM FOLLOW UP [id number]  SIGNATURE

## 2022-06-20 NOTE — Assessment & Plan Note (Signed)
Swallow eval and formal barium swallow or GI consult per am team.

## 2022-06-20 NOTE — ED Triage Notes (Signed)
Pt from home to [facility] via [facility] with c/o increased AMS x 1hr. Pt has a hx of dementia and decubitus wounds to coccyx. Pt became increasing confused and yelling out this pm which is not her baseline.

## 2022-06-20 NOTE — Assessment & Plan Note (Signed)
IV PPI. 

## 2022-06-20 NOTE — Sepsis Progress Note (Signed)
Following per sepsis protocol   

## 2022-06-20 NOTE — Consult Note (Signed)
CODE SEPSIS - PHARMACY COMMUNICATION  **Broad Spectrum Antibiotics should be administered within 1 hour of Sepsis diagnosis**  Time Code Sepsis Called/Page Received: 2044  Antibiotics Ordered: cefepime and vancomycin  Time of 1st antibiotic administration: 2109  Additional action taken by pharmacy: [place] ,PharmD Clinical Pharmacist  06/20/2022  8:41 PM

## 2022-06-20 NOTE — Assessment & Plan Note (Addendum)
2/2 to severe sepsis.

## 2022-06-20 NOTE — Telephone Encounter (Signed)
Spoke with [employer]. Informed Dr.Sowles of nurse obtaining labs on Monday as she was unable to do so today.

## 2022-06-20 NOTE — Telephone Encounter (Signed)
Callina from [facility], says received lab orders not signed, needs verbal. Please call back

## 2022-06-20 NOTE — H&P (Signed)
History and Physical    Chief Complaint: Confusion.    HISTORY OF PRESENT ILLNESS: Yvonne Curtis is an 72 y.o. female  living at home brought by EMS  and family members concerned  for confusion that is different than her baseline dementia. Pt is bedridden and has pressure injures but wound is stage II. Pt also has nitrite positive urine. In Ed pt meets sepsis criteria.    Pt has  Past Medical History:  Diagnosis Date   Arthritis    knees   Cataract 11/25/2017   Nuclear - OU   Diabetes mellitus without complication ([place])    Hyperlipidemia    Hyperopia, bilateral 11/25/2017   [place]reech. [place]   Hypertension    Obesity    Patient is Sales promotion account executive Witness    declines blood products   Wears dentures    full upper    Review of Systems  Unable to perform ROS: Dementia   Allergies  Allergen Reactions   Oxycodone Other (See Comments)   Oxycodone Hcl     insomnia, agitation   Past Surgical History:  Procedure Laterality Date   CHOLECYSTECTOMY     COLONOSCOPY WITH PROPOFOL N/A 11/30/2014   Procedure: COLONOSCOPY WITH PROPOFOL;  Surgeon: Lucilla Lame, MD;  Location: [place];  Service: Endoscopy;  Laterality: N/A;  WITH BIOPSY-- TRANSVERSE COLON POLYP  X 2 DESCENING COLON POLYP   COLONOSCOPY WITH PROPOFOL N/A 01/17/2020   Procedure: COLONOSCOPY WITH PROPOFOL;  Surgeon: Lucilla Lame, MD;  Location: [facility] ENDOSCOPY;  Service: Endoscopy;  Laterality: N/A;   ESOPHAGOGASTRODUODENOSCOPY (EGD) WITH PROPOFOL N/A 08/23/2020   Procedure: ESOPHAGOGASTRODUODENOSCOPY (EGD) WITH PROPOFOL;  Surgeon: Lucilla Lame, MD;  Location: [facility] ENDOSCOPY;  Service: Endoscopy;  Laterality: N/A;   GALLBLADDER SURGERY     SEPTOPLASTY     tonsillectomy     TONSILLECTOMY     TUBAL LIGATION         MEDICATIONS: Current Outpatient Medications  Medication Instructions   amLODipine (NORVASC) 2.5 mg, Oral, Daily   blood glucose meter kit and supplies Dispense based on patient and insurance  preference. Use once daily as directed. (FOR ICD-10 E10.9, E11.9).<BR>STrips 100<BR>Lancets 100   divalproex (DEPAKOTE SPRINKLE) 125 mg, Oral, 2 times daily   doxycycline (VIBRAMYCIN) 100 mg, Oral, 2 times daily   mirtazapine (REMERON SOL-TAB) 15 mg, Oral, Daily at bedtime   rivastigmine (EXELON) 1.5 mg, Oral, 2 times daily   telmisartan (MICARDIS) 80 mg, Oral, Daily     divalproex  125 mg Oral BID   heparin  5,000 Units Subcutaneous Q8H   mirtazapine  15 mg Oral QHS   rivastigmine  1.5 mg Oral BID     [START ON 06/21/2022] ceFEPime (MAXIPIME) IV     lactated ringers 150 mL/hr at 06/20/22 2111   [START ON 06/21/2022] metronidazole     [START ON 06/22/2022] vancomycin     vancomycin          ED Course: Pt in Ed pt is alert afebrile and tachycardic. Vitals:   06/20/22 1935 06/20/22 1936 06/20/22 1937 06/20/22 2008  BP:  130/66    Pulse:  (!) 117    Resp:  18    Temp:  100.3 F (37.9 C)  99.9 F (37.7 C)  TempSrc:  Axillary  Axillary  SpO2: 96% 96%    Weight:   90.7 kg   Height:   5' 5"$  (1.651 m)     No intake/output data recorded. SpO2: 96 % Blood work in ed shows:  Lactic of 3.2 hyponatremia of 130 glucose 110 AKI of 1.54 normal LFTs, white count of 22.9 hemoglobin of 10.4 platelets of 527. Urinalysis shows nitrite positive leukocytes present 21-50 WBCs. Results for orders placed or performed during the hospital encounter of 06/20/22 (from the past 24 hour(s))  Lactic acid, plasma     Status: Abnormal   Collection Time: 06/20/22  7:49 PM  Result Value Ref Range   Lactic Acid, Venous 3.2 (HH) 0.5 - 1.9 mmol/L  Comprehensive metabolic panel     Status: Abnormal   Collection Time: 06/20/22  7:49 PM  Result Value Ref Range   Sodium 130 (L) 135 - 145 mmol/L   Potassium 4.5 3.5 - 5.1 mmol/L   Chloride 97 (L) 98 - 111 mmol/L   CO2 22 22 - 32 mmol/L   Glucose, Bld 110 (H) 70 - 99 mg/dL   BUN 35 (H) 8 - 23 mg/dL   Creatinine, Ser 1.54 (H) 0.44 - 1.00 mg/dL   Calcium 9.2 8.9  - 10.3 mg/dL   Total Protein 6.2 (L) 6.5 - 8.1 g/dL   Albumin 2.5 (L) 3.5 - 5.0 g/dL   AST 36 15 - 41 U/L   ALT 30 0 - 44 U/L   Alkaline Phosphatase 72 38 - 126 U/L   Total Bilirubin 1.0 0.3 - 1.2 mg/dL   GFR, Estimated 36 (L) >60 mL/min   Anion gap 11 5 - 15  CBC with Differential     Status: Abnormal   Collection Time: 06/20/22  7:49 PM  Result Value Ref Range   WBC 22.9 (H) 4.0 - 10.5 K/uL   RBC 3.85 (L) 3.87 - 5.11 MIL/uL   Hemoglobin 10.4 (L) 12.0 - 15.0 g/dL   HCT 33.0 (L) 36.0 - 46.0 %   MCV 85.7 80.0 - 100.0 fL   MCH 27.0 26.0 - 34.0 pg   MCHC 31.5 30.0 - 36.0 g/dL   RDW 13.5 11.5 - 15.5 %   Platelets 527 (H) 150 - 400 K/uL   nRBC 0.0 0.0 - 0.2 %   Neutrophils Relative % 86 %   Neutro Abs 19.6 (H) 1.7 - 7.7 K/uL   Lymphocytes Relative 6 %   Lymphs Abs 1.4 0.7 - 4.0 K/uL   Monocytes Relative 7 %   Monocytes Absolute 1.6 (H) 0.1 - 1.0 K/uL   Eosinophils Relative 0 %   Eosinophils Absolute 0.0 0.0 - 0.5 K/uL   Basophils Relative 0 %   Basophils Absolute 0.1 0.0 - 0.1 K/uL   Immature Granulocytes 1 %   Abs Immature Granulocytes 0.22 (H) 0.00 - 0.07 K/uL  Protime-INR     Status: Abnormal   Collection Time: 06/20/22  7:49 PM  Result Value Ref Range   Prothrombin Time 16.6 (H) 11.4 - 15.2 seconds   INR 1.4 (H) 0.8 - 1.2  APTT     Status: None   Collection Time: 06/20/22  7:49 PM  Result Value Ref Range   aPTT 36 24 - 36 seconds  Resp panel by RT-PCR (RSV, Flu A&B, Covid) Urine, Catheterized     Status: None   Collection Time: 06/20/22  7:52 PM   Specimen: Urine, Catheterized; Nasal Swab  Result Value Ref Range   SARS Coronavirus 2 by RT PCR NEGATIVE NEGATIVE   Influenza A by PCR NEGATIVE NEGATIVE   Influenza B by PCR NEGATIVE NEGATIVE   Resp Syncytial Virus by PCR NEGATIVE NEGATIVE  Urinalysis, w/ Reflex to Culture (Infection Suspected) -Urine, Catheterized  Status: Abnormal   Collection Time: 06/20/22  8:18 PM  Result Value Ref Range   Specimen Source URINE,  CATHETERIZED    Color, Urine AMBER (A) YELLOW   APPearance HAZY (A) CLEAR   Specific Gravity, Urine 1.021 1.005 - 1.030   pH 5.0 5.0 - 8.0   Glucose, UA NEGATIVE NEGATIVE mg/dL   Hgb urine dipstick NEGATIVE NEGATIVE   Bilirubin Urine NEGATIVE NEGATIVE   Ketones, ur NEGATIVE NEGATIVE mg/dL   Protein, ur NEGATIVE NEGATIVE mg/dL   Nitrite POSITIVE (A) NEGATIVE   Leukocytes,Ua MODERATE (A) NEGATIVE   RBC / HPF 0-5 0 - 5 RBC/hpf   WBC, UA 21-50 0 - 5 WBC/hpf   Bacteria, UA RARE (A) NONE SEEN   Squamous Epithelial / HPF 11-20 0 - 5 /HPF   Mucus PRESENT    Hyaline Casts, UA PRESENT     Unresulted Labs (From admission, onward)     Start     Ordered   06/21/22 0500  Protime-INR  Tomorrow morning,   STAT        06/20/22 2146   06/21/22 0500  Procalcitonin  Tomorrow morning,   URGENT        06/20/22 2146   06/20/22 2131  CBC  (heparin)  Once,   STAT       Comments: Baseline for heparin therapy IF NOT ALREADY DRAWN.  Notify MD if PLT < 100 K.    06/20/22 2146   06/20/22 2131  Creatinine, serum  (heparin)  Once,   STAT       Comments: Baseline for heparin therapy IF NOT ALREADY DRAWN.    06/20/22 2146   06/20/22 1949  Lactic acid, plasma  (Undifferentiated presentation (screening labs and basic nursing orders))  Now then every 2 hours,   STAT      06/20/22 1949   06/20/22 1949  Blood Culture (routine x 2)  (Undifferentiated presentation (screening labs and basic nursing orders))  BLOOD CULTURE X 2,   STAT      06/20/22 1949           Pt has received : Orders Placed This Encounter  Procedures   Blood Culture (routine x 2)    Standing Status:   Standing    Number of Occurrences:   2   Resp panel by RT-PCR (RSV, Flu A&B, Covid) Anterior Nasal Swab    Standing Status:   Standing    Number of Occurrences:   1   DG Chest Port 1 View    Standing Status:   Standing    Number of Occurrences:   1    Order Specific Question:   Reason for Exam (SYMPTOM  OR DIAGNOSIS REQUIRED)     Answer:   Questionable sepsis - evaluate for abnormality   CT HEAD WO CONTRAST (5MM)    Standing Status:   Standing    Number of Occurrences:   1   Lactic acid, plasma    Standing Status:   Standing    Number of Occurrences:   2   Comprehensive metabolic panel    Standing Status:   Standing    Number of Occurrences:   1   CBC with Differential    Standing Status:   Standing    Number of Occurrences:   1   Protime-INR    Standing Status:   Standing    Number of Occurrences:   1   APTT    Standing Status:   Standing  Number of Occurrences:   1   Urinalysis, w/ Reflex to Culture (Infection Suspected) -Urine, Catheterized    Standing Status:   Standing    Number of Occurrences:   1    Order Specific Question:   Specimen Source    Answer:   Urine, Catheterized [36]   Protime-INR    Standing Status:   Standing    Number of Occurrences:   1   Procalcitonin    Standing Status:   Standing    Number of Occurrences:   1   CBC    Baseline for heparin therapy IF NOT ALREADY DRAWN.  Notify MD if PLT < 100 K.    Standing Status:   Standing    Number of Occurrences:   1   Creatinine, serum    Baseline for heparin therapy IF NOT ALREADY DRAWN.    Standing Status:   Standing    Number of Occurrences:   1   Diet NPO time specified    Standing Status:   Standing    Number of Occurrences:   1   Document height and weight    Standing Status:   Standing    Number of Occurrences:   1   Document vital signs within 1-hour of fluid bolus completion.  Notify provider of abnormal vital signs despite fluid resuscitation.    Standing Status:   Standing    Number of Occurrences:   1   Refer to Sidebar Report: Sepsis Bundle ED/IP    Sepsis Bundle ED/IP    Standing Status:   Standing    Number of Occurrences:   1   Notify provider for difficulties obtaining IV access    Standing Status:   Standing    Number of Occurrences:   1   Initiate Carrier Fluid Protocol    Standing Status:   Standing     Number of Occurrences:   1   DO NOT delay antibiotics if unable to obtain blood culture.    Standing Status:   Standing    Number of Occurrences:   1   Vital signs    Standing Status:   Standing    Number of Occurrences:   1   Notify physician (specify)    Standing Status:   Standing    Number of Occurrences:   20    Order Specific Question:   Notify Physician    Answer:   for pulse less than 55 or greater than 120    Order Specific Question:   Notify Physician    Answer:   for respiratory rate less than 12 or greater than 25    Order Specific Question:   Notify Physician    Answer:   for temperature greater than 100.5 F    Order Specific Question:   Notify Physician    Answer:   for urinary output less than 30 mL/hr for four hours    Order Specific Question:   Notify Physician    Answer:   for systolic BP less than 90 or greater than 0000000, diastolic BP less than 60 or greater than 100    Order Specific Question:   Notify Physician    Answer:   for new hypoxia w/ oxygen saturations < 88%   Progressive Mobility Protocol: No Restrictions    Standing Status:   Standing    Number of Occurrences:   1   If lactate (lactic acid) >2, verify repeat lactic acid order has been placed to  be drawn    Standing Status:   Standing    Number of Occurrences:   1   Document vital signs within 1-hour of fluid bolus completion and notify provider of bolus completion    Standing Status:   Standing    Number of Occurrences:   1   Vital signs    Standing Status:   Standing    Number of Occurrences:   1   Vital signs    Standing Status:   Standing    Number of Occurrences:   1   RN to call RRT (rapid response team)    Standing Status:   Standing    Number of Occurrences:   1   Apply Sepsis Care Plan    Standing Status:   Standing    Number of Occurrences:   1   Refer to Sidebar Report: Sepsis Bundle ED/IP    Sepsis Bundle ED/IP    Standing Status:   Standing    Number of Occurrences:   1    Assess and Document Glasgow Coma Scale    Standing Status:   Standing    Number of Occurrences:   1   Initiate Oral Care Protocol    Standing Status:   Standing    Number of Occurrences:   1   Initiate Carrier Fluid Protocol    Standing Status:   Standing    Number of Occurrences:   1   RN may order General Admission PRN Orders utilizing "General Admission PRN medications" (through manage orders) for the following patient needs: allergy symptoms (Claritin), cold sores (Carmex), cough (Robitussin DM), eye irritation (Liquifilm Tears), hemorrhoids (Tucks), indigestion (Maalox), minor skin irritation (Hydrocortisone Cream), muscle pain Suezanne Jacquet Gay), nose irritation (saline nasal spray) and sore throat (Chloraseptic spray).    Standing Status:   Standing    Number of Occurrences:   [phone]   SCDs    Standing Status:   Standing    Number of Occurrences:   1    Order Specific Question:   Laterality    Answer:   Bilateral   Cardiac Monitoring Continuous x 48 hours Indications for use: Other; Other indications for use: sepsis    Standing Status:   Standing    Number of Occurrences:   1    Order Specific Question:   Indications for use:    Answer:   Other    Order Specific Question:   Other indications for use:    Answer:   sepsis   Swallow screen    Standing Status:   Standing    Number of Occurrences:   1   Full code    Standing Status:   Standing    Number of Occurrences:   1    Order Specific Question:   By:    Answer:   Other   Code Sepsis activation.  This occurs automatically when order is signed and prioritizes pharmacy, lab, and radiology services for STAT collections and interventions.  If [facility] downtime, call [facility] [phone]) to activate Code Sepsis.    Standing Status:   Standing    Number of Occurrences:   1    Order Specific Question:   Initiate "Code Sepsis" tracking    Answer:   Yes    Order Specific Question:   Contact eLink ([phone])    Answer:   No    Order  Specific Question:   Reason for Consult?    Answer:   tracking   Consult  to hospitalist    Standing Status:   Standing    Number of Occurrences:   1    Order Specific Question:   Place call to:    Answer:   [phone]    Order Specific Question:   Reason for Consult    Answer:   Admit    Order Specific Question:   Diagnosis/Clinical Info for Consult:    Answer:   sepsis   vancomycin per pharmacy consult    Standing Status:   Standing    Number of Occurrences:   1    Order Specific Question:   Indication:    Answer:   Sepsis   CeFEPIme (MAXIPIME) per pharmacy consult             Standing Status:   Standing    Number of Occurrences:   1    Order Specific Question:   Antibiotic Indication:    Answer:   Sepsis   Airborne and Contact precautions    Standing Status:   Standing    Number of Occurrences:   1   Pulse oximetry (single)    Standing Status:   Standing    Number of Occurrences:   99999   Pulse oximetry check with vital signs    Standing Status:   Standing    Number of Occurrences:   1   EKG 12-Lead    Standing Status:   Standing    Number of Occurrences:   1   ED EKG 12-Lead    Standing Status:   Standing    Number of Occurrences:   1    Order Specific Question:   Notes    Answer:   Baseline   Insert peripheral IV X 1    Angiocath size 20G or larger    Standing Status:   Standing    Number of Occurrences:   1   Insert 2nd peripheral IV if not already present.    Standing Status:   Standing    Number of Occurrences:   28   Admit to Inpatient (patient's expected length of stay will be greater than 2 midnights or inpatient only procedure)    Standing Status:   Standing    Number of Occurrences:   1    Order Specific Question:   Hospital Area    Answer:   [facility] [[id number]]    Order Specific Question:   Level of Care    Answer:   Telemetry Medical [104]    Order Specific Question:   Covid Evaluation    Answer:   Asymptomatic - no recent  exposure (last 10 days) testing not required    Order Specific Question:   Diagnosis    Answer:   Confusion BP:[id number]    Order Specific Question:   Admitting Physician    Answer:   Cherylann Ratel    Order Specific Question:   Attending Physician    Answer:   Cherylann Ratel    Order Specific Question:   Certification:    Answer:   I certify this patient will need inpatient services for at least 2 midnights    Order Specific Question:   Estimated Length of Stay    Answer:   2   Aspiration precautions    Standing Status:   Standing    Number of Occurrences:   1   Fall precautions    Standing Status:   Standing    Number of  Occurrences:   1   Seizure precautions    Standing Status:   Standing    Number of Occurrences:   1    Meds ordered this encounter  Medications   LORazepam (ATIVAN) injection 0.5 mg   lactated ringers infusion   ceFEPIme (MAXIPIME) 2 g in sodium chloride 0.9 % 100 mL IVPB    Order Specific Question:   Antibiotic Indication:    Answer:   Other Indication (list below)    Order Specific Question:   Other Indication:    Answer:   Unknown source   metroNIDAZOLE (FLAGYL) IVPB 500 mg    Order Specific Question:   Antibiotic Indication:    Answer:   Other Indication (list below)    Order Specific Question:   Other Indication:    Answer:   Unknown source   DISCONTD: vancomycin (VANCOCIN) IVPB 1000 mg/200 mL premix    Order Specific Question:   Indication:    Answer:   Other Indication (list below)    Order Specific Question:   Other Indication:    Answer:   Unknown source   divalproex (DEPAKOTE SPRINKLE) capsule 125 mg   rivastigmine (EXELON) capsule 1.5 mg   mirtazapine (REMERON SOL-TAB) disintegrating tablet 15 mg   heparin injection 5,000 Units   OR Linked Order Group    ondansetron (ZOFRAN) tablet 4 mg    ondansetron (ZOFRAN) injection 4 mg   metroNIDAZOLE (FLAGYL) IVPB 500 mg    Order Specific Question:   Antibiotic Indication:    Answer:    Other Indication (list below)    Order Specific Question:   Other Indication:    Answer:   Unknown source   ceFEPIme (MAXIPIME) 2 g in sodium chloride 0.9 % 100 mL IVPB    Order Specific Question:   Antibiotic Indication:    Answer:   Sepsis   vancomycin (VANCOREADY) IVPB 2000 mg/400 mL    Order Specific Question:   Indication:    Answer:   Sepsis   vancomycin (VANCOREADY) IVPB 1500 mg/300 mL    Order Specific Question:   Indication:    Answer:   Sepsis     Admission Imaging : DG Chest Port 1 View  Result Date: 06/20/2022 CLINICAL DATA:  Altered level of consciousness, dementia, decubitus ulcers, possible sepsis EXAM: PORTABLE CHEST 1 VIEW COMPARISON:  05/26/2022 FINDINGS: Single frontal view of the chest demonstrates an unremarkable cardiac silhouette. No airspace disease, effusion, or pneumothorax. No acute bony abnormalities. IMPRESSION: 1. No acute intrathoracic process. Electronically Signed   By: Randa Ngo M.D.   On: 06/20/2022 20:26    Physical Examination: Vitals:   06/20/22 1935 06/20/22 1936 06/20/22 1937 06/20/22 2008  BP:  130/66    Pulse:  (!) 117    Temp:  100.3 F (37.9 C)  99.9 F (37.7 C)  Resp:  18    Height:   5' 5"$  (1.651 m)   Weight:   90.7 kg   SpO2: 96% 96%    TempSrc:  Axillary  Axillary  BMI (Calculated):   33.28    Physical Exam Vitals and nursing note reviewed.  Constitutional:      General: She is not in acute distress.    Appearance: She is obese. She is not ill-appearing, toxic-appearing or diaphoretic.  HENT:     Head: Normocephalic and atraumatic.     Right Ear: Hearing and external ear normal.     Left Ear: Hearing and external ear normal.  Nose: Nose normal. No nasal deformity.     Mouth/Throat:     Lips: Pink.     Mouth: Mucous membranes are moist.     Tongue: No lesions.     Pharynx: Oropharynx is clear.  Eyes:     General: Lids are normal.     Extraocular Movements: Extraocular movements intact.     Comments: Pinpoint.   Cardiovascular:     Rate and Rhythm: Normal rate and regular rhythm.     Pulses: Normal pulses.     Heart sounds: Normal heart sounds.  Pulmonary:     Effort: Pulmonary effort is normal.     Breath sounds: Normal breath sounds.  Abdominal:     General: Bowel sounds are normal. There is no distension.     Palpations: Abdomen is soft. There is no mass.     Tenderness: There is no abdominal tenderness. There is no guarding.     Hernia: No hernia is present.  Musculoskeletal:     Right lower leg: No edema.     Left lower leg: No edema.  Skin:    General: Skin is warm.  Neurological:     General: No focal deficit present.     Mental Status: She is lethargic.       Assessment and Plan: * Confusion Pt was confused on arrival and suspect 2/2 to her UTI.  Aspiration and fall precaution.  Head ct ordered.    Sepsis secondary to UTI [facility]) We will culture U/A. Pt was started on triple abx regimen for severe sepsis protocol. Will follow and modify meds.  Gentle IVF strict I/O.  Sinus tachycardia by electrocardiogram 2/2 to severe sepsis.   Electrolyte abnormality Replace and follow level.  Impaired functional mobility, balance, gait, and endurance Ambulatory dysfunction. Pt has sacral bed sore and suspect has had LTD mobility for a while.    Anemia in chronic kidney disease    Latest Ref Rng & Units 06/20/2022    7:49 PM 05/26/2022    1:11 PM 04/03/2022    4:05 PM  CBC  WBC 4.0 - 10.5 K/uL 22.9  17.0  17.1   Hemoglobin 12.0 - 15.0 g/dL 10.4  11.9  12.4   Hematocrit 36.0 - 46.0 % 33.0  37.7  38.9   Platelets 150 - 400 K/uL 527  466  442   Stable we will follow. Type/ screen. IV PPI.  Chronic kidney disease, stage 3b (HCC) Lab Results  Component Value Date   CREATININE 1.54 (H) 06/20/2022   CREATININE 1.37 (H) 05/26/2022   CREATININE 1.66 (H) 04/03/2022  Stable. Avoid contrast. Renally dose meds.    Dysphagia Swallow eval and formal barium swallow or GI  consult per am team.   Controlled type 2 diabetes mellitus with stage 3 chronic kidney disease, without long-term current use of insulin (HCC) SSI. A1c.    Gastro-esophageal reflux disease without esophagitis IV PPI  Benign hypertension Vitals:   06/20/22 1936  BP: 130/66  Pressures are normal will hold all bp meds to prevent hypotension. Home regimen of telmisartan and amlodipine./      DVT prophylaxis:  Heparin Q 12.    Code Status:  Full code .   Family Communication:  ZAINAH, FRINK (Spouse)  [phone] .  Disposition Plan:  TBD.   Consults called:  None .  Admission status: Inpatient.    Unit/ Expected LOS: Med tele. / 2-3 days.    Para Skeans MD [facility]  6 PM- 2  AM. Please contact me via secure Chat 6 PM-2 AM. [phone]( Pager ) To contact the [facility] Attending or Consulting provider [place] or covering provider during after hours [place], for this patient.   Check the care team in [facility] and look for a) attending/consulting [facility] provider listed and b) the [facility] team listed Log into [email or web address] and use [place]'s universal password to access. If you do not have the password, please contact the hospital operator. Locate the [facility] provider you are looking for under [facility] and page to a number that you can be directly reached. If you still have difficulty reaching the provider, please page the [facility] (Director on Call) for the Hospitalists listed on amion for assistance. [email or web address] 06/20/2022, 10:17 PM

## 2022-06-20 NOTE — Assessment & Plan Note (Signed)
SSI. A1c.

## 2022-06-20 NOTE — Assessment & Plan Note (Signed)
Pt was confused on arrival and suspect 2/2 to her UTI.  Aspiration and fall precaution.  Head ct ordered.

## 2022-06-20 NOTE — ED Provider Notes (Signed)
[facility] Provider Note    Event Date/Time   First MD Initiated Contact with Patient 06/20/22 1929     (approximate)   History   Altered Mental Status   HPI  Yvonne Curtis is a 72 y.o. female with history of dementia, diabetes, hypertension who presents for altered mental status.  Apparently the patient is acting unusually for her, EMS reports temperature of 100.3, she does have sacral ulcers which are chronic.     Physical Exam   Triage Vital Signs: ED Triage Vitals  Enc Vitals Group     BP 06/20/22 1936 130/66     Pulse Rate 06/20/22 1936 (!) 117     Resp 06/20/22 1936 18     Temp 06/20/22 1936 100.3 F (37.9 C)     Temp Source 06/20/22 1936 Axillary     SpO2 06/20/22 1935 96 %     Weight 06/20/22 1937 90.7 kg (200 lb)     Height 06/20/22 1937 1.651 m (5' 5"$ )     Head Circumference --      Peak Flow --      Pain Score --      Pain Loc --      Pain Edu? --      Excl. in [place]? --     Most recent vital signs: Vitals:   06/20/22 1936 06/20/22 2008  BP: 130/66   Pulse: (!) 117   Resp: 18   Temp: 100.3 F (37.9 C) 99.9 F (37.7 C)  SpO2: 96%      General: Awake, no distress.  CV:  Good peripheral perfusion.  Resp:  Normal effort.  Clear to auscultation bilaterally Abd:  Soft, nontender no distention.  Stage II sacral ulcers, no surrounding erythema to suggest infection Other:     ED Results / Procedures / Treatments   Labs (all labs ordered are listed, but only abnormal results are displayed) Labs Reviewed  LACTIC ACID, PLASMA - Abnormal; Notable for the following components:      Result Value   Lactic Acid, Venous 3.2 (*)    All other components within normal limits  COMPREHENSIVE METABOLIC PANEL - Abnormal; Notable for the following components:   Sodium 130 (*)    Chloride 97 (*)    Glucose, Bld 110 (*)    BUN 35 (*)    Creatinine, Ser 1.54 (*)    Total Protein 6.2 (*)    Albumin 2.5 (*)    GFR, Estimated 36  (*)    All other components within normal limits  CBC WITH DIFFERENTIAL/PLATELET - Abnormal; Notable for the following components:   WBC 22.9 (*)    RBC 3.85 (*)    Hemoglobin 10.4 (*)    HCT 33.0 (*)    Platelets 527 (*)    Neutro Abs 19.6 (*)    Monocytes Absolute 1.6 (*)    Abs Immature Granulocytes 0.22 (*)    All other components within normal limits  PROTIME-INR - Abnormal; Notable for the following components:   Prothrombin Time 16.6 (*)    INR 1.4 (*)    All other components within normal limits  URINALYSIS, W/ REFLEX TO CULTURE (INFECTION SUSPECTED) - Abnormal; Notable for the following components:   Color, Urine AMBER (*)    APPearance HAZY (*)    Nitrite POSITIVE (*)    Leukocytes,Ua MODERATE (*)    Bacteria, UA RARE (*)    All other components within normal limits  RESP PANEL BY  RT-PCR (RSV, FLU A&B, COVID)  RVPGX2  CULTURE, BLOOD (ROUTINE X 2)  CULTURE, BLOOD (ROUTINE X 2)  APTT  LACTIC ACID, PLASMA  PROTIME-INR  PROCALCITONIN  CBC  CREATININE, SERUM     EKG  ED ECG REPORT I, Lavonia Drafts, the attending physician, personally viewed and interpreted this ECG.  Date: 06/20/2022  Rhythm: Tachycardia QRS Axis: normal Intervals: normal ST/T Wave abnormalities: normal Narrative Interpretation: no evidence of acute ischemia    RADIOLOGY Chest x-ray viewed interpreted by me, no pneumonia    PROCEDURES:  Critical Care performed: yes  CRITICAL CARE Performed by: Lavonia Drafts   Total critical care time: 30 minutes  Critical care time was exclusive of separately billable procedures and treating other patients.  Critical care was necessary to treat or prevent imminent or life-threatening deterioration.  Critical care was time spent personally by me on the following activities: development of treatment plan with patient and/or surrogate as well as nursing, discussions with consultants, evaluation of patient's response to treatment, examination of  patient, obtaining history from patient or surrogate, ordering and performing treatments and interventions, ordering and review of laboratory studies, ordering and review of radiographic studies, pulse oximetry and re-evaluation of patient's condition.   Procedures   MEDICATIONS ORDERED IN ED: Medications  lactated ringers infusion ( Intravenous New Bag/Given 06/20/22 2111)  metroNIDAZOLE (FLAGYL) IVPB 500 mg (500 mg Intravenous New Bag/Given 06/20/22 2113)  vancomycin (VANCOCIN) IVPB 1000 mg/200 mL premix (has no administration in time range)  divalproex (DEPAKOTE SPRINKLE) capsule 125 mg (has no administration in time range)  rivastigmine (EXELON) capsule 1.5 mg (has no administration in time range)  mirtazapine (REMERON SOL-TAB) disintegrating tablet 15 mg (has no administration in time range)  heparin injection 5,000 Units (has no administration in time range)  ondansetron (ZOFRAN) tablet 4 mg (has no administration in time range)    Or  ondansetron (ZOFRAN) injection 4 mg (has no administration in time range)  metroNIDAZOLE (FLAGYL) IVPB 500 mg (has no administration in time range)  LORazepam (ATIVAN) injection 0.5 mg (0.5 mg Intravenous Given 06/20/22 2029)  ceFEPIme (MAXIPIME) 2 g in sodium chloride 0.9 % 100 mL IVPB (2 g Intravenous New Bag/Given 06/20/22 2109)     IMPRESSION / MDM / ASSESSMENT AND PLAN / ED COURSE  I reviewed the triage vital signs and the nursing notes. Patient's presentation is most consistent with acute presentation with potential threat to life or bodily function.  Patient presents with altered mental status, difficult to assess given her baseline dementia.  She does have a temperature of 100.3 and is tachycardic.  Concern for infection/sepsis, screening labs ordered  White blood cell count is significantly elevated at 22, lactic is elevated at 3.2, code sepsis activated, broad-spectrum IV antibiotics ordered  Urinalysis is suspicious for urinary tract  infection, patient also has decubitus ulcers which could be a source of infection, have discussed with the hospitalist for admission       FINAL CLINICAL IMPRESSION(S) / ED DIAGNOSES   Final diagnoses:  Sepsis, due to unspecified organism, unspecified whether acute organ dysfunction present [facility])     Rx / DC Orders   ED Discharge Orders     None        Note:  This document was prepared using Dragon voice recognition software and may include unintentional dictation errors.   Lavonia Drafts, MD 06/20/22 2149

## 2022-06-20 NOTE — Consult Note (Signed)
Pharmacy Antibiotic Note  Yvonne Curtis is a 72 y.o. female admitted on 06/20/2022 with sepsis.  Pharmacy has been consulted for vancomycin and cefepime dosing.  Plan: Start cefepime 2 grams IV every 12 hours Give vancomycin 2000 mg IV x 1, then start vancomycin 1500 mg IV every 36 hours. AUC goal 400-550 Estimated AUC 526.9, Cmin 13 Wt 90.7 kg, Scr 1.54, Vd coefficient 0.72  Vancomycin levels at steady state or as clinically indicated Follow renal function for adjustments  Height: 5' 5"$  (165.1 cm) Weight: 90.7 kg (200 lb) IBW/kg (Calculated) : 57  Temp (24hrs), Avg:100.1 F (37.8 C), Min:99.9 F (37.7 C), Max:100.3 F (37.9 C)  Recent Labs  Lab 06/20/22 1949  WBC 22.9*  CREATININE 1.54*  LATICACIDVEN 3.2*    Estimated Creatinine Clearance: 36.8 mL/min (A) (by C-G formula based on SCr of 1.54 mg/dL (H)).    Allergies  Allergen Reactions   Oxycodone Other (See Comments)   Oxycodone Hcl     insomnia, agitation    Antimicrobials this admission: Cefepime 2/16 >>  vancomycin 2/16 >>  Flagyl 2/16 >>  Dose adjustments this admission: N/A  Microbiology results: 2/16 BCx: pending  Thank you for allowing pharmacy to be a part of this patient's care.  Lorin Picket, PharmD 06/20/2022 9:50 PM

## 2022-06-20 NOTE — Assessment & Plan Note (Signed)
Vitals:   06/20/22 1936  BP: 130/66  Pressures are normal will hold all bp meds to prevent hypotension. Home regimen of telmisartan and amlodipine./

## 2022-06-21 ENCOUNTER — Other Ambulatory Visit: Payer: Self-pay

## 2022-06-21 DIAGNOSIS — Z7409 Other reduced mobility: Secondary | ICD-10-CM | POA: Diagnosis not present

## 2022-06-21 DIAGNOSIS — N390 Urinary tract infection, site not specified: Secondary | ICD-10-CM

## 2022-06-21 DIAGNOSIS — R410 Disorientation, unspecified: Secondary | ICD-10-CM | POA: Diagnosis not present

## 2022-06-21 DIAGNOSIS — A419 Sepsis, unspecified organism: Secondary | ICD-10-CM

## 2022-06-21 DIAGNOSIS — Z7189 Other specified counseling: Secondary | ICD-10-CM

## 2022-06-21 LAB — PROTIME-INR
INR: 1.5 — ABNORMAL HIGH (ref 0.8–1.2)
Prothrombin Time: 18.2 seconds — ABNORMAL HIGH (ref 11.4–15.2)

## 2022-06-21 LAB — LACTIC ACID, PLASMA
Lactic Acid, Venous: 2.4 mmol/L (ref 0.5–1.9)
Lactic Acid, Venous: 2.8 mmol/L (ref 0.5–1.9)

## 2022-06-21 LAB — PROCALCITONIN: Procalcitonin: <0.1 ng/mL

## 2022-06-21 MED ORDER — MORPHINE SULFATE (PF) 2 MG/ML IV SOLN: 2.0000 mg | INTRAVENOUS | Status: DC | PRN

## 2022-06-21 MED ORDER — SODIUM CHLORIDE 0.9 % IV SOLN
1.0000 g | INTRAVENOUS | Status: DC
  Administered 2022-06-21 – 2022-06-22 (×2): 1 g via INTRAVENOUS
  Filled 2022-06-21 (×2): qty 10

## 2022-06-21 MED ORDER — POLYVINYL ALCOHOL 1.4 % OP SOLN: 1.0000 [drp] | Freq: Four times a day (QID) | OPHTHALMIC | Status: DC | PRN

## 2022-06-21 MED ORDER — GLYCOPYRROLATE 0.2 MG/ML IJ SOLN: 0.2000 mg | INTRAMUSCULAR | Status: DC | PRN

## 2022-06-21 MED ORDER — GLYCOPYRROLATE 1 MG PO TABS: 1.0000 mg | ORAL_TABLET | ORAL | Status: DC | PRN

## 2022-06-21 MED ORDER — LORAZEPAM 2 MG/ML PO CONC
1.0000 mg | ORAL | Status: DC | PRN
  Administered 2022-06-21: 1 mg via ORAL
  Filled 2022-06-21: qty 1

## 2022-06-21 MED ORDER — ACETAMINOPHEN 650 MG RE SUPP: 650.0000 mg | Freq: Four times a day (QID) | RECTAL | Status: DC | PRN

## 2022-06-21 MED ORDER — SODIUM CHLORIDE 0.9 % IV SOLN: INTRAVENOUS | Status: DC

## 2022-06-21 MED ORDER — ACETAMINOPHEN 325 MG PO TABS
650.0000 mg | ORAL_TABLET | Freq: Four times a day (QID) | ORAL | Status: DC | PRN
  Administered 2022-06-22 – 2022-06-23 (×2): 650 mg via ORAL
  Filled 2022-06-21 (×2): qty 2

## 2022-06-21 NOTE — Progress Notes (Signed)
  Progress Note   Patient: Yvonne Curtis [id number] DOB: Sep 25, 1950 DOA: 06/20/2022     1 DOS: the patient was seen and examined on 06/21/2022   Brief hospital course: 72 year old female with a known history of diabetes, hypertension, CAD, PVD, Alzheimer's dementia with behavioral disturbances, depression, impaired functional mobility/bedbound status, chronic sacral pressure ulcers, Jehovah's Witness, CKD stage IIIb, anemia of CKD, 80 pound weight loss as documented in nephrology note on 05/07/2022 is admitted for acute metabolic encephalopathy  [id number]: Goals of care conversation with family.  Comfort care/hospice  Assessment and Plan: *Acute metabolic encephalopathy Likely multifactorial.  Acute delirium with underlying dementia from UTI  Sepsis secondary to UTI (HCC) Sinus tachycardia  Electrolyte abnormality Impaired functional mobility, balance, gait, and endurance Bedbound status, ADLs 6 out of 6 Chronic sacral/coccyx pressure ulcers Anemia in chronic kidney disease Chronic kidney disease, stage 3b (HCC) Controlled type 2 diabetes mellitus with stage 3 chronic kidney disease, without long-term current use of insulin (HCC) Gastro-esophageal reflux disease without esophagitis Benign hypertension Acute urinary retention.  Will place Foley for comfort     Subjective: Obtunded/getting agitated when waking up.  Husband at bedside  Physical Exam: Vitals:   06/20/22 2304 06/21/22 0032 06/21/22 0633 06/21/22 0804  BP: 117/63 120/72 (!) 126/57 110/61  Pulse: 97 100 92 88  Resp: 18 20 18 16  $ Temp: 99.1 F (37.3 C) 98.7 F (37.1 C) 98.8 F (37.1 C) 98.5 F (36.9 C)  TempSrc: Axillary     SpO2: 97% 96% 97% 98%  Weight:      Height:       72 year old obese female lying in the bed comfortably Lungs clear to auscultation bilaterally Heart regular rate and rhythm Abdomen soft, benign Neuro: Nonfocal.  She does not follow any commands, seems agitated when wakes up but  otherwise falls back to sleep Skin: Bilateral heel pressure ulcer present on admission.  Decubitus wound to coccyx/sacrum Data Reviewed:  Albumin 2.5, sodium 130, creatinine 1.54  Family Communication: Husband updated at bedside.  Daughter updated over phone  Disposition: Status is: Inpatient Remains inpatient appropriate because: Hospice evaluation.  Management of altered mental status and possible UTI  Planned Discharge Destination:  Home with hospice versus hospice home   DVT prophylaxis-heparin Time spent: 35 minutes  Author: Max Sane, MD 06/21/2022 9:52 AM  For on call review [email or web address].

## 2022-06-21 NOTE — IPAL (Signed)
  Interdisciplinary Goals of Care Family Meeting   Date carried out: 06/21/2022  Location of the meeting: Bedside  Member's involved: Physician and Family Member or next of kin  Durable Power of Attorney or Loss adjuster, chartered: Husband at bedside and Daughter Over phone    Discussion: We discussed goals of care for [employer]   Code status:   Code Status: DNR   Disposition: Home with Hospice vs Hospice Home  Time spent for the meeting: 35 mins    Max Sane, MD  06/21/2022, 9:23 AM

## 2022-06-21 NOTE — TOC Initial Note (Signed)
Transition of Care [facility]) - Initial/Assessment Note    Patient Details  Name: Yvonne Curtis MRN: [id number] Date of Birth: [birth year]  [facility]) CM/SW Contact:    Harriet Masson, RN Phone Number: [phone] 06/21/2022, 12:31 PM  Clinical Narrative:                 Spoke with spouse today concerning discharge disposition as pt evaluated via Hospice services this AM via [place] ([facility]). Plans for pt to return home with hospice services as indicated by the spouse [name]). Pt has all needed DME from previous services and will transition over to hospice provider and services upon her discharge. Pt has supportive family and spouse states he will be able to obtain her medications. DPR receptive to [facility] upon pt's transport when stable for discharge home. No other request or needs presented at this time.  TOC will continue to service according to pt's needs.  Expected Discharge Plan: Home w Hospice Care Barriers to Discharge: Continued Medical Work up   Patient Goals and CMS Choice   CMS Medicare.gov Compare Post Acute Care list provided to:: Patient Represenative (must comment) Choice offered to / list presented to : [place] ownership interest in [facility].provided to:: Spouse    Expected Discharge Plan and Services   Discharge Planning Services: CM Consult Post Acute Care Choice: Hospice (Home with hospice) Living arrangements for the past 2 months: Single Family Home                               Date [place]: 06/21/22 Time HH Agency Contacted: 1000 Representative spoke with at [place]: [place] Arrangements/Services Living arrangements for the past 2 months: [place] with:: Spouse Patient language and need for interpreter reviewed:: Yes Do you feel safe going back to the place where you live?: Yes      Need for Family Participation in Patient Care: Yes (Comment) Care giver  support system in place?: Yes (comment)   Criminal Activity/Legal Involvement Pertinent to Current Situation/Hospitalization: Yes - Comment as needed  Activities of Daily Living Home Assistive Devices/Equipment: Hospital bed ADL Screening (condition at time of admission) Patient's cognitive ability adequate to safely complete daily activities?: No Is the patient deaf or have difficulty hearing?: No Does the patient have difficulty seeing, even when wearing glasses/contacts?: No Does the patient have difficulty concentrating, remembering, or making decisions?: Yes Patient able to express need for assistance with ADLs?: No Does the patient have difficulty dressing or bathing?: Yes Independently performs ADLs?: No Communication: Needs assistance Is this a change from baseline?: Pre-admission baseline Dressing (OT): Dependent Is this a change from baseline?: Pre-admission baseline Grooming: Dependent Is this a change from baseline?: Pre-admission baseline Feeding: Dependent Is this a change from baseline?: Pre-admission baseline Bathing: Dependent Is this a change from baseline?: Pre-admission baseline Toileting: Dependent Is this a change from baseline?: Pre-admission baseline In/Out Bed: Dependent Is this a change from baseline?: Pre-admission baseline Walks in Home: Dependent Is this a change from baseline?: Pre-admission baseline Does the patient have difficulty walking or climbing stairs?: Yes Weakness of Legs: Both Weakness of Arms/Hands: None  Permission Sought/Granted Permission sought to share information with : Case Manager                Emotional Assessment Appearance:: Appears stated age Attitude/Demeanor/Rapport: Unable to Assess Affect (typically observed):  (Lethargic)  Alcohol / Substance Use: Not Applicable Psych Involvement: No (comment)  Admission diagnosis:  Confusion [R41.0] Sepsis, due to unspecified organism, unspecified whether acute organ  dysfunction present [facility]) [A41.9] Patient Active Problem List   Diagnosis Date Noted   Sepsis secondary to UTI ([place]) 06/20/2022   Electrolyte abnormality 06/20/2022   Sinus tachycardia by electrocardiogram 06/20/2022   Confusion 06/20/2022   Impaired functional mobility, balance, gait, and endurance 04/22/2022   Moderate late onset Alzheimer's dementia with mood disturbance ([place]) 12/20/2021   Seasonal allergic rhinitis 12/20/2021   Moderate protein-calorie malnutrition (HCC) 08/20/2021   Moderate episode of recurrent major depressive disorder ([place]) 08/20/2021   Cyst of kidney, acquired 07/04/2021   Anemia in chronic kidney disease 02/07/2021   Pure hypercholesterolemia, unspecified 02/07/2021   Hypercalcemia 10/11/2020   Chronic kidney disease, stage 3b ([place]) 09/04/2020   Dysphagia    History of colonic polyps    Polyp of transverse colon    Abnormal sense of taste 12/19/2019   Loss of memory 12/19/2019   Mood changes 12/19/2019   Acanthosis nigricans 11/14/2014   Benign hypertension 11/14/2014   Dyslipidemia 11/14/2014   Gastro-esophageal reflux disease without esophagitis 11/14/2014   Morbid obesity ([place]) 11/14/2014   Primary osteoarthritis of both knees 11/14/2014   Microalbuminuria 10/15/2012   Controlled type 2 diabetes mellitus with stage 3 chronic kidney disease, without long-term current use of insulin ([place]) 06/05/2009   Body dermatophytosis 12/13/2008   PCP:  Steele Sizer, MD Pharmacy:   [facility] [address], [place] [address] Phone: [phone] Fax: [phone]  [facility], [place] [address] Phone: [phone] Fax: [phone]     Social Determinants of Health (SDOH) Social History: SDOH Screenings   Food Insecurity: No Food Insecurity (06/21/2022)  Housing: Low Risk  (06/21/2022)  Transportation Needs: No  Transportation Needs (06/21/2022)  Utilities: Not At Risk (06/21/2022)  Alcohol Screen: Low Risk  (04/22/2022)  Depression (PHQ2-9): High Risk (04/22/2022)  Financial Resource Strain: Low Risk  (04/18/2021)  Physical Activity: Inactive (04/18/2021)  Social Connections: Socially Integrated (04/18/2021)  Stress: Stress Concern Present (04/18/2021)  Tobacco Use: Low Risk  (06/20/2022)   SDOH Interventions:     Readmission Risk Interventions     No data to display

## 2022-06-21 NOTE — Plan of Care (Signed)
  Problem: Respiratory: Goal: Ability to maintain adequate ventilation will improve Outcome: Progressing   Problem: Elimination: Goal: Will not experience complications related to bowel motility Outcome: Progressing Goal: Will not experience complications related to urinary retention Outcome: Progressing   Problem: Pain Managment: Goal: General experience of comfort will improve Outcome: Progressing   Problem: Safety: Goal: Ability to remain free from injury will improve Outcome: Progressing

## 2022-06-21 NOTE — Hospital Course (Signed)
72 year old female with a known history of diabetes, hypertension, CAD, PVD, Alzheimer's dementia with behavioral disturbances, depression, impaired functional mobility/bedbound status, chronic sacral pressure ulcers, Jehovah's Witness, CKD stage IIIb, anemia of CKD, 80 pound weight loss as documented in nephrology note on 05/07/2022 is admitted for acute metabolic encephalopathy  [id number]: Goals of care conversation with family.  Comfort care/hospice 2/18: Hospice at home, plan for discharge tomorrow

## 2022-06-21 NOTE — Progress Notes (Signed)
[facility])  Hospital Liaison RN note  Received request from [facility] for hospice services at home after discharge. Chart and patient information under review by Hospice physician.   Spoke with [name] and [name] to initiate education related to hospice philosophy, services, and team approach to care. Patient/family verbalized understanding of information given. Per discussion, the plan is for discharge home by EMS on Monday 2.17.   DME needs discussed. Patient has the following equipment in the home. Hospital bed, [facility] lift, and wheelchair. Family reports no immediate DME needs.  Please send signed and completed DNR with patient/family. Please provide prescriptions at discharge as needed for ongoing symptom management.   Please call with any hospice related questions or concerns.  Thank you for the opportunity to participate in this patient's care.  Jhonnie Garner, Therapist, sports, BSN [employer] [phone]

## 2022-06-22 DIAGNOSIS — A419 Sepsis, unspecified organism: Secondary | ICD-10-CM | POA: Diagnosis not present

## 2022-06-22 DIAGNOSIS — R410 Disorientation, unspecified: Secondary | ICD-10-CM | POA: Diagnosis not present

## 2022-06-22 DIAGNOSIS — Z7409 Other reduced mobility: Secondary | ICD-10-CM | POA: Diagnosis not present

## 2022-06-22 DIAGNOSIS — Z515 Encounter for palliative care: Secondary | ICD-10-CM | POA: Diagnosis not present

## 2022-06-22 NOTE — Progress Notes (Signed)
  Progress Note   Patient: Yvonne Curtis [id number] DOB: [birth year] DOA: 06/20/2022     2 DOS: the patient was seen and examined on 06/22/2022   Brief hospital course: 72 year old female with a known history of diabetes, hypertension, CAD, PVD, Alzheimer's dementia with behavioral disturbances, depression, impaired functional mobility/bedbound status, chronic sacral pressure ulcers, Jehovah's Witness, CKD stage IIIb, anemia of CKD, 80 pound weight loss as documented in nephrology note on 05/07/2022 is admitted for acute metabolic encephalopathy  [id number]: Goals of care conversation with family.  Comfort care/hospice 2/18: Hospice at home, plan for discharge tomorrow  Assessment and Plan:  *Acute metabolic encephalopathy Likely multifactorial.  Acute delirium with underlying dementia from UTI   Sepsis secondary to UTI (HCC)-on antibiotics Sinus tachycardia  Electrolyte abnormality Impaired functional mobility, balance, gait, and endurance Bedbound status, ADLs 6 out of 6 Chronic sacral/coccyx pressure ulcers Anemia in chronic kidney disease Chronic kidney disease, stage 3b (HCC) Controlled type 2 diabetes mellitus with stage 3 chronic kidney disease, without long-term current use of insulin (HCC) Gastro-esophageal reflux disease without esophagitis Benign hypertension Acute urinary retention.  Indwelling Foley for comfort      Subjective: Remains comfortable  Physical Exam: Vitals:   06/20/22 2304 06/21/22 0032 06/21/22 0633 06/21/22 0804  BP: 117/63 120/72 (!) 126/57 110/61  Pulse: 97 100 92 88  Resp: 18 20 18 16  $ Temp: 99.1 F (37.3 C) 98.7 F (37.1 C) 98.8 F (37.1 C) 98.5 F (36.9 C)  TempSrc: Axillary     SpO2: 97% 96% 97% 98%  Weight:      Height:       72 year old obese female lying in the bed comfortably Lungs clear to auscultation bilaterally Heart regular rate and rhythm Abdomen soft, benign Neuro: Nonfocal.  She does not follow any commands, seems  agitated when wakes up but otherwise falls back to sleep Skin: Bilateral heel pressure ulcer present on admission.  Decubitus wound to coccyx/sacrum Data Reviewed:  There are no new results to review at this time.  Family Communication: Husband at bedside  Disposition: Status is: Inpatient Remains inpatient appropriate because: Comfort care.  Plan for discharge tomorrow-Home with hospice  Planned Discharge Destination:  Home with hospice   DVT prophylaxis-SCDs Time spent: 15 minutes  Author: Max Sane, MD 06/22/2022 10:05 AM  For on call review [email or web address].

## 2022-06-22 NOTE — Progress Notes (Signed)
[facility])   Plan remains for patient to discharge via [facility] once medically clear (goal is 2.19.24).   If applicable, please send signed and completed DNR with patient/family upon discharge. Please provide prescriptions at discharge as needed to ensure ongoing symptom management and a transport packet.   [facility] information and contact numbers given to family and above information shared with TOC.    Please call with any questions/concerns.    Thank you for the opportunity to participate in this patient's care   Phillis Haggis, MSW [facility] Liaison  [phone]

## 2022-06-22 NOTE — Consult Note (Signed)
[facility] Nurse Consult Note: Reason for Consult:Chronic, Stage 2 (Partial Thickness) pressure injury to sacrum, Stage 1 pressure injury to right heel (non blanching erythema) and stage 2 pressure injury to left heel (intact, serum filled blister) Wound type:pressure Pressure Injury POA: Yes Measurement:To be measured by Bedside RN with next dressing change and documented on Nursing Flow Sheet Wound bed:As noted above. Stage 2 PI with moist pink wound bed. Drainage (amount, consistency, odor) scant serous  Periwound:intact, dry Dressing procedure/placement/frequency:I have provided guidance for care of the sacral wound using a silicone foam dressing and every other day changes in conjunction with turning and repositioning. The bilateral heels are provided with Prevalon heel boots for home use. Topical care for the heels can be floatation; I have added a xeroform dressing changed daily to the left heel and painting of the right heel with a povidone-iodine swabstick while in house.  Patient is expected to discharge home with Hospice on 06/23/22.  [place] nursing team will not follow, but will remain available to this patient, the nursing and medical teams.  Please re-consult if needed.  Thank you for inviting [place] to participate in this patient's Plan of Care.  Maudie Flakes, MSN, RN, CNS, [place], Serita Grammes, [employer], [employer] phone:  [phone]

## 2022-06-22 NOTE — Telephone Encounter (Signed)
  Care Management   Follow Up Note    Name: Yvonne Curtis MRN: [id number] DOB: 1951/03/19   Referred by: Steele Sizer, MD Reason for referral : Care Coordination   Follow up regarding plan for home health/nursing.  Attempted outreach with [name]'s spouse/caregiver to confirm call was received from [facility] for start of care. Unable to complete conversation d/t patient's sister requesting that we discuss home health at a later time.  Follow up call placed to [name]'s daughter [name]. Reports virtual visit with PCP was completed  earlier today, however they have not been contacted by [facility]. Requested urgent outreach if possible d/t worsening wound. They are currently unable to transport patient for evaluation d/t patient being bed bound.  Call placed to [facility]. Team confirmed they would attempt a nursing visit later today if staffing permits. If unable to complete today, they will complete initial visit tomorrow.   Follow Up Plan:  A member of the care management team will follow up tomorrow    [place] Manager/Chronic Care Management [phone]

## 2022-06-22 NOTE — Progress Notes (Signed)
Chaplain responded to [facility] for EOL.  Cousin ([place]) and her husband were bedside.  Pt (Jehovah's Witness) somewhat responsive to greeting, but when offered, clearly welcomed prayer. Family appreciated visited.  Please contact as needed for ongoing support.  Minus [place], [place] Pager:  [phone]    06/22/22 0800  Spiritual Encounters  Type of Visit Initial  Care provided to: Patient  Conversation partners present during encounter Other (comment) (family)  Referral source Other (comment) (Spiritual consult)  Reason for visit End-of-life  OnCall Visit Yes  Spiritual Framework  Patient Stress Factors Major life changes  Family Stress Factors Major life changes  Interventions  Spiritual Care Interventions Made Prayer;Encouragement;Supported grief process

## 2022-06-23 ENCOUNTER — Telehealth: Payer: Self-pay

## 2022-06-23 ENCOUNTER — Ambulatory Visit: Payer: HMO | Admitting: Family Medicine

## 2022-06-23 DIAGNOSIS — R000 Tachycardia, unspecified: Secondary | ICD-10-CM

## 2022-06-23 MED ORDER — SENNOSIDES-DOCUSATE SODIUM 8.6-50 MG PO TABS
2.0000 | ORAL_TABLET | ORAL | Status: AC
  Administered 2022-06-23: 2 via ORAL
  Filled 2022-06-23: qty 2

## 2022-06-23 MED ORDER — POLYETHYLENE GLYCOL 3350 17 G PO PACK
17.0000 g | PACK | ORAL | Status: AC
  Administered 2022-06-23: 17 g via ORAL
  Filled 2022-06-23: qty 1

## 2022-06-23 NOTE — TOC Transition Note (Signed)
Transition of Care [facility]) - CM/SW Discharge Note   Patient Details  Name: Yvonne Curtis MRN: [id number] Date of Birth: 06-14-1950  [facility]) CM/SW Contact:  Gerilyn Pilgrim, LCSW Phone Number: 06/23/2022, 9:18 AM   Clinical Narrative:   Pt has orders to discharge home with [facility] hospice medical necessity printed to the unit. RN to call family to notify of transport. [facility] called patient is next in line. CSW signing off.      Final next level of care: Home w Hospice Care Barriers to Discharge: Barriers Resolved   Patient Goals and CMS Choice CMS Medicare.gov Compare Post Acute Care list provided to:: Patient Represenative (must comment) Choice offered to / list presented to : [facility] POA / Guardian  Discharge Placement                  Patient to be transferred to facility by: [facility]      Discharge Plan and Services Additional resources added to the After Visit Summary for     Discharge Planning Services: CM Consult Post Acute Care Choice: Hospice (Home with hospice)                        Date [facility] Agency Contacted: 06/21/22 Time HH Agency Contacted: 1000 Representative spoke with at [place]: [place] Determinants of Health ([place]) Interventions SDOH Screenings   Food Insecurity: No Food Insecurity (06/21/2022)  Housing: Low Risk  (06/21/2022)  Transportation Needs: No Transportation Needs (06/21/2022)  Utilities: Not At Risk (06/21/2022)  Alcohol Screen: Low Risk  (04/22/2022)  Depression (PHQ2-9): High Risk (04/22/2022)  Financial Resource Strain: Low Risk  (04/18/2021)  Physical Activity: Inactive (04/18/2021)  Social Connections: Socially Integrated (04/18/2021)  Stress: Stress Concern Present (04/18/2021)  Tobacco Use: Low Risk  (06/20/2022)     Readmission Risk Interventions     No data to display

## 2022-06-23 NOTE — Discharge Summary (Signed)
Physician Discharge Summary   Patient: Yvonne Curtis MRN: [id number] DOB: 03-23-1951  Admit date:     06/20/2022  Discharge date: 06/23/2022  Discharge Physician: Max Sane   PCP: Steele Sizer, MD   Recommendations at discharge:   Hospice at home  Discharge Diagnoses: Principal Problem:   Confusion Active Problems:   Sepsis secondary to UTI [facility])   Sinus tachycardia by electrocardiogram   Benign hypertension   Gastro-esophageal reflux disease without esophagitis   Controlled type 2 diabetes mellitus with stage 3 chronic kidney disease, without long-term current use of insulin (HCC)   Dysphagia   Chronic kidney disease, stage 3b (HCC)   Anemia in chronic kidney disease   Impaired functional mobility, balance, gait, and endurance   Electrolyte abnormality  Hospital Course: 72 year old female with a known history of diabetes, hypertension, CAD, PVD, Alzheimer's dementia with behavioral disturbances, depression, impaired functional mobility/bedbound status, chronic sacral pressure ulcers, Jehovah's Witness, CKD stage IIIb, anemia of CKD, 80 pound weight loss as documented in nephrology note on 05/07/2022 is admitted for acute metabolic encephalopathy  [id number]: Goals of care conversation with family.  Comfort care/hospice 2/18: Hospice at home, plan for discharge tomorrow  Assessment and Plan: *Acute metabolic encephalopathy Likely multifactorial.  Acute delirium with underlying dementia from UTI   Sepsis secondary to UTI (HCC)-on antibiotics Sinus tachycardia  Electrolyte abnormality Impaired functional mobility, balance, gait, and endurance Bedbound status, ADLs 6 out of 6 Chronic sacral/coccyx pressure ulcers Anemia in chronic kidney disease Chronic kidney disease, stage 3b (HCC) Controlled type 2 diabetes mellitus with stage 3 chronic kidney disease, without long-term current use of insulin (HCC) Gastro-esophageal reflux disease without esophagitis Benign  hypertension Acute urinary retention.  Indwelling Foley for comfort, husband requested Foley to be removed before discharge         Disposition: Hospice care Diet recommendation:  Discharge Diet Orders (From admission, onward)     Start     Ordered   06/23/22 0000  Diet - low sodium heart healthy        06/23/22 0843           Carb modified diet DISCHARGE MEDICATION: Allergies as of 06/23/2022       Reactions   Oxycodone Other (See Comments)   Oxycodone Hcl    insomnia, agitation        Medication List     STOP taking these medications    doxycycline 100 MG capsule Commonly known as: VIBRAMYCIN       TAKE these medications    amLODipine 2.5 MG tablet Commonly known as: NORVASC Take 1 tablet by mouth once daily   blood glucose meter kit and supplies Dispense based on patient and insurance preference. Use once daily as directed. (FOR ICD-10 E10.9, E11.9). STrips 100 Lancets 100   divalproex 125 MG capsule Commonly known as: DEPAKOTE SPRINKLE Take 1 capsule by mouth twice daily   mirtazapine 15 MG disintegrating tablet Commonly known as: REMERON SOL-TAB Take 1 tablet (15 mg total) by mouth at bedtime.   rivastigmine 1.5 MG capsule Commonly known as: EXELON Take 1.5 mg by mouth 2 (two) times daily.   telmisartan 80 MG tablet Commonly known as: MICARDIS Take 1 tablet (80 mg total) by mouth daily.               Discharge Care Instructions  (From admission, onward)           Start     Ordered   06/23/22 0000  Discharge wound  care:       Comments: As above   06/23/22 0843            Discharge Exam: Filed Weights   06/20/22 1937  Weight: 85.15 kg   72 year old obese female lying in the bed comfortably Lungs clear to auscultation bilaterally Heart regular rate and rhythm Abdomen soft, benign Neuro: Nonfocal.  She does not follow commands, intermittent agitation Skin: Bilateral heel pressure ulcer present on admission.   Decubitus wound to coccyx/sacrum  Condition at discharge: poor  The results of significant diagnostics from this hospitalization (including imaging, microbiology, ancillary and laboratory) are listed below for reference.   Imaging Studies: CT HEAD WO CONTRAST (5MM)  Result Date: 06/20/2022 CLINICAL DATA:  Altered level of consciousness EXAM: CT HEAD WITHOUT CONTRAST TECHNIQUE: Contiguous axial images were obtained from the base of the skull through the vertex without intravenous contrast. RADIATION DOSE REDUCTION: This exam was performed according to the departmental dose-optimization program which includes automated exposure control, adjustment of the mA and/or kV according to patient size and/or use of iterative reconstruction technique. COMPARISON:  05/26/2022 FINDINGS: Evaluation limited due to patient motion throughout the exam. Brain: No acute infarct or hemorrhage. Lateral ventricles and midline structures are unremarkable. No acute extra-axial fluid collections. No mass effect. Vascular: No hyperdense vessel or unexpected calcification. Skull: Normal. Negative for fracture or focal lesion. Sinuses/Orbits: No acute finding. Other: None. IMPRESSION: 1. Limited study due to patient motion. 2. No acute intracranial process. Electronically Signed   By: Randa Ngo M.D.   On: 06/20/2022 22:28   DG Chest Port 1 View  Result Date: 06/20/2022 CLINICAL DATA:  Altered level of consciousness, dementia, decubitus ulcers, possible sepsis EXAM: PORTABLE CHEST 1 VIEW COMPARISON:  05/26/2022 FINDINGS: Single frontal view of the chest demonstrates an unremarkable cardiac silhouette. No airspace disease, effusion, or pneumothorax. No acute bony abnormalities. IMPRESSION: 1. No acute intrathoracic process. Electronically Signed   By: Randa Ngo M.D.   On: 06/20/2022 20:26   CT HEAD WO CONTRAST (5MM)  Result Date: 05/26/2022 CLINICAL DATA:  Patient with history of dementia, presenting with left leg pain  and inability to walk x2 days, per the patient's family. EXAM: CT HEAD WITHOUT CONTRAST TECHNIQUE: Contiguous axial images were obtained from the base of the skull through the vertex without intravenous contrast. RADIATION DOSE REDUCTION: This exam was performed according to the departmental dose-optimization program which includes automated exposure control, adjustment of the mA and/or kV according to patient size and/or use of iterative reconstruction technique. COMPARISON:  None Available. FINDINGS: Brain: There is mild to moderate severity cerebral atrophy with widening of the extra-axial spaces and ventricular dilatation. There are areas of decreased attenuation within the white matter tracts of the supratentorial brain, consistent with microvascular disease changes. Vascular: No hyperdense vessel or unexpected calcification. Skull: Normal. Negative for fracture or focal lesion. Sinuses/Orbits: No acute finding. Other: None. IMPRESSION: 1. No acute intracranial abnormality. 2. Cerebral atrophy and microvascular disease changes of the supratentorial brain. Electronically Signed   By: Virgina Norfolk M.D.   On: 05/26/2022 19:58   CT Cervical Spine Wo Contrast  Result Date: 05/26/2022 CLINICAL DATA:  Cervical radiculopathy. EXAM: CT CERVICAL SPINE WITHOUT CONTRAST TECHNIQUE: Multidetector CT imaging of the cervical spine was performed without intravenous contrast. Multiplanar CT image reconstructions were also generated. RADIATION DOSE REDUCTION: This exam was performed according to the departmental dose-optimization program which includes automated exposure control, adjustment of the mA and/or kV according to patient size and/or use  of iterative reconstruction technique. COMPARISON:  CT chest 07/19/2021 FINDINGS: Alignment: Normal. Skull base and vertebrae: No acute fracture. No primary bone lesion or focal pathologic process. Soft tissues and spinal canal: No prevertebral fluid or swelling. No visible  canal hematoma. Disc levels: There is intervertebral disc space narrowing and endplate osteophyte formation throughout the cervical spine compatible with degenerative change. There is also mild diffuse bilateral facet arthropathy. C2-C3: No central canal or neural foraminal stenosis. C3-C4: Mild right neural foraminal stenosis. No central canal stenosis. C4-C5: No significant central canal or neural foraminal stenosis. C5-C6 bilateral uncovertebral spurring causes mild neural foraminal stenosis. No central canal stenosis. C6-C7 posterior disc osteophyte complex and uncovertebral spurring causes severe bilateral neural foraminal stenosis and mild central canal stenosis. Upper chest: Clear. Other: Markedly enlarged left thyroid measuring 5.4 by 4.6 cm. Coarse calcifications are present. Focal nodule identified measuring 4.4 by 2.7 cm. Appearance is unchanged. IMPRESSION: 1. Mild-to-moderate degenerative changes of the cervical spine. 2. Severe bilateral neural foraminal stenosis and mild central canal stenosis at C6-C7. 3. Unchanged 4.4 cm incidental thyroid nodule. Recommend non-emergent thyroid ultrasound. Reference: J Am Coll Radiol. 2015 Feb;12(2): 143-50 Electronically Signed   By: Ronney Asters M.D.   On: 05/26/2022 19:58   DG Chest Portable 1 View  Result Date: 05/26/2022 CLINICAL DATA:  Weakness. EXAM: PORTABLE CHEST 1 VIEW COMPARISON:  X-ray 07/19/2021 FINDINGS: Enlarged cardiopericardial silhouette with vascular congestion. Subtle additional interstitial changes. Mild edema versus infiltrate. Recommend follow-up. No pneumothorax or effusion. Tortuous and ectatic aorta. Underinflation. IMPRESSION: Enlarged cardiopericardial silhouette with vascular congestion and some interstitial changes. Subtle edema versus infiltrate. Recommend follow-up Electronically Signed   By: Jill Side M.D.   On: 05/26/2022 17:55   US Venous Img Lower  Left (DVT Study)  Result Date: 05/26/2022 CLINICAL DATA:  Left lower  extremity pain, difficulty walking EXAM: LEFT LOWER EXTREMITY VENOUS DOPPLER ULTRASOUND TECHNIQUE: Gray-scale sonography with graded compression, as well as color Doppler and duplex ultrasound were performed to evaluate the lower extremity deep venous systems from the level of the common femoral vein and including the common femoral, femoral, profunda femoral, popliteal and calf veins including the posterior tibial, peroneal and gastrocnemius veins when visible. Spectral Doppler was utilized to evaluate flow at rest and with distal augmentation maneuvers in the common femoral, femoral and popliteal veins. COMPARISON:  None Available. FINDINGS: Contralateral Common Femoral Vein: Respiratory phasicity is normal and symmetric with the symptomatic side. No evidence of thrombus. Normal compressibility. Common Femoral Vein: No evidence of thrombus. Normal compressibility, respiratory phasicity and response to augmentation. Saphenofemoral Junction: No evidence of thrombus. Normal compressibility and flow on color Doppler imaging. Profunda Femoral Vein: No evidence of thrombus. Normal compressibility and flow on color Doppler imaging. Femoral Vein: No evidence of thrombus. Normal compressibility, respiratory phasicity and response to augmentation. Popliteal Vein: No evidence of thrombus. Normal compressibility, respiratory phasicity and response to augmentation. Calf Veins: No evidence of thrombus. Normal compressibility and flow on color Doppler imaging. IMPRESSION: No evidence of deep venous thrombosis. Electronically Signed   By: Jerilynn Mages.  Shick M.D.   On: 05/26/2022 16:39   DG Knee Complete 4 Views Left  Result Date: 05/26/2022 CLINICAL DATA:  Pain, inability to walk. EXAM: LEFT KNEE - COMPLETE 4+ VIEW COMPARISON:  None Available. FINDINGS: Joint effusion is present. There is no acute fracture or dislocation. There is tricompartmental osteophyte formation and severe narrowing of the medial compartment. No focal osseous  lesion identified. Large superior patellar spur is noted. IMPRESSION: 1.  No acute fracture or dislocation. 2. Tricompartmental osteoarthritis, severe in the medial compartment. Electronically Signed   By: Ronney Asters M.D.   On: 05/26/2022 16:18   DG Hip Unilat With Pelvis 2-3 Views Left  Result Date: 05/26/2022 CLINICAL DATA:  Pain. Unable to walk. No reported history of trauma. EXAM: DG HIP (WITH OR WITHOUT PELVIS) 3V LEFT COMPARISON:  None Available. FINDINGS: No fracture or dislocation. Osteopenia. Hyperostosis. Slight concentric joint space loss of the hips. Degenerative changes along the lumbar spine. IMPRESSION: Mild degenerative changes. Hyperostosis. No acute osseous abnormality Electronically Signed   By: Jill Side M.D.   On: 05/26/2022 16:17    Microbiology: Results for orders placed or performed during the hospital encounter of 06/20/22  Blood Culture (routine x 2)     Status: None (Preliminary result)   Collection Time: 06/20/22  7:49 PM   Specimen: BLOOD  Result Value Ref Range Status   Specimen Description BLOOD BLOOD RIGHT WRIST  Final   Special Requests   Final    BOTTLES DRAWN AEROBIC AND ANAEROBIC Blood Culture results may not be optimal due to an inadequate volume of blood received in culture bottles   Culture   Final    NO GROWTH 3 DAYS Performed at [facility], [address] [place], [place] [address]    Report Status PENDING  Incomplete  Resp panel by RT-PCR (RSV, Flu A&B, Covid) Urine, Catheterized     Status: None   Collection Time: 06/20/22  7:52 PM   Specimen: Urine, Catheterized; Nasal Swab  Result Value Ref Range Status   SARS Coronavirus 2 by RT PCR NEGATIVE NEGATIVE Final    Comment: (NOTE) SARS-CoV-2 target nucleic acids are NOT DETECTED.  The SARS-CoV-2 RNA is generally detectable in upper respiratory specimens during the acute phase of infection. The lowest concentration of SARS-CoV-2 viral copies this assay can detect is 138  copies/mL. A negative result does not preclude SARS-Cov-2 infection and should not be used as the sole basis for treatment or other patient management decisions. A negative result may occur with  improper specimen collection/handling, submission of specimen other than nasopharyngeal swab, presence of viral mutation(s) within the areas targeted by this assay, and inadequate number of viral copies(<138 copies/mL). A negative result must be combined with clinical observations, patient history, and epidemiological information. The expected result is Negative.  Fact Sheet for Patients:  EntrepreneurPulse.com.au  Fact Sheet for Healthcare Providers:  IncredibleEmployment.be  This test is no t yet approved or cleared by the [place] FDA and  has been authorized for detection and/or diagnosis of SARS-CoV-2 by FDA under an Emergency Use Authorization (EUA). This EUA will remain  in effect (meaning this test can be used) for the duration of the COVID-19 declaration under Section 564(b)(1) of the Act, 21 U.S.C.section 360bbb-3(b)(1), unless the authorization is terminated  or revoked sooner.       Influenza A by PCR NEGATIVE NEGATIVE Final   Influenza B by PCR NEGATIVE NEGATIVE Final    Comment: (NOTE) The Xpert Xpress SARS-CoV-2/FLU/RSV plus assay is intended as an aid in the diagnosis of influenza from Nasopharyngeal swab specimens and should not be used as a sole basis for treatment. Nasal washings and aspirates are unacceptable for Xpert Xpress SARS-CoV-2/FLU/RSV testing.  Fact Sheet for Patients: EntrepreneurPulse.com.au  Fact Sheet for Healthcare Providers: IncredibleEmployment.be  This test is not yet approved or cleared by the [place] FDA and has been authorized for detection and/or diagnosis of SARS-CoV-2 by FDA  under an Emergency Use Authorization (EUA). This EUA will remain in effect (meaning  this test can be used) for the duration of the COVID-19 declaration under Section 564(b)(1) of the Act, 21 U.S.C. section 360bbb-3(b)(1), unless the authorization is terminated or revoked.     Resp Syncytial Virus by PCR NEGATIVE NEGATIVE Final    Comment: (NOTE) Fact Sheet for Patients: EntrepreneurPulse.com.au  Fact Sheet for Healthcare Providers: IncredibleEmployment.be  This test is not yet approved or cleared by the [place] FDA and has been authorized for detection and/or diagnosis of SARS-CoV-2 by FDA under an Emergency Use Authorization (EUA). This EUA will remain in effect (meaning this test can be used) for the duration of the COVID-19 declaration under Section 564(b)(1) of the Act, 21 U.S.C. section 360bbb-3(b)(1), unless the authorization is terminated or revoked.  Performed at [facility], [address] [place], [place] [address]   Blood Culture (routine x 2)     Status: None (Preliminary result)   Collection Time: 06/20/22  7:54 PM   Specimen: BLOOD  Result Value Ref Range Status   Specimen Description BLOOD BLOOD LEFT HAND  Final   Special Requests   Final    BOTTLES DRAWN AEROBIC AND ANAEROBIC Blood Culture results may not be optimal due to an inadequate volume of blood received in culture bottles   Culture   Final    NO GROWTH 3 DAYS Performed at [facility], [address] [place], [place] [address]    Report Status PENDING  Incomplete    Labs: CBC: Recent Labs  Lab 06/20/22 1949  WBC 22.9*  NEUTROABS 19.6*  HGB 10.4*  HCT 33.0*  MCV 85.7  PLT 123456*   Basic Metabolic Panel: Recent Labs  Lab 06/20/22 1949  NA 130*  K 4.5  CL 97*  CO2 22  GLUCOSE 110*  BUN 35*  CREATININE 1.54*  CALCIUM 9.2   Liver Function Tests: Recent Labs  Lab 06/20/22 1949  AST 36  ALT 30  ALKPHOS 72  BILITOT 1.0  PROT 6.2*  ALBUMIN 2.5*   CBG: No results for input(s): "GLUCAP" in the last  168 hours.  Discharge time spent: greater than 30 minutes.  Signed: Max Sane, MD [facility] 06/23/2022

## 2022-06-23 NOTE — Progress Notes (Signed)
Nutrition Brief Note  Chart reviewed. Pt now transitioning to comfort care. Per MD notes, plan to discharge home with hospice services today. No further nutrition interventions planned at this time.  Please re-consult as needed.   Loistine Chance, RD, LDN, [place] Registered Dietitian II Certified Diabetes Care and Education Specialist Please refer to [facility] for RD and/or RD on-call/weekend/after hours pager

## 2022-06-23 NOTE — Telephone Encounter (Signed)
Care Management   Visit Note  06/23/2022 Name: JAYLE LOBLEY MRN: [id number] DOB: 10-02-1950  Subjective: ANNAY BENAK is a 72 y.o. year old female who is a primary care patient of Alba Cory, MD. The Care Management team was consulted for assistance.      Message received from clinic team regarding resources. Patient status has significantly declined. Brief outreach with patient's daughter/Monica. Reports patient's spouse is currently serving as the primary caregiver and family members are assisting however she would benefit from care around the clock. Will collaborate with SW team regarding availability of additional in home services versus facility placement.   [facility]/Care Management [phone]

## 2022-06-23 NOTE — Care Management Important Message (Signed)
Important Message  Patient Details  Name: LALIAH LICHTE MRN: [id number] Date of Birth: [birth year]   Medicare Important Message Given:  N/A - LOS <3 / Initial given by admissions     Juliann Pulse A Medea Deines 06/23/2022, 7:38 AM

## 2022-06-24 ENCOUNTER — Ambulatory Visit: Payer: HMO

## 2022-06-24 NOTE — Telephone Encounter (Signed)
[facility] sent letter informing us. Signed and faxed back.

## 2022-06-25 ENCOUNTER — Telehealth: Payer: Self-pay

## 2022-06-25 NOTE — Telephone Encounter (Signed)
Paperwork from [facility] was faxed on 06/25/22 @ 5:08pm

## 2022-06-25 NOTE — Telephone Encounter (Signed)
Care Management   Visit Note  06/25/2022 Name: Yvonne Curtis MRN: [id number] DOB: 06-07-1950  Subjective: Yvonne Curtis is a 72 y.o. year old female who is a primary care patient of Alba Cory, MD. The Care Management team was consulted for assistance.      Engaged with patient's daughter Sarita via telephone. Reports [name] will transition to [facility]. Will contact the clinic if her status changes and additional assistance is required.  Plan:  Patient transitioned to Hospice Care  [facility]/Care Management [phone]

## 2022-06-25 NOTE — Telephone Encounter (Signed)
Copied from [place] [id number]. Topic: General - Other >> Jun 25, 2022  3:01 PM Ja-Kwan M wrote: Reason for CRM: Doris with [facility] called for an update on the five orders that were faxed. Doris stated they received one order back but it was signed by Dr. Ancil Boozer so they will need the order to include Dr. Ancil Boozer NPI# since the order was originally sent to [facility] Cb# [id number]

## 2022-06-26 ENCOUNTER — Ambulatory Visit: Payer: HMO | Admitting: Family Medicine

## 2022-06-26 ENCOUNTER — Ambulatory Visit: Payer: HMO

## 2022-06-26 LAB — CULTURE, BLOOD (ROUTINE X 2)
Culture: NO GROWTH
Culture: NO GROWTH

## 2022-07-01 ENCOUNTER — Ambulatory Visit: Payer: HMO

## 2022-07-03 ENCOUNTER — Ambulatory Visit: Payer: HMO

## 2022-07-07 ENCOUNTER — Ambulatory Visit: Payer: HMO | Admitting: Internal Medicine

## 2022-07-08 ENCOUNTER — Ambulatory Visit: Payer: HMO

## 2022-07-10 ENCOUNTER — Ambulatory Visit: Payer: HMO

## 2022-07-15 ENCOUNTER — Ambulatory Visit: Payer: HMO

## 2022-07-17 ENCOUNTER — Ambulatory Visit: Payer: HMO

## 2022-07-22 ENCOUNTER — Ambulatory Visit: Payer: HMO

## 2022-07-24 ENCOUNTER — Ambulatory Visit: Payer: HMO

## 2022-07-29 ENCOUNTER — Ambulatory Visit: Payer: HMO

## 2022-07-31 ENCOUNTER — Ambulatory Visit: Payer: HMO

## 2022-08-04 DEATH — deceased

## 2022-08-05 ENCOUNTER — Ambulatory Visit: Payer: HMO

## 2022-08-07 ENCOUNTER — Ambulatory Visit: Payer: HMO

## 2022-08-12 ENCOUNTER — Ambulatory Visit: Payer: HMO

## 2022-08-14 ENCOUNTER — Ambulatory Visit: Payer: HMO

## 2022-08-19 ENCOUNTER — Ambulatory Visit: Payer: HMO

## 2022-08-21 ENCOUNTER — Ambulatory Visit: Payer: HMO

## 2022-08-26 ENCOUNTER — Ambulatory Visit: Payer: HMO

## 2022-08-28 ENCOUNTER — Ambulatory Visit: Payer: HMO

## 2022-09-02 ENCOUNTER — Ambulatory Visit: Payer: HMO

## 2022-09-04 ENCOUNTER — Ambulatory Visit: Payer: HMO

## 2022-09-09 ENCOUNTER — Ambulatory Visit: Payer: HMO

## 2022-09-11 ENCOUNTER — Ambulatory Visit: Payer: HMO

## 2022-09-16 ENCOUNTER — Ambulatory Visit: Payer: HMO

## 2022-09-18 ENCOUNTER — Ambulatory Visit: Payer: HMO

## 2022-09-23 ENCOUNTER — Ambulatory Visit: Payer: HMO

## 2022-09-25 ENCOUNTER — Ambulatory Visit: Payer: HMO

## 2022-09-30 ENCOUNTER — Ambulatory Visit: Payer: HMO

## 2022-10-02 ENCOUNTER — Ambulatory Visit: Payer: HMO

## 2022-10-07 ENCOUNTER — Ambulatory Visit: Payer: HMO

## 2022-10-09 ENCOUNTER — Ambulatory Visit: Payer: HMO

## 2022-10-14 ENCOUNTER — Ambulatory Visit: Payer: HMO

## 2022-10-16 ENCOUNTER — Ambulatory Visit: Payer: HMO

## 2022-10-21 ENCOUNTER — Ambulatory Visit: Payer: HMO

## 2022-10-23 ENCOUNTER — Ambulatory Visit: Payer: HMO

## 2023-04-30 ENCOUNTER — Ambulatory Visit: Payer: HMO
